# Patient Record
Sex: Female | Born: 1980 | Race: Black or African American | Hispanic: No | Smoking: Never smoker
Health system: Southern US, Community
[De-identification: ages and names within clinical notes are randomized; demographics above are authoritative.]

## PROBLEM LIST (undated history)

## (undated) ENCOUNTER — Inpatient Hospital Stay (HOSPITAL_COMMUNITY): Admission: RE | Payer: BC Managed Care – PPO | Source: Ambulatory Visit

## (undated) ENCOUNTER — Inpatient Hospital Stay (HOSPITAL_COMMUNITY): Payer: Self-pay

## (undated) DIAGNOSIS — Z3490 Encounter for supervision of normal pregnancy, unspecified, unspecified trimester: Secondary | ICD-10-CM

## (undated) DIAGNOSIS — I2699 Other pulmonary embolism without acute cor pulmonale: Secondary | ICD-10-CM

## (undated) DIAGNOSIS — D649 Anemia, unspecified: Secondary | ICD-10-CM

## (undated) DIAGNOSIS — N83209 Unspecified ovarian cyst, unspecified side: Secondary | ICD-10-CM

## (undated) DIAGNOSIS — E119 Type 2 diabetes mellitus without complications: Secondary | ICD-10-CM

## (undated) DIAGNOSIS — B379 Candidiasis, unspecified: Secondary | ICD-10-CM

## (undated) DIAGNOSIS — F329 Major depressive disorder, single episode, unspecified: Secondary | ICD-10-CM

## (undated) DIAGNOSIS — I10 Essential (primary) hypertension: Secondary | ICD-10-CM

## (undated) DIAGNOSIS — R638 Other symptoms and signs concerning food and fluid intake: Secondary | ICD-10-CM

## (undated) DIAGNOSIS — Z8669 Personal history of other diseases of the nervous system and sense organs: Secondary | ICD-10-CM

## (undated) DIAGNOSIS — N943 Premenstrual tension syndrome: Secondary | ICD-10-CM

## (undated) DIAGNOSIS — A599 Trichomoniasis, unspecified: Secondary | ICD-10-CM

## (undated) DIAGNOSIS — Z8742 Personal history of other diseases of the female genital tract: Secondary | ICD-10-CM

## (undated) DIAGNOSIS — F32A Depression, unspecified: Secondary | ICD-10-CM

## (undated) DIAGNOSIS — N926 Irregular menstruation, unspecified: Secondary | ICD-10-CM

## (undated) DIAGNOSIS — A499 Bacterial infection, unspecified: Secondary | ICD-10-CM

## (undated) DIAGNOSIS — F419 Anxiety disorder, unspecified: Secondary | ICD-10-CM

## (undated) DIAGNOSIS — Z8619 Personal history of other infectious and parasitic diseases: Secondary | ICD-10-CM

## (undated) DIAGNOSIS — E559 Vitamin D deficiency, unspecified: Secondary | ICD-10-CM

## (undated) DIAGNOSIS — Z8759 Personal history of other complications of pregnancy, childbirth and the puerperium: Secondary | ICD-10-CM

## (undated) DIAGNOSIS — R51 Headache: Secondary | ICD-10-CM

## (undated) DIAGNOSIS — Z8659 Personal history of other mental and behavioral disorders: Secondary | ICD-10-CM

## (undated) HISTORY — DX: Encounter for supervision of normal pregnancy, unspecified, unspecified trimester: Z34.90

## (undated) HISTORY — DX: Major depressive disorder, single episode, unspecified: F32.9

## (undated) HISTORY — DX: Premenstrual tension syndrome: N94.3

## (undated) HISTORY — DX: Vitamin D deficiency, unspecified: E55.9

## (undated) HISTORY — DX: Personal history of other mental and behavioral disorders: Z86.59

## (undated) HISTORY — DX: Personal history of other diseases of the nervous system and sense organs: Z86.69

## (undated) HISTORY — DX: Other symptoms and signs concerning food and fluid intake: R63.8

## (undated) HISTORY — DX: Other pulmonary embolism without acute cor pulmonale: I26.99

## (undated) HISTORY — DX: Personal history of other complications of pregnancy, childbirth and the puerperium: Z87.59

## (undated) HISTORY — DX: Depression, unspecified: F32.A

## (undated) HISTORY — DX: Candidiasis, unspecified: B37.9

## (undated) HISTORY — DX: Irregular menstruation, unspecified: N92.6

## (undated) HISTORY — DX: Essential (primary) hypertension: I10

## (undated) HISTORY — DX: Unspecified ovarian cyst, unspecified side: N83.209

## (undated) HISTORY — DX: Personal history of other infectious and parasitic diseases: Z86.19

## (undated) HISTORY — DX: Morbid (severe) obesity due to excess calories: E66.01

## (undated) HISTORY — DX: Personal history of other diseases of the female genital tract: Z87.42

## (undated) HISTORY — PX: TOOTH EXTRACTION: SUR596

## (undated) HISTORY — DX: Trichomoniasis, unspecified: A59.9

## (undated) HISTORY — DX: Anxiety disorder, unspecified: F41.9

## (undated) HISTORY — DX: Headache: R51

## (undated) HISTORY — DX: Anemia, unspecified: D64.9

## (undated) HISTORY — DX: Bacterial infection, unspecified: A49.9

---

## 2009-02-26 ENCOUNTER — Emergency Department (HOSPITAL_COMMUNITY)
Admission: EM | Admit: 2009-02-26 | Discharge: 2009-02-26 | Payer: Self-pay | Source: Home / Self Care | Admitting: Emergency Medicine

## 2010-02-04 DIAGNOSIS — N926 Irregular menstruation, unspecified: Secondary | ICD-10-CM

## 2010-02-04 HISTORY — DX: Irregular menstruation, unspecified: N92.6

## 2010-04-23 LAB — BASIC METABOLIC PANEL
CO2: 26 mEq/L (ref 19–32)
Calcium: 9.2 mg/dL (ref 8.4–10.5)
Chloride: 105 mEq/L (ref 96–112)
Potassium: 3.2 mEq/L — ABNORMAL LOW (ref 3.5–5.1)
Sodium: 139 mEq/L (ref 135–145)

## 2010-04-23 LAB — RAPID URINE DRUG SCREEN, HOSP PERFORMED
Amphetamines: NOT DETECTED
Cocaine: NOT DETECTED

## 2010-04-23 LAB — DIFFERENTIAL
Basophils Absolute: 0 10*3/uL (ref 0.0–0.1)
Basophils Relative: 0 % (ref 0–1)
Eosinophils Relative: 1 % (ref 0–5)
Monocytes Absolute: 0.2 10*3/uL (ref 0.1–1.0)

## 2010-04-23 LAB — ETHANOL: Alcohol, Ethyl (B): <5 mg/dL (ref 0–10)

## 2010-04-23 LAB — CBC
Hemoglobin: 13.1 g/dL (ref 12.0–15.0)
MCHC: 33.4 g/dL (ref 30.0–36.0)
RBC: 4.47 MIL/uL (ref 3.87–5.11)

## 2010-12-11 DIAGNOSIS — E559 Vitamin D deficiency, unspecified: Secondary | ICD-10-CM

## 2010-12-11 DIAGNOSIS — Z8742 Personal history of other diseases of the female genital tract: Secondary | ICD-10-CM

## 2010-12-11 DIAGNOSIS — R638 Other symptoms and signs concerning food and fluid intake: Secondary | ICD-10-CM

## 2010-12-11 HISTORY — DX: Other symptoms and signs concerning food and fluid intake: R63.8

## 2010-12-11 HISTORY — DX: Personal history of other diseases of the female genital tract: Z87.42

## 2010-12-11 HISTORY — DX: Vitamin D deficiency, unspecified: E55.9

## 2011-02-05 DIAGNOSIS — I2699 Other pulmonary embolism without acute cor pulmonale: Secondary | ICD-10-CM

## 2011-02-05 HISTORY — DX: Other pulmonary embolism without acute cor pulmonale: I26.99

## 2011-04-09 ENCOUNTER — Encounter (INDEPENDENT_AMBULATORY_CARE_PROVIDER_SITE_OTHER): Payer: BC Managed Care – PPO | Admitting: Obstetrics and Gynecology

## 2011-04-09 DIAGNOSIS — N943 Premenstrual tension syndrome: Secondary | ICD-10-CM

## 2011-04-09 DIAGNOSIS — E559 Vitamin D deficiency, unspecified: Secondary | ICD-10-CM

## 2011-04-09 HISTORY — DX: Premenstrual tension syndrome: N94.3

## 2011-07-31 ENCOUNTER — Emergency Department (HOSPITAL_COMMUNITY): Payer: BC Managed Care – PPO

## 2011-07-31 ENCOUNTER — Inpatient Hospital Stay (HOSPITAL_COMMUNITY)
Admission: EM | Admit: 2011-07-31 | Discharge: 2011-08-02 | DRG: 886 | Disposition: A | Discharge status: Home / Self Care | Payer: BC Managed Care – PPO | Attending: Internal Medicine | Admitting: Internal Medicine

## 2011-07-31 ENCOUNTER — Encounter (HOSPITAL_COMMUNITY): Payer: Self-pay | Admitting: Emergency Medicine

## 2011-07-31 DIAGNOSIS — E669 Obesity, unspecified: Secondary | ICD-10-CM | POA: Diagnosis present

## 2011-07-31 DIAGNOSIS — O88219 Thromboembolism in pregnancy, unspecified trimester: Secondary | ICD-10-CM | POA: Diagnosis present

## 2011-07-31 DIAGNOSIS — Z6841 Body Mass Index (BMI) 40.0 and over, adult: Secondary | ICD-10-CM

## 2011-07-31 DIAGNOSIS — F329 Major depressive disorder, single episode, unspecified: Secondary | ICD-10-CM | POA: Diagnosis present

## 2011-07-31 DIAGNOSIS — R0602 Shortness of breath: Secondary | ICD-10-CM | POA: Diagnosis present

## 2011-07-31 DIAGNOSIS — Z3201 Encounter for pregnancy test, result positive: Secondary | ICD-10-CM

## 2011-07-31 DIAGNOSIS — I10 Essential (primary) hypertension: Secondary | ICD-10-CM

## 2011-07-31 DIAGNOSIS — O24919 Unspecified diabetes mellitus in pregnancy, unspecified trimester: Secondary | ICD-10-CM | POA: Diagnosis present

## 2011-07-31 DIAGNOSIS — R0682 Tachypnea, not elsewhere classified: Secondary | ICD-10-CM

## 2011-07-31 DIAGNOSIS — E282 Polycystic ovarian syndrome: Secondary | ICD-10-CM | POA: Diagnosis present

## 2011-07-31 DIAGNOSIS — E119 Type 2 diabetes mellitus without complications: Secondary | ICD-10-CM | POA: Diagnosis present

## 2011-07-31 DIAGNOSIS — R071 Chest pain on breathing: Secondary | ICD-10-CM

## 2011-07-31 DIAGNOSIS — R000 Tachycardia, unspecified: Secondary | ICD-10-CM

## 2011-07-31 DIAGNOSIS — F3289 Other specified depressive episodes: Secondary | ICD-10-CM | POA: Diagnosis present

## 2011-07-31 DIAGNOSIS — I2699 Other pulmonary embolism without acute cor pulmonale: Secondary | ICD-10-CM

## 2011-07-31 DIAGNOSIS — O99891 Other specified diseases and conditions complicating pregnancy: Principal | ICD-10-CM | POA: Diagnosis present

## 2011-07-31 DIAGNOSIS — O169 Unspecified maternal hypertension, unspecified trimester: Secondary | ICD-10-CM | POA: Diagnosis present

## 2011-07-31 LAB — BASIC METABOLIC PANEL
BUN: 7 mg/dL (ref 6–23)
CO2: 19 mEq/L (ref 19–32)
Chloride: 105 mEq/L (ref 96–112)
GFR calc non Af Amer: >90 mL/min (ref >90)
Glucose, Bld: 135 mg/dL — ABNORMAL HIGH (ref 70–99)
Potassium: 3.2 mEq/L — ABNORMAL LOW (ref 3.5–5.1)
Sodium: 137 mEq/L (ref 135–145)

## 2011-07-31 LAB — URINE MICROSCOPIC-ADD ON

## 2011-07-31 LAB — CBC WITH DIFFERENTIAL/PLATELET
Basophils Absolute: 0 10*3/uL (ref 0.0–0.1)
Basophils Relative: 0 % (ref 0–1)
Eosinophils Absolute: 0 10*3/uL (ref 0.0–0.7)
HCT: 35.3 % — ABNORMAL LOW (ref 36.0–46.0)
MCV: 85.5 fL (ref 78.0–100.0)
Monocytes Absolute: 0.3 10*3/uL (ref 0.1–1.0)
Monocytes Relative: 6 % (ref 3–12)
Neutro Abs: 2.9 10*3/uL (ref 1.7–7.7)
Platelets: 236 10*3/uL (ref 150–400)
RBC: 4.13 MIL/uL (ref 3.87–5.11)
WBC: 5.1 10*3/uL (ref 4.0–10.5)

## 2011-07-31 LAB — URINALYSIS, ROUTINE W REFLEX MICROSCOPIC
Protein, ur: 30 mg/dL — AB
Urobilinogen, UA: 1 mg/dL (ref 0.0–1.0)

## 2011-07-31 LAB — HCG, QUANTITATIVE, PREGNANCY: hCG, Beta Chain, Quant, S: 1389 m[IU]/mL — ABNORMAL HIGH (ref <5)

## 2011-07-31 LAB — GLUCOSE, CAPILLARY

## 2011-07-31 MED ORDER — ENOXAPARIN SODIUM 120 MG/0.8ML ~~LOC~~ SOLN
120.0000 mg | Freq: Two times a day (BID) | SUBCUTANEOUS | Status: DC
  Administered 2011-07-31: 120 mg via SUBCUTANEOUS
  Filled 2011-07-31 (×3): qty 0.8

## 2011-07-31 MED ORDER — IOHEXOL 350 MG/ML SOLN
100.0000 mL | Freq: Once | INTRAVENOUS | Status: AC | PRN
  Administered 2011-07-31: 100 mL via INTRAVENOUS

## 2011-07-31 MED ORDER — SODIUM CHLORIDE 0.9 % IJ SOLN: 3.0000 mL | Freq: Two times a day (BID) | INTRAMUSCULAR | Status: DC

## 2011-07-31 MED ORDER — SODIUM CHLORIDE 0.9 % IV SOLN
INTRAVENOUS | Status: DC
  Administered 2011-07-31 – 2011-08-02 (×4): via INTRAVENOUS

## 2011-07-31 MED ORDER — MORPHINE SULFATE 2 MG/ML IJ SOLN: 2.0000 mg | INTRAMUSCULAR | Status: DC | PRN

## 2011-07-31 NOTE — ED Notes (Signed)
Admitting MD at bedside.

## 2011-07-31 NOTE — ED Notes (Signed)
Doppler being performed at bedside.

## 2011-07-31 NOTE — ED Provider Notes (Signed)
History     CSN: [id number]  Arrival date & time 07/31/11  1452   First MD Initiated Contact with Patient 07/31/11 1601      Chief Complaint  Patient presents with  . Possible DVT    . Shortness of Breath    HPI Pt was seen at 1600.  Per pt, c/o gradual onset and persistence of constant RLE "pain" that began approx 4 days ago.  Pt states she took a 3-4 hour car trip to [place] last Thursday (6 days ago) and her symptoms started 2 days after that.  Pt describes the RLE pain as her calf being "tight."  States she has also developed SOB and chest "pain" when she breathes.  Pt was eval by her PMD PTA, sent to the ED for further eval.  Denies palpitations, no cough, no fevers, no back pain, no abd pain, no N/V/D, no rash.    Past Medical History  Diagnosis Date  . Diabetes mellitus     History reviewed. No pertinent past surgical history.   History  Substance Use Topics  . Smoking status: Never Smoker   . Smokeless tobacco: Not on file  . Alcohol Use: Yes     socially    Review of Systems ROS: Statement: All systems negative except as marked or noted in the HPI; Constitutional: Negative for fever and chills. ; ; Eyes: Negative for eye pain, redness and discharge. ; ; ENMT: Negative for ear pain, hoarseness, nasal congestion, sinus pressure and sore throat. ; ; Cardiovascular: +SOB, CP. Negative for palpitations, diaphoresis, and peripheral edema. ; ; Respiratory: Negative for cough, wheezing and stridor. ; ; Gastrointestinal: Negative for nausea, vomiting, diarrhea, abdominal pain, blood in stool, hematemesis, jaundice and rectal bleeding. . ; ; Genitourinary: Negative for dysuria, flank pain and hematuria. ; ; Musculoskeletal: +right calf pain. Negative for back pain and neck pain. Negative for trauma.; ; Skin: Negative for pruritus, rash, abrasions, blisters, bruising and skin lesion.; ; Neuro: Negative for headache, lightheadedness and neck stiffness. Negative for weakness, altered  level of consciousness , altered mental status, extremity weakness, paresthesias, involuntary movement, seizure and syncope.     Allergies  Review of patient's allergies indicates no known allergies.  Home Medications   Current Outpatient Rx  Name Route Sig Dispense Refill  . FLUOXETINE HCL 20 MG PO TABS Oral Take 20 mg by mouth daily.    Marland Kitchen METFORMIN HCL 500 MG PO TABS Oral Take 500 mg by mouth 3 (three) times daily.      BP 161/93  Pulse 111  SpO2 98%  LMP 06/24/2011  Physical Exam 1605: Physical examination:  Nursing notes reviewed; Vital signs and O2 SAT reviewed;  Constitutional: Well developed, Well nourished, Well hydrated, In no acute distress; Head:  Normocephalic, atraumatic; Eyes: EOMI, PERRL, No scleral icterus; ENMT: Mouth and pharynx normal, Mucous membranes moist; Neck: Supple, Full range of motion, No lymphadenopathy; Cardiovascular: Tachycardic rate and rhythm, No gallop; Respiratory: Breath sounds clear & equal bilaterally, No wheezes.  Speaking full sentences with ease, Normal respiratory effort/excursion; Chest: Nontender, Movement normal; Abdomen: Soft, Nontender, Nondistended, Normal bowel sounds;; Extremities: Pulses normal, +right calf TTP without erythema, edema or ecchymosis.  RLE muscle compartments soft.  NT right knee/ankle/foot.  No calf edema or asymmetry.; Neuro: AA&Ox3, Major CN grossly intact.  Speech clear. No gross focal motor or sensory deficits in extremities.; Skin: Color normal, Warm, Dry.   ED Course  Procedures   MDM  MDM Reviewed: nursing note and  vitals Reviewed previous: ECG Interpretation: labs, ECG, x-ray and CT scan    Date: 07/31/2011  Rate: 104  Rhythm: sinus tachycardia  QRS Axis: normal  Intervals: normal  ST/T Wave abnormalities: normal  Conduction Disutrbances:none  Narrative Interpretation:   Old EKG Reviewed: unchanged; no significant changes from previous EKG dated 02/26/2009.  Results for orders placed during the  hospital encounter of 07/31/11  BASIC METABOLIC PANEL      Component Value Range   Sodium 137  135 - 145 mEq/L   Potassium 3.2 (*) 3.5 - 5.1 mEq/L   Chloride 105  96 - 112 mEq/L   CO2 19  19 - 32 mEq/L   Glucose, Bld 135 (*) 70 - 99 mg/dL   BUN 7  6 - 23 mg/dL   Creatinine, Ser 2.95  0.50 - 1.10 mg/dL   Calcium 9.4  8.4 - 62.1 mg/dL   GFR calc non Af Amer >90  >90 mL/min   GFR calc Af Amer >90  >90 mL/min  CBC WITH DIFFERENTIAL      Component Value Range   WBC 5.1  4.0 - 10.5 K/uL   RBC 4.13  3.87 - 5.11 MIL/uL   Hemoglobin 11.4 (*) 12.0 - 15.0 g/dL   HCT 30.8 (*) 65.7 - 84.6 %   MCV 85.5  78.0 - 100.0 fL   MCH 27.6  26.0 - 34.0 pg   MCHC 32.3  30.0 - 36.0 g/dL   RDW 96.2  95.2 - 84.1 %   Platelets 236  150 - 400 K/uL   Neutrophils Relative 56  43 - 77 %   Neutro Abs 2.9  1.7 - 7.7 K/uL   Lymphocytes Relative 37  12 - 46 %   Lymphs Abs 1.9  0.7 - 4.0 K/uL   Monocytes Relative 6  3 - 12 %   Monocytes Absolute 0.3  0.1 - 1.0 K/uL   Eosinophils Relative 0  0 - 5 %   Eosinophils Absolute 0.0  0.0 - 0.7 K/uL   Basophils Relative 0  0 - 1 %   Basophils Absolute 0.0  0.0 - 0.1 K/uL  URINALYSIS, ROUTINE W REFLEX MICROSCOPIC      Component Value Range   Color, Urine YELLOW  YELLOW   APPearance CLOUDY (*) CLEAR   Specific Gravity, Urine 1.035 (*) 1.005 - 1.030   pH 5.5  5.0 - 8.0   Glucose, UA NEGATIVE  NEGATIVE mg/dL   Hgb urine dipstick NEGATIVE  NEGATIVE   Bilirubin Urine SMALL (*) NEGATIVE   Ketones, ur TRACE (*) NEGATIVE mg/dL   Protein, ur 30 (*) NEGATIVE mg/dL   Urobilinogen, UA 1.0  0.0 - 1.0 mg/dL   Nitrite NEGATIVE  NEGATIVE   Leukocytes, UA SMALL (*) NEGATIVE  PREGNANCY, URINE      Component Value Range   Preg Test, Ur POSITIVE (*) NEGATIVE  GLUCOSE, CAPILLARY      Component Value Range   Glucose-Capillary 143 (*) 70 - 99 mg/dL   Comment 1 Documented in Chart     Comment 2 Notify RN    URINE MICROSCOPIC-ADD ON      Component Value Range   Squamous Epithelial  / LPF RARE  RARE   WBC, UA 3-6  <3 WBC/hpf   RBC / HPF 0-2  <3 RBC/hpf   Bacteria, UA RARE  RARE   Crystals CA OXALATE CRYSTALS (*) NEGATIVE  HCG, QUANTITATIVE, PREGNANCY      Component Value Range   hCG, Beta  Chain, Sharene Butters, S 1389 (*) <5 mIU/mL  TROPONIN I      Component Value Range   Troponin I <0.30  <0.30 ng/mL   Dg Chest 2 View 07/31/2011  *RADIOLOGY REPORT*  Clinical Data: Cough, calf pain  CHEST - 2 VIEW  Comparison:  None.  Findings:  The heart size and mediastinal contours are within normal limits.  Both lungs are clear.  The visualized skeletal structures are unremarkable.  IMPRESSION: No active cardiopulmonary disease.  Original Report Authenticated By: Judie Petit. Ruel Favors, M.D.   Ct Angio Chest W/cm &/or Wo Cm 07/31/2011  *RADIOLOGY REPORT*  Clinical Data: Leg pain.  Swelling in the calf.  Shortness of breath.  Pregnancy.  CT ANGIOGRAPHY CHEST  Technique:  Multidetector CT imaging of the chest using the standard protocol during bolus administration of intravenous contrast. Multiplanar reconstructed images including MIPs were obtained and reviewed to evaluate the vascular anatomy. Abdominal shielding was utilized further reduces fetal exposure.  Contrast: OMNIPAQUE IOHEXOL 350 MG/ML SOLN  Comparison: 07/31/2011  Findings: Patient body habitus and delayed contrast bolus are noted, with various patchy hypodensities in the pulmonary arteries of both lower lobes which seem artifactual but which dramatically reduced sensitivity for acute pulmonary embolus.  Because of the patient's pregnancy, repeat radiation exposure is not an option. The contrast bolus is better in the upper lobes, and no upper lobe embolus is seen.  The lungs appear clear.  An explanation for shortness of breath is not seen.  No thoracic adenopathy is observed.  IMPRESSION:  1.  Sensitivity of today's exam is markedly low due to patient body habitus and late bolus resulting in heterogeneous contrast density in the lower lobe  pulmonary arteries.  Some of the hypodensities in the pulmonary arteries, such as in the left lower lobe pulmonary artery on image 38 of series 4, are clearly artifactual.  Some of the hypodensities, particularly in the right lower lobe, could conceivably represent small pulmonary emboli, but are nonspecific. Rescanning the patient with different timing is undesirable from the perspective of radiation exposure to her pregnancy.  If not already performed, lower extremity Doppler examination would be recommended to provide supplementary information. Although overall I doubt that there is acute pulmonary embolus, this particular exam cannot provide diagnostic certitude based on the limitations described above.  Original Report Authenticated By: Dellia Cloud, M.D.   Bilat LE Vasc US negative for DVT.    2100:  Very long d/w pt and multiple family members regarding obtaining CT-A chest to r/o PE (smaller radiation risk to fetus compared to V/Q scan), as she is pregnant (radiation risk to fetus) but is high risk for PE per Wells' criteria (pt does not meet PERC criteria and d-dimer not indicated).  Pt continues tachycardic and tachypneic, but Sats remain 98% R/A.  EKG and troponin normal.  Pt agreed to CT-A, with equivocal results as above; will not re-scan.  Given high risk for PE per Wells', will admit to start anticoagulants.  Dx testing d/w pt and family.  Questions answered.  Verb understanding, agreeable to admit. T/C to [facility] Dr. Conley Rolls, case discussed, including:  HPI, pertinent PM/SHx, VS/PE, dx testing, ED course and treatment:  Agreeable to come to ED for eval to admit.           Laray Anger, DO 08/01/11 2127

## 2011-07-31 NOTE — Progress Notes (Signed)
VASCULAR LAB PRELIMINARY  PRELIMINARY  PRELIMINARY  PRELIMINARY  Right lower extremity venous duplex completed.    Preliminary report:  Right:  No evidence of DVT, superficial thrombosis, or Baker's cyst. NEGATIVE  Heather Vargas,  RVS 07/31/2011, 4:49 PM

## 2011-07-31 NOTE — H&P (Signed)
PCP:   POWELL,ELMIRA, PA   Chief Complaint: Left calf swelling and pain, acute shortness of breath, pleuritic left upper chest pain.   HPI: Heather Vargas is an 31 y.o. female with history of PCOS on metformin, depression on Prozac, obesity, who has been in her usual state of health walking daily without any problem, presents to her company healthcare provider at [employer], complaining of left calf tenderness and swelling, along with acute onset of moderate shortness of breath, and pleuritic left upper chest pain. She said the symptoms are moderate to severe, and with acute onset. In emergency room, she was found to have a positive pregnancy test. After consideration, because of her elevated risks for PE, she accepted the CT pulmonary angiogram risks to her fetus, and proceed to have this test done. Unfortunately he was nondiagnostic, and there was some changes that may represent small pulmonary embolism. No d-dimer was performed, as it likely would be elevated due to her pregnancy, and her Wells risk criteria preclude this test. The Doppler of the lower extremities was negative for any DVT. When I evaluate her, her blood pressure was okay, but her heart rate was 110-115. Hospitalist was asked to admit her for further evaluation of her possible pulmonary embolism in the setting of pregnancy.  Rewiew of Systems:  The patient denies anorexia, fever, weight loss,, vision loss, decreased hearing, hoarseness,, syncope,balance deficits, hemoptysis, abdominal pain, melena, hematochezia, severe indigestion/heartburn, hematuria, incontinence, genital sores, muscle weakness, suspicious skin lesions, transient blindness, difficulty walking, depression, unusual weight change, abnormal bleeding, enlarged lymph nodes, angioedema, and breast masses.   Past Medical History  Diagnosis Date  . Diabetes mellitus     Past Surgical History  Procedure Date  . No past surgeries     Medications:  HOME  MEDS: Prior to Admission medications   Medication Sig Start Date End Date Taking? Authorizing Provider  FLUoxetine (PROZAC) 20 MG tablet Take 20 mg by mouth daily.   Yes Historical Provider, MD  metFORMIN (GLUCOPHAGE) 500 MG tablet Take 500 mg by mouth 3 (three) times daily.   Yes Historical Provider, MD     Allergies:  No Known Allergies  Social History:   reports that she has never smoked. She has never used smokeless tobacco. She reports that she drinks alcohol. She reports that she does not use illicit drugs.  Family History: History reviewed. No pertinent family history.   Physical Exam: Filed Vitals:   07/31/11 1549 07/31/11 1947 07/31/11 2231  BP: 161/93 142/87   Pulse: 111    TempSrc: Oral    Resp:  13   Height:   5\' 2"  (1.575 m)  Weight:   117.935 kg (260 lb)  SpO2: 98% 100%    Blood pressure 142/87, pulse 111, resp. rate 13, height 5\' 2"  (1.575 m), weight 117.935 kg (260 lb), last menstrual period 06/24/2011, SpO2 100.00%.  GEN:  Pleasant and obese person lying in the stretcher in no acute distress; cooperative with exam PSYCH:  alert and oriented x4; does not appear anxious or depressed; affect is appropriate. HEENT: Mucous membranes pink and anicteric; PERRLA; EOM intact; no cervical lymphadenopathy nor thyromegaly or carotid bruit; no JVD; Breasts:: Not examined CHEST WALL: No tenderness CHEST: Normal respiration, clear to auscultation bilaterally. No wheezing HEART: Rapid rate and regular rhythm; no murmurs rubs or gallops BACK: No kyphosis or scoliosis; no CVA tenderness ABDOMEN: Obese, soft non-tender; no masses, no organomegaly, normal abdominal bowel sounds; no pannus; no intertriginous candida. Rectal Exam: Not  done EXTREMITIES: No bone or joint deformity; age-appropriate arthropathy of the hands and knees; no edema; no ulcerations. Genitalia: not examined PULSES: 2+ and symmetric SKIN: Normal hydration no rash or ulceration CNS: Cranial nerves 2-12  grossly intact no focal lateralizing neurologic deficit   Labs & Imaging Results for orders placed during the hospital encounter of 07/31/11 (from the past 48 hour(s))  HCG, QUANTITATIVE, PREGNANCY     Status: Abnormal   Collection Time   07/31/11  4:09 PM      Component Value Range Comment   hCG, Beta Chain, Quant, S 1389 (*) <5 mIU/mL   TROPONIN I     Status: Normal   Collection Time   07/31/11  4:09 PM      Component Value Range Comment   Troponin I <0.30  <0.30 ng/mL   GLUCOSE, CAPILLARY     Status: Abnormal   Collection Time   07/31/11  4:53 PM      Component Value Range Comment   Glucose-Capillary 143 (*) 70 - 99 mg/dL    Comment 1 Documented in Chart      Comment 2 Notify RN     BASIC METABOLIC PANEL     Status: Abnormal   Collection Time   07/31/11  4:56 PM      Component Value Range Comment   Sodium 137  135 - 145 mEq/L    Potassium 3.2 (*) 3.5 - 5.1 mEq/L    Chloride 105  96 - 112 mEq/L    CO2 19  19 - 32 mEq/L    Glucose, Bld 135 (*) 70 - 99 mg/dL    BUN 7  6 - 23 mg/dL    Creatinine, Ser 8.29  0.50 - 1.10 mg/dL    Calcium 9.4  8.4 - 56.2 mg/dL    GFR calc non Af Amer >90  >90 mL/min    GFR calc Af Amer >90  >90 mL/min   CBC WITH DIFFERENTIAL     Status: Abnormal   Collection Time   07/31/11  4:56 PM      Component Value Range Comment   WBC 5.1  4.0 - 10.5 K/uL    RBC 4.13  3.87 - 5.11 MIL/uL    Hemoglobin 11.4 (*) 12.0 - 15.0 g/dL    HCT 13.0 (*) 86.5 - 46.0 %    MCV 85.5  78.0 - 100.0 fL    MCH 27.6  26.0 - 34.0 pg    MCHC 32.3  30.0 - 36.0 g/dL    RDW 78.4  69.6 - 29.5 %    Platelets 236  150 - 400 K/uL    Neutrophils Relative 56  43 - 77 %    Neutro Abs 2.9  1.7 - 7.7 K/uL    Lymphocytes Relative 37  12 - 46 %    Lymphs Abs 1.9  0.7 - 4.0 K/uL    Monocytes Relative 6  3 - 12 %    Monocytes Absolute 0.3  0.1 - 1.0 K/uL    Eosinophils Relative 0  0 - 5 %    Eosinophils Absolute 0.0  0.0 - 0.7 K/uL    Basophils Relative 0  0 - 1 %    Basophils Absolute  0.0  0.0 - 0.1 K/uL   URINALYSIS, ROUTINE W REFLEX MICROSCOPIC     Status: Abnormal   Collection Time   07/31/11  4:56 PM      Component Value Range Comment   Color, Urine YELLOW  YELLOW    APPearance CLOUDY (*) CLEAR    Specific Gravity, Urine 1.035 (*) 1.005 - 1.030    pH 5.5  5.0 - 8.0    Glucose, UA NEGATIVE  NEGATIVE mg/dL    Hgb urine dipstick NEGATIVE  NEGATIVE    Bilirubin Urine SMALL (*) NEGATIVE    Ketones, ur TRACE (*) NEGATIVE mg/dL    Protein, ur 30 (*) NEGATIVE mg/dL    Urobilinogen, UA 1.0  0.0 - 1.0 mg/dL    Nitrite NEGATIVE  NEGATIVE    Leukocytes, UA SMALL (*) NEGATIVE   PREGNANCY, URINE     Status: Abnormal   Collection Time   07/31/11  4:56 PM      Component Value Range Comment   Preg Test, Ur POSITIVE (*) NEGATIVE   URINE MICROSCOPIC-ADD ON     Status: Abnormal   Collection Time   07/31/11  4:56 PM      Component Value Range Comment   Squamous Epithelial / LPF RARE  RARE    WBC, UA 3-6  <3 WBC/hpf    RBC / HPF 0-2  <3 RBC/hpf    Bacteria, UA RARE  RARE    Crystals CA OXALATE CRYSTALS (*) NEGATIVE    Dg Chest 2 View  07/31/2011  *RADIOLOGY REPORT*  Clinical Data: Cough, calf pain  CHEST - 2 VIEW  Comparison:  None.  Findings:  The heart size and mediastinal contours are within normal limits.  Both lungs are clear.  The visualized skeletal structures are unremarkable.  IMPRESSION: No active cardiopulmonary disease.  Original Report Authenticated By: Judie Petit. Ruel Favors, M.D.   Ct Angio Chest W/cm &/or Wo Cm  07/31/2011  *RADIOLOGY REPORT*  Clinical Data: Leg pain.  Swelling in the calf.  Shortness of breath.  Pregnancy.  CT ANGIOGRAPHY CHEST  Technique:  Multidetector CT imaging of the chest using the standard protocol during bolus administration of intravenous contrast. Multiplanar reconstructed images including MIPs were obtained and reviewed to evaluate the vascular anatomy. Abdominal shielding was utilized further reduces fetal exposure.  Contrast: OMNIPAQUE  IOHEXOL 350 MG/ML SOLN  Comparison: 07/31/2011  Findings: Patient body habitus and delayed contrast bolus are noted, with various patchy hypodensities in the pulmonary arteries of both lower lobes which seem artifactual but which dramatically reduced sensitivity for acute pulmonary embolus.  Because of the patient's pregnancy, repeat radiation exposure is not an option. The contrast bolus is better in the upper lobes, and no upper lobe embolus is seen.  The lungs appear clear.  An explanation for shortness of breath is not seen.  No thoracic adenopathy is observed.  IMPRESSION:  1.  Sensitivity of today's exam is markedly low due to patient body habitus and late bolus resulting in heterogeneous contrast density in the lower lobe pulmonary arteries.  Some of the hypodensities in the pulmonary arteries, such as in the left lower lobe pulmonary artery on image 38 of series 4, are clearly artifactual.  Some of the hypodensities, particularly in the right lower lobe, could conceivably represent small pulmonary emboli, but are nonspecific. Rescanning the patient with different timing is undesirable from the perspective of radiation exposure to her pregnancy.  If not already performed, lower extremity Doppler examination would be recommended to provide supplementary information. Although overall I doubt that there is acute pulmonary embolus, this particular exam cannot provide diagnostic certitude based on the limitations described above.  Original Report Authenticated By: Dellia Cloud, M.D.      Assessment Present on  Admission:  .Chest pain on breathing .SOB (shortness of breath) .PCOS (polycystic ovarian syndrome)   PLAN:  Her clinical story is very concerning for possible pulmonary embolism. Unfortunately her CT pulmonary angiogram wasn't conclusive. Her mother and she are discussing about whether or not to continue with her pregnancy. She is still equivocal about this. I have recommended that she be  admitted to the hospital, and start on Lovenox. Lovenox has lower incidence of bleeding in the fetus and is category B. IV heparin  is category C. and obviously, Coumadin is category X. (contraindication in pregnancy due to fetal bleed and its teratogenicity).  I spoke with the radiologist as well, and was informed that a ventilation/perfusion scan would give more radiation risk to the fetus. If a more definitive diagnosis is sought, then he recommended to repeat a CT pulmonary angiogram in 1-2 days, if her creatinine is in acceptable range.  This should be in conjunction with patient understanding of repeating a CT pulmonary angiogram cause an increased risk to her fetus.  This is balanced against the risk of anticoagulation especially if she did not have a PE.  Again, her clinical history put her in high risk for pulmonary embolism. She is stable, full code, and will be admitted to [facility].   Other plans as per orders.    Heather Vargas 07/31/2011, 11:06 PM

## 2011-07-31 NOTE — Progress Notes (Signed)
VASCULAR LAB PRELIMINARY  PRELIMINARY  PRELIMINARY  PRELIMINARY  Left lower extremity venous duplex completed.    Preliminary report: Left leg is negative for deep and superficial vein thrombosis.   Heather Vargas,   RVT 07/31/2011, 9:04 PM

## 2011-07-31 NOTE — ED Notes (Signed)
Vascular tech at [facility]. Coming to see pt shortly.

## 2011-07-31 NOTE — ED Notes (Signed)
Pt sent over from MD at [facility] to be evaluated for possible DVT. Pt states that she was on a 3-4 hour trip to [place] and leg has been in pain since. Notice swelling in calf area. Also complains of SOB.

## 2011-08-01 DIAGNOSIS — R071 Chest pain on breathing: Secondary | ICD-10-CM

## 2011-08-01 DIAGNOSIS — R0602 Shortness of breath: Secondary | ICD-10-CM

## 2011-08-01 DIAGNOSIS — I517 Cardiomegaly: Secondary | ICD-10-CM

## 2011-08-01 DIAGNOSIS — I10 Essential (primary) hypertension: Secondary | ICD-10-CM

## 2011-08-01 DIAGNOSIS — Z3201 Encounter for pregnancy test, result positive: Secondary | ICD-10-CM

## 2011-08-01 LAB — CBC
Hemoglobin: 10.7 g/dL — ABNORMAL LOW (ref 12.0–15.0)
MCHC: 32.5 g/dL (ref 30.0–36.0)
RBC: 3.83 MIL/uL — ABNORMAL LOW (ref 3.87–5.11)

## 2011-08-01 LAB — GLUCOSE, CAPILLARY
Glucose-Capillary: 103 mg/dL — ABNORMAL HIGH (ref 70–99)
Glucose-Capillary: 112 mg/dL — ABNORMAL HIGH (ref 70–99)
Glucose-Capillary: 116 mg/dL — ABNORMAL HIGH (ref 70–99)
Glucose-Capillary: 118 mg/dL — ABNORMAL HIGH (ref 70–99)

## 2011-08-01 LAB — BASIC METABOLIC PANEL
GFR calc Af Amer: >90 mL/min (ref >90)
GFR calc non Af Amer: >90 mL/min (ref >90)
Potassium: 3 mEq/L — ABNORMAL LOW (ref 3.5–5.1)
Sodium: 137 mEq/L (ref 135–145)

## 2011-08-01 LAB — HEMOGLOBIN A1C: Hgb A1c MFr Bld: 6 % — ABNORMAL HIGH (ref <5.7)

## 2011-08-01 MED ORDER — ZOLPIDEM TARTRATE 5 MG PO TABS
5.0000 mg | ORAL_TABLET | Freq: Every evening | ORAL | Status: DC | PRN
  Administered 2011-08-01: 5 mg via ORAL
  Filled 2011-08-01: qty 1

## 2011-08-01 MED ORDER — ACETAMINOPHEN 325 MG PO TABS
650.0000 mg | ORAL_TABLET | Freq: Four times a day (QID) | ORAL | Status: DC | PRN
  Administered 2011-08-01 (×3): 650 mg via ORAL
  Filled 2011-08-01 (×3): qty 2

## 2011-08-01 MED ORDER — POTASSIUM CHLORIDE CRYS ER 20 MEQ PO TBCR
40.0000 meq | EXTENDED_RELEASE_TABLET | ORAL | Status: AC
  Administered 2011-08-01 (×2): 40 meq via ORAL
  Filled 2011-08-01 (×2): qty 2

## 2011-08-01 MED ORDER — LABETALOL HCL 100 MG PO TABS
100.0000 mg | ORAL_TABLET | Freq: Two times a day (BID) | ORAL | Status: DC
  Administered 2011-08-01 – 2011-08-02 (×3): 100 mg via ORAL
  Filled 2011-08-01 (×5): qty 1

## 2011-08-01 MED ORDER — ENOXAPARIN SODIUM 150 MG/ML ~~LOC~~ SOLN
130.0000 mg | Freq: Two times a day (BID) | SUBCUTANEOUS | Status: DC
  Administered 2011-08-01 – 2011-08-02 (×3): 130 mg via SUBCUTANEOUS
  Filled 2011-08-01 (×4): qty 1

## 2011-08-01 MED ORDER — ENOXAPARIN (LOVENOX) PATIENT EDUCATION KIT
PACK | Freq: Once | Status: AC
  Administered 2011-08-01: 20:00:00
  Filled 2011-08-01: qty 1

## 2011-08-01 MED ORDER — INSULIN ASPART 100 UNIT/ML ~~LOC~~ SOLN: 0.0000 [IU] | Freq: Three times a day (TID) | SUBCUTANEOUS | Status: DC

## 2011-08-01 MED ORDER — ENSURE COMPLETE PO LIQD
237.0000 mL | Freq: Two times a day (BID) | ORAL | Status: DC
  Administered 2011-08-01 – 2011-08-02 (×2): 237 mL via ORAL

## 2011-08-01 NOTE — Progress Notes (Signed)
[facility] PROGRESS NOTE  Heather Keyworth ZOX:[id number] DOB: [birth year] DOA: 07/31/2011 PCP: Henreitta Leber, PA  Brief narrative: Heather Vargas is an 31 y.o. female with history of PCOS on metformin, depression on Prozac, obesity, who has been in her usual state of health walking daily without any problem presents 07/31/11 complaining of left calf tenderness and swelling, along with acute onset of moderate shortness of breath, and pleuritic left upper chest pain.  In emergency room, she was found to have a positive pregnancy test. After consideration, because of her elevated risks for PE, she accepted the CT pulmonary angiogram risks to her fetus, and proceed to have this test done. Unfortunately it was nondiagnostic, and there was some changes that may represent small pulmonary embolism. No d-dimer was performed, as it likely would be elevated due to her pregnancy, and her Wells risk criteria preclude this test. The Doppler of the lower extremities was negative for any DVT. Admitted to tele for  further evaluation of her possible pulmonary embolism in the setting of pregnancy   Consultants:    Procedures:    Antibiotics:    HPI/Subjective: Awake alert. Sitting up in bed. Denies CP/SOB at rest. No events during night. Complained rt calf "tightness"  Objective: Filed Vitals:   07/31/11 2231 07/31/11 2313 07/31/11 2340 08/01/11 0509  BP:  137/89 152/103 148/99  Pulse:  112 110 100  Temp:  98.8 F (37.1 C) 98.1 F (36.7 C) 98.2 F (36.8 C)  TempSrc:  Oral Oral Oral  Resp:  20 20 18   Height: 5\' 2"  (1.575 m)  5\' 2"  (1.575 m)   Weight: 117.935 kg (260 lb)  130.545 kg (287 lb 12.8 oz)   SpO2:  98% 97% 96%    Intake/Output Summary (Last 24 hours) at 08/01/11 0957 Last data filed at 08/01/11 0851  Gross per 24 hour  Intake    480 ml  Output    150 ml  Net    330 ml    Exam:   General:  Awake, alert oriented x3. Well nourished NAD  Cardiovascular: tachycardic, regular,  no MGR trace LEE PPP  Respiratory: normal effort. BSCTAB, no wheeze, rhonci  Abdomen: obese, soft, +BS. Non-tender to palpation  Data Reviewed: Basic Metabolic Panel:  Lab 08/01/11 8119 07/31/11 1656  NA 137 137  K 3.0* 3.2*  CL 106 105  CO2 20 19  GLUCOSE 107* 135*  BUN 5* 7  CREATININE 0.70 0.76  CALCIUM 8.7 9.4  MG -- --  PHOS -- --   Liver Function Tests: No results found for this basename: AST:5,ALT:5,ALKPHOS:5,BILITOT:5,PROT:5,ALBUMIN:5 in the last 168 hours No results found for this basename: LIPASE:5,AMYLASE:5 in the last 168 hours No results found for this basename: AMMONIA:5 in the last 168 hours CBC:  Lab 08/01/11 0420 07/31/11 1656  WBC 5.2 5.1  NEUTROABS -- 2.9  HGB 10.7* 11.4*  HCT 32.9* 35.3*  MCV 85.9 85.5  PLT 210 236   Cardiac Enzymes:  Lab 07/31/11 1609  CKTOTAL --  CKMB --  CKMBINDEX --  TROPONINI <0.30   BNP: No components found with this basename: POCBNP:5 CBG:  Lab 08/01/11 0025 07/31/11 1653  GLUCAP 116* 143*    No results found for this or any previous visit (from the past 240 hour(s)).   Studies: Dg Chest 2 View  07/31/2011  *RADIOLOGY REPORT*  Clinical Data: Cough, calf pain  CHEST - 2 VIEW  Comparison:  None.  Findings:  The heart size and mediastinal contours are within normal  limits.  Both lungs are clear.  The visualized skeletal structures are unremarkable.  IMPRESSION: No active cardiopulmonary disease.  Original Report Authenticated By: Judie Petit. Ruel Favors, M.D.   Ct Angio Chest W/cm &/or Wo Cm  07/31/2011  *RADIOLOGY REPORT*  Clinical Data: Leg pain.  Swelling in the calf.  Shortness of breath.  Pregnancy.  CT ANGIOGRAPHY CHEST  Technique:  Multidetector CT imaging of the chest using the standard protocol during bolus administration of intravenous contrast. Multiplanar reconstructed images including MIPs were obtained and reviewed to evaluate the vascular anatomy. Abdominal shielding was utilized further reduces fetal exposure.   Contrast: OMNIPAQUE IOHEXOL 350 MG/ML SOLN  Comparison: 07/31/2011  Findings: Patient body habitus and delayed contrast bolus are noted, with various patchy hypodensities in the pulmonary arteries of both lower lobes which seem artifactual but which dramatically reduced sensitivity for acute pulmonary embolus.  Because of the patient's pregnancy, repeat radiation exposure is not an option. The contrast bolus is better in the upper lobes, and no upper lobe embolus is seen.  The lungs appear clear.  An explanation for shortness of breath is not seen.  No thoracic adenopathy is observed.  IMPRESSION:  1.  Sensitivity of today's exam is markedly low due to patient body habitus and late bolus resulting in heterogeneous contrast density in the lower lobe pulmonary arteries.  Some of the hypodensities in the pulmonary arteries, such as in the left lower lobe pulmonary artery on image 38 of series 4, are clearly artifactual.  Some of the hypodensities, particularly in the right lower lobe, could conceivably represent small pulmonary emboli, but are nonspecific. Rescanning the patient with different timing is undesirable from the perspective of radiation exposure to her pregnancy.  If not already performed, lower extremity Doppler examination would be recommended to provide supplementary information. Although overall I doubt that there is acute pulmonary embolus, this particular exam cannot provide diagnostic certitude based on the limitations described above.  Original Report Authenticated By: Dellia Cloud, M.D.    Scheduled Meds:   . enoxaparin (LOVENOX) injection  130 mg Subcutaneous BID  . potassium chloride  40 mEq Oral Q4H  . sodium chloride  3 mL Intravenous Q12H  . DISCONTD: enoxaparin (LOVENOX) injection  120 mg Subcutaneous BID   Continuous Infusions:   . sodium chloride 125 mL/hr at 08/01/11 0705     Assessment/Plan: 1. Dyspnea with exertion: etiology unclear but high risk PE. CT  inconclusive . Trop neg. 2decho pending. LE dop neg bilaterally.  Continue lovenox as is ok with pregnancy. sats >92% Will check sats with ambulation.  2. Tachycardia: likely related to pregnancy but concern PE as stated above. Range 99-112. Tele with sinus. 3. HTN: not on antihypertensive. Will start labetalol as not contraindicated with pregnancy. No ACE in pregnancy and prefer BB at this point given #2. Monitor 4. DM: holding metformin. CBG range 116-143. Will use SS glycemic control and check A1c.  5. Hypokalemia: will replace and recheck.  Code Status: full Family Communication: pt at bedside Disposition Plan:  To home when medically ready. Will likely need long tern lovenox . Will request CM consult to assist with lovenox at discharge.   Gwenyth Bender, MD  [facility] Pager [phone]  If 7PM-7AM, please contact night-coverage [email or web address] Password [facility] 08/01/2011, 9:57 AM   LOS: 1 day

## 2011-08-01 NOTE — Progress Notes (Signed)
SATURATION QUALIFICATIONS:  Patient Saturations on Room Air at Rest = 100%  Patient Saturations on Room Air while Ambulating =98%  Patient Saturations on 0 Liters of oxygen while Ambulating98 %

## 2011-08-01 NOTE — Progress Notes (Signed)
   CARE MANAGEMENT NOTE 08/01/2011  Patient:  Heather Vargas, Heather Vargas   Account Number:  [id number]  Date Initiated:  08/01/2011  Documentation initiated by:  Jiles Crocker  Subjective/Objective Assessment:   ADMITTED WITH POSSIBLE PE; PREGNANCY     Action/Plan:   PCP: Henreitta Leber, PA;  INDEPENDENT PRIOR TO ADMISSION   Anticipated DC Date:  08/06/2011   Anticipated DC Plan:  HOME/SELF CARE      DC Planning Services  CM consult              Status of service:  In process, will continue to follow Medicare Important Message given?  NA - LOS <3 / Initial given by admissions (If response is "NO", the following Medicare IM given date fields will be blank)  Per UR Regulation:  Reviewed for med. necessity/level of care/duration of stay  Comments:  08/01/2011- B Monesha Monreal RN, BSN, MHA

## 2011-08-01 NOTE — Progress Notes (Signed)
ANTICOAGULATION CONSULT NOTE - Initial Consult  Pharmacy Consult for lovenox Indication: pulmonary embolus/pregnant  No Known Allergies  Patient Measurements: Height: 5\' 2"  (157.5 cm) Weight: 287 lb 12.8 oz (130.545 kg) IBW/kg (Calculated) : 50.1  Heparin Dosing Weight:   Vital Signs: Temp: 98.1 F (36.7 C) (06/26 2340) Temp src: Oral (06/26 2340) BP: 152/103 mmHg (06/26 2340) Pulse Rate: 110  (06/26 2340)  Labs:  Basename 07/31/11 1656 07/31/11 1609  HGB 11.4* --  HCT 35.3* --  PLT 236 --  APTT -- --  LABPROT -- --  INR -- --  HEPARINUNFRC -- --  CREATININE 0.76 --  CKTOTAL -- --  CKMB -- --  TROPONINI -- <0.30    Estimated Creatinine Clearance: 136 ml/min (by C-G formula based on Cr of 0.76).   Medical History: Past Medical History  Diagnosis Date  . Diabetes mellitus     Medications:  Scheduled:    . enoxaparin (LOVENOX) injection  130 mg Subcutaneous BID  . sodium chloride  3 mL Intravenous Q12H  . DISCONTD: enoxaparin (LOVENOX) injection  120 mg Subcutaneous BID    Assessment: Patient with r/o PE.  Patient is pregnant.  Patient given 120mg  sq x1 lovenox in ED based on weight given at that time.  Weight has been updated.  Goal of Therapy:  Enoxaparin dosed based on patient weight and renal function  Monitor platelets by anticoagulation protocol: Yes   Plan:  Now lovenox 130mg  sq q12hr  Aleene Davidson Crowford 08/01/2011,2:23 AM

## 2011-08-01 NOTE — Progress Notes (Signed)
INITIAL ADULT NUTRITION ASSESSMENT Date: 08/01/2011   Time: 3:36 PM  Reason for Assessment: Nutrition risk, pregnant  ASSESSMENT: Female 31 y.o.  Dx: Chest pain on breathing  Hx:  Past Medical History  Diagnosis Date  . Diabetes mellitus     Related Meds:     . enoxaparin (LOVENOX) injection  130 mg Subcutaneous BID  . enoxaparin   Does not apply Once  . insulin aspart  0-15 Units Subcutaneous TID WC  . labetalol  100 mg Oral BID  . potassium chloride  40 mEq Oral Q4H  . sodium chloride  3 mL Intravenous Q12H  . DISCONTD: enoxaparin (LOVENOX) injection  120 mg Subcutaneous BID     Ht: 5\' 2"  (157.5 cm)  Wt: 287 lb 12.8 oz (130.545 kg)  Ideal Wt: 50.1 kg  % Ideal Wt: 261%  Usual Wt: Unknown % Usual Wt: Unknown  Body mass index is 52.64 kg/(m^2)., morbidly obese  Food/Nutrition Related Hx: Patient admitted to the emergency room and found to be pregnant. She reports that her appetite varies, currently appetite is fair with 35% meal completion. She has a history of diabetes, but has never had formal diet education. She has questions about what she should be eating.   Labs:  CMP     Component Value Date/Time   NA 137 08/01/2011 0420   K 3.0* 08/01/2011 0420   CL 106 08/01/2011 0420   CO2 20 08/01/2011 0420   GLUCOSE 107* 08/01/2011 0420   BUN 5* 08/01/2011 0420   CREATININE 0.70 08/01/2011 0420   CALCIUM 8.7 08/01/2011 0420   GFRNONAA >90 08/01/2011 0420   GFRAA >90 08/01/2011 0420    Intake/Output Summary (Last 24 hours) at 08/01/11 1538 Last data filed at 08/01/11 1300  Gross per 24 hour  Intake    720 ml  Output    300 ml  Net    420 ml    Diet Order: Carbohydrate Modified  Supplements/Tube Feeding: None  IVF:    sodium chloride Last Rate: 125 mL/hr at 08/01/11 0705    Estimated Nutritional Needs:   Kcal: 2400-2550 kcal Protein: 105-115 g Fluid: 2.9 L  NUTRITION DIAGNOSIS: -Inadequate oral intake (NI-2.1).  Status: Ongoing  RELATED TO: decreased  appetite  AS EVIDENCE BY: 35% meal completion  MONITORING/EVALUATION(Goals): Patient will meet 90-100% of estimated nutrition needs.  Monitor: Weight status, PO intake, labs  EDUCATION NEEDS: -Education needs addressed  INTERVENTION: 1. Patient educated on carb counting for diabetes. We reviewed foods with carbs, portion size, meal planning, and label reading. Handout provided.  2. Ensure Complete BID   DOCUMENTATION CODES Per approved criteria  -Morbid Obesity    Linnell Fulling [facility] 08/01/2011, 3:36 PM

## 2011-08-01 NOTE — Progress Notes (Signed)
Patient seen and examined by me.  Agree with note by Toya Smothers.  Patient has symptoms highly suspected for a PE- came in after a long car trip to [place] and recent BCP use.  CTA was not conclusive positive or negative.  Patient was also found to be pregnant.  Will place patient on lovenox for treatment.  Would not want to place the fetus at risk with another CTA.  She will be a high risk pregnancy if she plans to continue with the pregnancy.  Spoke at length with family regarding risks and benefits of plan. Case manger to help with lovenox  Marlin Canary D. O.

## 2011-08-01 NOTE — Progress Notes (Signed)
*  PRELIMINARY RESULTS* Echocardiogram 2D Echocardiogram has been performed.  Heather Vargas 08/01/2011, 12:26 PM

## 2011-08-02 ENCOUNTER — Inpatient Hospital Stay (HOSPITAL_COMMUNITY): Payer: BC Managed Care – PPO

## 2011-08-02 DIAGNOSIS — I10 Essential (primary) hypertension: Secondary | ICD-10-CM

## 2011-08-02 DIAGNOSIS — I2699 Other pulmonary embolism without acute cor pulmonale: Secondary | ICD-10-CM

## 2011-08-02 DIAGNOSIS — R071 Chest pain on breathing: Secondary | ICD-10-CM

## 2011-08-02 DIAGNOSIS — Z3201 Encounter for pregnancy test, result positive: Secondary | ICD-10-CM

## 2011-08-02 DIAGNOSIS — R0602 Shortness of breath: Secondary | ICD-10-CM

## 2011-08-02 LAB — MAGNESIUM: Magnesium: 1.6 mg/dL (ref 1.5–2.5)

## 2011-08-02 LAB — GLUCOSE, CAPILLARY: Glucose-Capillary: 109 mg/dL — ABNORMAL HIGH (ref 70–99)

## 2011-08-02 LAB — BASIC METABOLIC PANEL
BUN: 4 mg/dL — ABNORMAL LOW (ref 6–23)
Calcium: 8.5 mg/dL (ref 8.4–10.5)
Chloride: 106 mEq/L (ref 96–112)
Creatinine, Ser: 0.7 mg/dL (ref 0.50–1.10)
GFR calc Af Amer: >90 mL/min (ref >90)

## 2011-08-02 MED ORDER — PRENATAL MULTIVITAMIN CH
1.0000 | ORAL_TABLET | Freq: Every day | ORAL | Status: DC
  Administered 2011-08-02: 1 via ORAL
  Filled 2011-08-02: qty 1

## 2011-08-02 MED ORDER — LABETALOL HCL 100 MG PO TABS: 100.0000 mg | ORAL_TABLET | Freq: Two times a day (BID) | ORAL | Status: DC

## 2011-08-02 MED ORDER — POTASSIUM CHLORIDE CRYS ER 20 MEQ PO TBCR
40.0000 meq | EXTENDED_RELEASE_TABLET | ORAL | Status: AC
  Administered 2011-08-02 (×2): 40 meq via ORAL
  Filled 2011-08-02 (×2): qty 2

## 2011-08-02 MED ORDER — ENOXAPARIN SODIUM 100 MG/ML ~~LOC~~ SOLN: 130.0000 mg | Freq: Two times a day (BID) | SUBCUTANEOUS | Status: DC

## 2011-08-02 MED ORDER — PRENATAL MULTIVITAMIN CH: 1.0000 | ORAL_TABLET | Freq: Every day | ORAL | Status: DC

## 2011-08-02 MED ORDER — TECHNETIUM TO 99M ALBUMIN AGGREGATED
2.7000 | Freq: Once | INTRAVENOUS | Status: AC | PRN
  Administered 2011-08-02: 3 via INTRAVENOUS

## 2011-08-02 NOTE — Discharge Summary (Signed)
Physician Discharge Summary  Patient ID: Heather Vargas MRN: [id number] DOB/AGE: 10/29/1980 29 y.o.  Admit date: 07/31/2011 Discharge date: 08/02/2011  Primary Care Physician:  Henreitta Leber, [place]   Discharge Diagnoses:    Principal Problem:  *Chest pain on breathing Active Problems:  SOB (shortness of breath)  PCOS (polycystic ovarian syndrome)  PE (pulmonary embolism)  HTN (hypertension)   Medication List  As of 08/02/2011 11:35 AM   STOP taking these medications         metFORMIN 500 MG tablet         TAKE these medications         enoxaparin 100 MG/ML injection   Commonly known as: LOVENOX   Inject 1.3 mLs (130 mg total) into the skin 2 (two) times daily.      FLUoxetine 20 MG tablet   Commonly known as: PROZAC   Take 20 mg by mouth daily.      labetalol 100 MG tablet   Commonly known as: NORMODYNE   Take 1 tablet (100 mg total) by mouth 2 (two) times daily.      prenatal multivitamin Tabs   Take 1 tablet by mouth daily.             Disposition and Follow-up: Pt is medically stable and ready for discharge to home.  Follow-up Information    Follow up with Hal Morales, MD on 08/23/2011. (appointment 1:00pm)    Contact information:   [address] [place] [address] [phone]       Please follow up. (Has appointment with Darylene Price PA at [facility] July 1st at 2:15. will need CBC and BMET drawn. )       Please follow up.   Contact information:   [address] [employer]         Consults:  None   Physical exam: General: Awake, alert oriented x3. Well nourished NAD  Cardiovascular: tachycardic, regular, no MGR trace LEE PPP  Respiratory: normal effort. BSCTAB, no wheeze, rhonci  Abdomen: obese, soft, +BS. Non-tender to palpation      Significant Diagnostic Studies:  Dg Chest 2 View  07/31/2011  *RADIOLOGY REPORT*  Clinical Data: Cough, calf  pain  CHEST - 2 VIEW  Comparison:  None.  Findings:  The heart size and mediastinal contours are within normal limits.  Both lungs are clear.  The visualized skeletal structures are unremarkable.  IMPRESSION: No active cardiopulmonary disease.  Original Report Authenticated By: Judie Petit. Ruel Favors, M.D.   Ct Angio Chest W/cm &/or Wo Cm  07/31/2011  *RADIOLOGY REPORT*  Clinical Data: Leg pain.  Swelling in the calf.  Shortness of breath.  Pregnancy.  CT ANGIOGRAPHY CHEST  Technique:  Multidetector CT imaging of the chest using the standard protocol during bolus administration of intravenous contrast. Multiplanar reconstructed images including MIPs were obtained and reviewed to evaluate the vascular anatomy. Abdominal shielding was utilized further reduces fetal exposure.  Contrast: OMNIPAQUE IOHEXOL 350 MG/ML SOLN  Comparison: 07/31/2011  Findings: Patient body habitus and delayed contrast bolus are noted, with various patchy hypodensities in the pulmonary arteries of both lower lobes which seem artifactual but which dramatically reduced sensitivity for acute pulmonary embolus.  Because of the patient's pregnancy, repeat radiation exposure is not an option. The contrast bolus is better in the upper lobes, and no upper lobe embolus is seen.  The lungs appear clear.  An explanation for shortness of breath  is not seen.  No thoracic adenopathy is observed.  IMPRESSION:  1.  Sensitivity of today's exam is markedly low due to patient body habitus and late bolus resulting in heterogeneous contrast density in the lower lobe pulmonary arteries.  Some of the hypodensities in the pulmonary arteries, such as in the left lower lobe pulmonary artery on image 38 of series 4, are clearly artifactual.  Some of the hypodensities, particularly in the right lower lobe, could conceivably represent small pulmonary emboli, but are nonspecific. Rescanning the patient with different timing is undesirable from the perspective of  radiation exposure to her pregnancy.  If not already performed, lower extremity Doppler examination would be recommended to provide supplementary information. Although overall I doubt that there is acute pulmonary embolus, this particular exam cannot provide diagnostic certitude based on the limitations described above.  Original Report Authenticated By: Dellia Cloud, M.D.    Labs Reviewed  BASIC METABOLIC PANEL - Abnormal; Notable for the following:    Potassium 3.2 (*)     Glucose, Bld 135 (*)     All other components within normal limits  CBC WITH DIFFERENTIAL - Abnormal; Notable for the following:    Hemoglobin 11.4 (*)     HCT 35.3 (*)     All other components within normal limits  URINALYSIS, ROUTINE W REFLEX MICROSCOPIC - Abnormal; Notable for the following:    APPearance CLOUDY (*)     Specific Gravity, Urine 1.035 (*)     Bilirubin Urine SMALL (*)     Ketones, ur TRACE (*)     Protein, ur 30 (*)     Leukocytes, UA SMALL (*)     All other components within normal limits  PREGNANCY, URINE - Abnormal; Notable for the following:    Preg Test, Ur POSITIVE (*)     All other components within normal limits  GLUCOSE, CAPILLARY - Abnormal; Notable for the following:    Glucose-Capillary 143 (*)     All other components within normal limits  URINE MICROSCOPIC-ADD ON - Abnormal; Notable for the following:    Crystals CA OXALATE CRYSTALS (*)     All other components within normal limits  HCG, QUANTITATIVE, PREGNANCY - Abnormal; Notable for the following:    hCG, Beta Chain, Quant, S 1389 (*)     All other components within normal limits  BASIC METABOLIC PANEL - Abnormal; Notable for the following:    Potassium 3.0 (*)     Glucose, Bld 107 (*)     BUN 5 (*)     All other components within normal limits  CBC - Abnormal; Notable for the following:    RBC 3.83 (*)     Hemoglobin 10.7 (*)     HCT 32.9 (*)     All other components within normal limits  GLUCOSE, CAPILLARY -  Abnormal; Notable for the following:    Glucose-Capillary 116 (*)     All other components within normal limits  HEMOGLOBIN A1C - Abnormal; Notable for the following:    Hemoglobin A1C 6.0 (*)     Mean Plasma Glucose 126 (*)     All other components within normal limits  GLUCOSE, CAPILLARY - Abnormal; Notable for the following:    Glucose-Capillary 103 (*)     All other components within normal limits  GLUCOSE, CAPILLARY - Abnormal; Notable for the following:    Glucose-Capillary 112 (*)     All other components within normal limits  BASIC METABOLIC PANEL - Abnormal;  Notable for the following:    Potassium 3.1 (*)     Glucose, Bld 105 (*)     BUN 4 (*)     All other components within normal limits  GLUCOSE, CAPILLARY - Abnormal; Notable for the following:    Glucose-Capillary 118 (*)     All other components within normal limits  GLUCOSE, CAPILLARY - Abnormal; Notable for the following:    Glucose-Capillary 109 (*)     All other components within normal limits  TROPONIN I  MAGNESIUM        Dg Chest 2 View  07/31/2011  *RADIOLOGY REPORT*  Clinical Data: Cough, calf pain  CHEST - 2 VIEW  Comparison:  None.  Findings:  The heart size and mediastinal contours are within normal limits.  Both lungs are clear.  The visualized skeletal structures are unremarkable.  IMPRESSION: No active cardiopulmonary disease.  Original Report Authenticated By: Judie Petit. Ruel Favors, M.D.   Ct Angio Chest W/cm &/or Wo Cm  07/31/2011  *RADIOLOGY REPORT*  Clinical Data: Leg pain.  Swelling in the calf.  Shortness of breath.  Pregnancy.  CT ANGIOGRAPHY CHEST  Technique:  Multidetector CT imaging of the chest using the standard protocol during bolus administration of intravenous contrast. Multiplanar reconstructed images including MIPs were obtained and reviewed to evaluate the vascular anatomy. Abdominal shielding was utilized further reduces fetal exposure.  Contrast: OMNIPAQUE IOHEXOL 350 MG/ML SOLN   Comparison: 07/31/2011  Findings: Patient body habitus and delayed contrast bolus are noted, with various patchy hypodensities in the pulmonary arteries of both lower lobes which seem artifactual but which dramatically reduced sensitivity for acute pulmonary embolus.  Because of the patient's pregnancy, repeat radiation exposure is not an option. The contrast bolus is better in the upper lobes, and no upper lobe embolus is seen.  The lungs appear clear.  An explanation for shortness of breath is not seen.  No thoracic adenopathy is observed.  IMPRESSION:  1.  Sensitivity of today's exam is markedly low due to patient body habitus and late bolus resulting in heterogeneous contrast density in the lower lobe pulmonary arteries.  Some of the hypodensities in the pulmonary arteries, such as in the left lower lobe pulmonary artery on image 38 of series 4, are clearly artifactual.  Some of the hypodensities, particularly in the right lower lobe, could conceivably represent small pulmonary emboli, but are nonspecific. Rescanning the patient with different timing is undesirable from the perspective of radiation exposure to her pregnancy.  If not already performed, lower extremity Doppler examination would be recommended to provide supplementary information. Although overall I doubt that there is acute pulmonary embolus, this particular exam cannot provide diagnostic certitude based on the limitations described above.  Original Report Authenticated By: Dellia Cloud, M.D.   Nm Pulmonary Perfusion  08/02/2011  *RADIOLOGY REPORT*  Clinical Data:  Short of breath.  [redacted] weeks pregnant.  Indeterminate CT pulmonary angiogram.  NUCLEAR MEDICINE VENTILATION - PERFUSION LUNG SCAN  Technique:    Perfusion images were obtained in multiple projections after intravenous injection of Tc-75m MAA.  Radiopharmaceuticals: 2.7 mCi Tc-78m MAA.  Comparison: CT PA 06/26 1013  Findings:  Perfusion: There is several small peripheral  perfusion defects in the posterior right lung base as well anterior right upper lobe. Smaller defect within the posterior superior segment of the left upper lobe.  The ventilation portion was avoided in this pregnant patient as a larger dose of xenon would be required in the perfusion then ventilation  sequence.   IMPRESSION:  Several small peripheral perfusion defects within the right lung and to a lesser degree the left lung.  This coupled with the abnormal findings on the comparison CTPA with suspicious tubular filling defects in the right lower lobes is concerning for acute pulmonary embolism.  The findings discussed with Dr. Benjamine Mola on 08/02/2011 at 10:00 a.m.  Original Report Authenticated By: Genevive Bi, M.D.       Brief H and P: For complete details please refer to admission H and P, but in brief  Heather Vargas is an 31 y.o. female with history of PCOS on metformin, depression on Prozac, obesity, who has been in her usual state of health walking daily without any problem, presented 07/31/11 complaining of left calf tenderness and swelling, along with acute onset of moderate shortness of breath, and pleuritic left upper chest pain. Symptoms  moderate to severe, and with acute onset. In emergency room, she was found to have a positive pregnancy test. After consideration, because of her elevated risks for PE, she accepted the CT pulmonary angiogram risks to her fetus, and proceed to have this test done. Unfortunately  was nondiagnostic, and there was some changes that may represent small pulmonary embolism. No d-dimer was performed, as it likely would be elevated due to her pregnancy, and her Wells risk criteria preclude this test. The Doppler of the lower extremities was negative for any DVT. In the ED her blood pressure was okay, but her heart rate was 110-115. Hospitalist was asked to admit her for further evaluation of her possible pulmonary embolism in the setting of pregnancy.    Hospital  Course:   Principal Problem:  *Chest pain on breathing Active Problems:  SOB (shortness of breath)  PCOS (polycystic ovarian syndrome)  PE (pulmonary embolism)  HTN (hypertension)  1. Dyspnea with exertion: Likely PE. Admitted to tele.  CT inconclusive . Trop neg. 2decho with 60-65% EF and mild LVH.  LE dop neg bilaterally.Started on lovenox at admission. VQ scan done 08/01/11 abnormal. Will be discharged with lovenox. Pt educated to self administer. Will stay on lovenox as is drug of choice during pregnancy. At discharge no SOB, CP. Sats >90% at rest and with ambulation on room air  2. Tachycardia: likely related to pregnancy but concern PE as stated above. Resolved at discharge.  Tele with sinus. 3. HTN: not on antihypertensive.Pt started on started on labetalol as not contraindicated with pregnancy. No ACE in pregnancy and prefer BB at this point given #2. Will need OP follow up.  4. DM: holding metformin. CBG controlled during hospitalization.  A1c 6.0 5. Hypokalemia:  Likely related to pregnancy. Replaced. Will need BMET OP.     Time spent on Discharge: 40 minutes  Signed: Gwenyth Bender 08/02/2011, 11:35 AM

## 2011-08-02 NOTE — Discharge Summary (Signed)
Patient seen and examined by me.  Agree with D/C by Toya Smothers.  Patient is complicated with PE and early pregnancy.  Plan to D/C with close follow up with PCP/OB.  Spoke at length with patient about risks of treating to both her and her pregnancy and risks of not treating.  Patient gave herself the shot and feels comfortable with it. Patient is a "pre- diabetic" and was on metformin for PCOS  Marlin Canary D.O.

## 2011-08-02 NOTE — Progress Notes (Signed)
Pt verbalizes understanding of d/c information. Pt has demonstrated injection of lovenox sq for this RN and has no questions about doing it at home. Pt stable and ready for discharge. Awaits transportation. Ginny Forth

## 2011-08-02 NOTE — Progress Notes (Signed)
Talked to patient about follow up medical care, no PCP - patient is agreeable to be seen by the [facility]- apt is August 05, 2011 at 2:15pm with [facility] / blood work is to be done also; clinical information to be faxed to [phone]; Patient is active with [facility] - apt made for August 23, 2011 at 1:30pm with Dr Pennie Rushing; patient is to go to [facility] pharmacy on [address] - pharmacist called and they have lovenox in stock for the patient. Lots of emotional support given; B Lobbyist, BSN, [place]

## 2011-08-06 ENCOUNTER — Telehealth: Payer: Self-pay | Admitting: Oncology

## 2011-08-06 NOTE — Telephone Encounter (Signed)
S/w the pt and she is aware of her new pt appt on 08/12/2011 with dr Welton Flakes.

## 2011-08-06 NOTE — Telephone Encounter (Signed)
Referred by ED Dx-PE

## 2011-08-09 ENCOUNTER — Other Ambulatory Visit: Payer: Self-pay | Admitting: Medical Oncology

## 2011-08-09 DIAGNOSIS — I2699 Other pulmonary embolism without acute cor pulmonale: Secondary | ICD-10-CM

## 2011-08-12 ENCOUNTER — Encounter: Payer: Self-pay | Admitting: Oncology

## 2011-08-12 ENCOUNTER — Telehealth: Payer: Self-pay | Admitting: Oncology

## 2011-08-12 ENCOUNTER — Ambulatory Visit: Payer: BC Managed Care – PPO

## 2011-08-12 ENCOUNTER — Ambulatory Visit (HOSPITAL_BASED_OUTPATIENT_CLINIC_OR_DEPARTMENT_OTHER): Payer: BC Managed Care – PPO | Admitting: Oncology

## 2011-08-12 ENCOUNTER — Other Ambulatory Visit (HOSPITAL_BASED_OUTPATIENT_CLINIC_OR_DEPARTMENT_OTHER): Payer: BC Managed Care – PPO | Admitting: Lab

## 2011-08-12 ENCOUNTER — Ambulatory Visit: Payer: BC Managed Care – PPO | Admitting: Lab

## 2011-08-12 VITALS — BP 118/82 | HR 67 | Temp 97.7°F | Ht 62.0 in | Wt 283.1 lb

## 2011-08-12 DIAGNOSIS — O88819 Other embolism in pregnancy, unspecified trimester: Secondary | ICD-10-CM

## 2011-08-12 DIAGNOSIS — I2699 Other pulmonary embolism without acute cor pulmonale: Secondary | ICD-10-CM

## 2011-08-12 LAB — COMPREHENSIVE METABOLIC PANEL
ALT: 35 U/L (ref 0–35)
AST: 20 U/L (ref 0–37)
CO2: 22 mEq/L (ref 19–32)
Creatinine, Ser: 0.79 mg/dL (ref 0.50–1.10)
Total Bilirubin: 0.4 mg/dL (ref 0.3–1.2)

## 2011-08-12 LAB — CBC WITH DIFFERENTIAL/PLATELET
BASO%: 0.7 % (ref 0.0–2.0)
EOS%: 0.8 % (ref 0.0–7.0)
HCT: 34.5 % — ABNORMAL LOW (ref 34.8–46.6)
LYMPH%: 43.8 % (ref 14.0–49.7)
MCH: 28.1 pg (ref 25.1–34.0)
MCHC: 32.3 g/dL (ref 31.5–36.0)
NEUT%: 45.7 % (ref 38.4–76.8)
Platelets: 220 10*3/uL (ref 145–400)

## 2011-08-12 LAB — PROTIME-INR: Protime: 10.8 Seconds (ref 10.6–13.4)

## 2011-08-12 LAB — HEPARIN ANTI-XA: Heparin LMW: 1.64 IU/mL

## 2011-08-12 NOTE — Progress Notes (Signed)
[facility]  [phone] [phone]    INITIAL HEMATOLOGY CONSULTATION    Referral MD:   Dr. Dierdre Forth  Reason for Referral: 31 year old female with recent diagnosis of pulmonary embolism after a long car drive and patient is approximately [redacted] week pregnant. Patient is being seen for evaluation and management of anticoagulation during pregnancy.  HPI: Patient is a very pleasant 31 year old female with medical history significant for diabetes on 08/01/2011 presented with chest pain on breathing shortness of breath. She was seen in the emergency room and was found to have a pulmonary embolism. She was admitted 2 hospital underwent 2-D echo within normal ejection fraction and mild LVH. She had a CT angiogram that was inconclusive. She had lower extremity Doppler studies performed that were also negative. On admission patient was started on Lovenox. She subsequently on 627 had a V/Q scan performed that was abnormal and consistent with a pulmonary embolism. During admission patient was found to be approximately [redacted] weeks pregnant. She was discharged to home on 08/02/2011 and is now being seen in hematology for evaluation and management of anticoagulation as well as workup for possible primary hypercoagulability. She does have an appointment with Dr. Dierdre Forth for her OB care.  Patient is currently taking Lovenox 130 mg twice a day. She does occasionally experience shortness of breath especially on walking but she has no chest pains no palpitations no nausea vomiting no bleeding.    Past Medical History  Diagnosis Date  . Diabetes mellitus   :    Past Surgical History  Procedure Date  . No past surgeries   :   CURRENT MEDS: Current Outpatient Prescriptions  Medication Sig Dispense Refill  . enoxaparin (LOVENOX) 100 MG/ML injection Inject 1.3 mLs (130 mg total) into the skin 2 (two) times daily.  60 Syringe  3  . labetalol (NORMODYNE) 100  MG tablet Take 1 tablet (100 mg total) by mouth 2 (two) times daily.  60 tablet  0  . Prenatal Vit-Fe Fumarate-FA (PRENATAL MULTIVITAMIN) TABS Take 1 tablet by mouth daily.      Marland Kitchen FLUoxetine (PROZAC) 20 MG tablet Take 20 mg by mouth daily.          No Known Allergies:  No family history on file.:  History   Social History  . Marital Status: Single    Spouse Name: N/A    Number of Children: N/A  . Years of Education: N/A   Occupational History  . Not on file.   Social History Main Topics  . Smoking status: Never Smoker   . Smokeless tobacco: Never Used  . Alcohol Use: Yes     socially  . Drug Use: No  . Sexually Active: Yes   Other Topics Concern  . Not on file   Social History Narrative  . No narrative on file  :  REVIEW OF SYSTEM:  The rest of the 14-point review of sytem was negative.   Exam: Filed Vitals:   08/12/11 1416  BP: 118/82  Pulse: 67  Temp: 97.7 F (36.5 C)    General:  well-nourished in no acute distress.  Eyes:  no scleral icterus.  ENT:  There were no oropharyngeal lesions.  Neck was without thyromegaly.  Lymphatics:  Negative cervical, supraclavicular or axillary adenopathy.  Respiratory: lungs were clear bilaterally without wheezing or crackles.  Cardiovascular:  Regular rate and rhythm, S1/S2, without murmur, rub or gallop.  There was no pedal edema.  GI:  abdomen  was soft, flat, nontender, nondistended, without organomegaly.  Muscoloskeletal:  no spinal tenderness of palpation of vertebral spine.  Skin exam was without echymosis, petichae.  Neuro exam was nonfocal.  Patient was able to get on and off exam table without assistance.  Gait was normal.  Patient was alerted and oriented.  Attention was good.   Language was appropriate.  Mood was normal without depression.  Speech was not pressured.  Thought content was not tangential.    LABS:  Lab Results  Component Value Date   WBC 4.3 08/12/2011   HGB 11.2* 08/12/2011   HCT 34.5* 08/12/2011   PLT  220 08/12/2011   GLUCOSE 105* 08/02/2011   NA 136 08/02/2011   K 3.1* 08/02/2011   CL 106 08/02/2011   CREATININE 0.70 08/02/2011   BUN 4* 08/02/2011   CO2 21 08/02/2011   INR 0.90* 08/12/2011   HGBA1C 6.0* 08/01/2011    Dg Chest 2 View  07/31/2011  *RADIOLOGY REPORT*  Clinical Data: Cough, calf pain  CHEST - 2 VIEW  Comparison:  None.  Findings:  The heart size and mediastinal contours are within normal limits.  Both lungs are clear.  The visualized skeletal structures are unremarkable.  IMPRESSION: No active cardiopulmonary disease.  Original Report Authenticated By: Judie Petit. Ruel Favors, M.D.   Ct Angio Chest W/cm &/or Wo Cm  07/31/2011  *RADIOLOGY REPORT*  Clinical Data: Leg pain.  Swelling in the calf.  Shortness of breath.  Pregnancy.  CT ANGIOGRAPHY CHEST  Technique:  Multidetector CT imaging of the chest using the standard protocol during bolus administration of intravenous contrast. Multiplanar reconstructed images including MIPs were obtained and reviewed to evaluate the vascular anatomy. Abdominal shielding was utilized further reduces fetal exposure.  Contrast: OMNIPAQUE IOHEXOL 350 MG/ML SOLN  Comparison: 07/31/2011  Findings: Patient body habitus and delayed contrast bolus are noted, with various patchy hypodensities in the pulmonary arteries of both lower lobes which seem artifactual but which dramatically reduced sensitivity for acute pulmonary embolus.  Because of the patient's pregnancy, repeat radiation exposure is not an option. The contrast bolus is better in the upper lobes, and no upper lobe embolus is seen.  The lungs appear clear.  An explanation for shortness of breath is not seen.  No thoracic adenopathy is observed.  IMPRESSION:  1.  Sensitivity of today's exam is markedly low due to patient body habitus and late bolus resulting in heterogeneous contrast density in the lower lobe pulmonary arteries.  Some of the hypodensities in the pulmonary arteries, such as in the left lower lobe  pulmonary artery on image 38 of series 4, are clearly artifactual.  Some of the hypodensities, particularly in the right lower lobe, could conceivably represent small pulmonary emboli, but are nonspecific. Rescanning the patient with different timing is undesirable from the perspective of radiation exposure to her pregnancy.  If not already performed, lower extremity Doppler examination would be recommended to provide supplementary information. Although overall I doubt that there is acute pulmonary embolus, this particular exam cannot provide diagnostic certitude based on the limitations described above.  Original Report Authenticated By: Dellia Cloud, M.D.   Nm Pulmonary Perfusion  08/02/2011  *RADIOLOGY REPORT*  Clinical Data:  Short of breath.  [redacted] weeks pregnant.  Indeterminate CT pulmonary angiogram.  NUCLEAR MEDICINE VENTILATION - PERFUSION LUNG SCAN  Technique:    Perfusion images were obtained in multiple projections after intravenous injection of Tc-46m MAA.  Radiopharmaceuticals: 2.7 mCi Tc-8m MAA.  Comparison: CT PA  06/26 1013  Findings:  Perfusion: There is several small peripheral perfusion defects in the posterior right lung base as well anterior right upper lobe. Smaller defect within the posterior superior segment of the left upper lobe.  The ventilation portion was avoided in this pregnant patient as a larger dose of xenon would be required in the perfusion then ventilation sequence.   IMPRESSION:  Several small peripheral perfusion defects within the right lung and to a lesser degree the left lung.  This coupled with the abnormal findings on the comparison CTPA with suspicious tubular filling defects in the right lower lobes is concerning for acute pulmonary embolism.  The findings discussed with Dr. Benjamine Mola on 08/02/2011 at 10:00 a.m.  Original Report Authenticated By: Genevive Bi, M.D.      ASSESSMENT AND PLAN: 31 year old female with pulmonary embolism in the setting of  pregnancy. Recently being admitted to [facility] for a workup of chest pain and shortness of breath and tachycardia. Patient also was incidentally found to be [redacted] week gestational pregnancy. During her hospitalization she was started on anticoagulation with Lovenox 130 mg every 12 hours.  Patient and I discussed workup for hypercoagulability I do think that she should have a full hypoechoic panel performed. The patient had been on birth control pills and she is obese this could be contributing to her risks as well as well as her pregnancy. At this time recommendation is to continue with Lovenox twice a day 130 mg dosage for the duration of her pregnancy. Patient is interested in keeping her pregnancy at this time. She is gong to be seen by Dr. Pennie Rushing for ongoing OB care.  Today we discussed risks and benefits and monitoring of anticoagulation while patient is on Lovenox. If she needs the injections we certainly will be able to her by these for her through her pharmacy.I discussed with the patient that she gets closer to her estimated date of delivery we will have to convert her to unfractionated heparin to get her ready for labor and delivery.  I will plan on seeing the patient back in one month's time.  I spent 1 hour face to face with the patient greater than 50% of the time was spent in coordination of care and counseling.  Drue Second, MD Medical/Oncology [facility] [phone] (beeper) [phone] (Office)  08/12/2011, 4:35 PM

## 2011-08-12 NOTE — Telephone Encounter (Signed)
gve the pt her sept 2013 appt calendar °

## 2011-08-12 NOTE — Patient Instructions (Addendum)
1. Continue Lovenox 130 mg q 12 hours. You will need to take this through out your pregnancy  2. Keep your appointment with your OB  3. I will se you back on 09/23/11 for follow up

## 2011-08-16 LAB — HYPERCOAGULABLE PANEL, COMPREHENSIVE
Beta-2 Glyco I IgG: 0 G Units (ref <20)
DRVVT: 37.7 secs (ref <45.1)
Lupus Anticoagulant: NOT DETECTED
PTTLA Confirmation: 5.8 secs (ref <8.0)
Protein C Activity: 192 % — ABNORMAL HIGH (ref 75–133)
Protein S Activity: 101 % (ref 69–129)

## 2011-08-19 ENCOUNTER — Encounter: Payer: Self-pay | Admitting: Oncology

## 2011-08-19 NOTE — Progress Notes (Signed)
Put fmla form on nurse's desk °

## 2011-08-22 ENCOUNTER — Other Ambulatory Visit: Payer: Self-pay

## 2011-08-22 ENCOUNTER — Telehealth: Payer: Self-pay

## 2011-08-22 ENCOUNTER — Encounter: Payer: Self-pay | Admitting: Oncology

## 2011-08-22 DIAGNOSIS — I2699 Other pulmonary embolism without acute cor pulmonale: Secondary | ICD-10-CM

## 2011-08-22 NOTE — Telephone Encounter (Signed)
Lm on vm to cb per pt's request to see a doctor due to

## 2011-08-22 NOTE — Telephone Encounter (Signed)
Cont from 08/22/11: dx of PE in the hosp on 08/12/11. Appt sched 08/23/11@3 :30p with Dr.Roberts to discuss. Pt has a NOB interview scheduled 08/23/11@2 :30p. Pt referred to MFM for PE per AR and will get a call from their office to sched appt.  Pt agrees and voices understanding.

## 2011-08-22 NOTE — Progress Notes (Signed)
Faxed fmla papers to the [facility] @ [phone]. Per MD she does not feel the patient is disabled, she will not fill out std form.

## 2011-08-23 ENCOUNTER — Ambulatory Visit (INDEPENDENT_AMBULATORY_CARE_PROVIDER_SITE_OTHER): Payer: BC Managed Care – PPO | Admitting: Obstetrics and Gynecology

## 2011-08-23 ENCOUNTER — Ambulatory Visit (INDEPENDENT_AMBULATORY_CARE_PROVIDER_SITE_OTHER): Payer: BC Managed Care – PPO

## 2011-08-23 VITALS — BP 110/70 | Wt 284.0 lb

## 2011-08-23 DIAGNOSIS — I2699 Other pulmonary embolism without acute cor pulmonale: Secondary | ICD-10-CM

## 2011-08-23 DIAGNOSIS — O26849 Uterine size-date discrepancy, unspecified trimester: Secondary | ICD-10-CM

## 2011-08-23 DIAGNOSIS — Z331 Pregnant state, incidental: Secondary | ICD-10-CM

## 2011-08-23 MED ORDER — LABETALOL HCL 100 MG PO TABS: 100.0000 mg | ORAL_TABLET | Freq: Two times a day (BID) | ORAL | Status: DC

## 2011-08-23 NOTE — Progress Notes (Signed)
Ultrasound today consistent with dates with an EDD of 03/30/12 by last menstrual period fetal heart rate is 170 bilateral ovaries are within normal limits PE and HTN on labetalol 100mg  BID Sched new ob w/u and interview Sched referral to MFM for any further recs EFW q4wks starting at 28-30wks secondary to Lovenox and h/o PE  No complaints Many questions answered Pt is thinking about terminating and have many important questions about undergoing procedure and clot I rec she return to hematologist ASAP and if decides to keep preg to keep new ob interview and w/u and we can pursue the recs as discussed - ie MFM referral, change to heparin at 36wks, EFW q4wks at 28-30wks, ante testing at 32wks, baseline 24hr urine with PIH labs secondary to [facility] on meds, possibly genetic testing, early glucola...  RTO for new ob interview and w/u or prn/AEX if terminates

## 2011-08-24 LAB — PRENATAL PANEL VII
Antibody Screen: NEGATIVE
Basophils Absolute: 0 10*3/uL (ref 0.0–0.1)
Eosinophils Absolute: 0.1 10*3/uL (ref 0.0–0.7)
Eosinophils Relative: 1 % (ref 0–5)
Hepatitis B Surface Ag: NEGATIVE
Lymphocytes Relative: 38 % (ref 12–46)
Lymphs Abs: 1.9 10*3/uL (ref 0.7–4.0)
MCH: 28 pg (ref 26.0–34.0)
MCV: 82.4 fL (ref 78.0–100.0)
Neutrophils Relative %: 53 % (ref 43–77)
Platelets: 328 10*3/uL (ref 150–400)
RBC: 3.93 MIL/uL (ref 3.87–5.11)
RDW: 14.8 % (ref 11.5–15.5)
Rubella: 65.5 IU/mL — ABNORMAL HIGH
WBC: 5.1 10*3/uL (ref 4.0–10.5)

## 2011-08-26 ENCOUNTER — Telehealth: Payer: Self-pay

## 2011-08-26 NOTE — Telephone Encounter (Signed)
LM with Melissa. Pt needs ASAP appt for consult and eval of recent pulmonary emboli that she recently had. Pt is currently [redacted] weeks pregnant. I asked her to call me right back to discuss R/S this appt to ASAP, per AR. JO , CMA

## 2011-08-27 LAB — HEMOGLOBINOPATHY EVALUATION
Hemoglobin Other: 0 %
Hgb A: 97.2 % (ref 96.8–97.8)
Hgb F Quant: 0 % (ref 0.0–2.0)
Hgb S Quant: 0 %

## 2011-08-28 ENCOUNTER — Telehealth: Payer: Self-pay | Admitting: Oncology

## 2011-08-28 ENCOUNTER — Telehealth: Payer: Self-pay

## 2011-08-28 NOTE — Telephone Encounter (Signed)
LM with emergency contact for Heather Vargas to have [place] call me re: poss. appt w/ Hematologist Dr. Park Breed for recent + pregnancy test w hx of recent PE. Melody Comas A

## 2011-08-28 NOTE — Telephone Encounter (Signed)
Received message from [facility] @ Dr. Su Hilt office [facility]) requesting that pt be seen by KK sooner than 8/21 due to pt had a recent embolism and is now pregnant. Per [facility]) she can see pt tomorrow 7/25 @ 1pm. lmonvm for pt informing her and asking that she call me back ASAP to confirm appt for 7/25. S/w Annice Pih @ Dr. Su Hilt office she is also aware and will try to call pt also.

## 2011-08-29 ENCOUNTER — Ambulatory Visit (HOSPITAL_BASED_OUTPATIENT_CLINIC_OR_DEPARTMENT_OTHER): Payer: BC Managed Care – PPO | Admitting: Oncology

## 2011-08-29 ENCOUNTER — Ambulatory Visit: Payer: Self-pay | Admitting: Oncology

## 2011-08-29 ENCOUNTER — Encounter: Payer: Self-pay | Admitting: Oncology

## 2011-08-29 VITALS — BP 125/84 | HR 79 | Temp 98.7°F | Ht 62.0 in | Wt 280.5 lb

## 2011-08-29 DIAGNOSIS — I2699 Other pulmonary embolism without acute cor pulmonale: Secondary | ICD-10-CM

## 2011-08-29 NOTE — Patient Instructions (Addendum)
I will see you back at your scheduled appointment

## 2011-08-31 NOTE — Progress Notes (Signed)
ID: Heather Vargas  DOB: 1980/11/04  MR#: [id number]  CSN#: [id number]   Interval History:   Patient is seen in followup today at the request of her gynecologist. Patient is a 31 year old female who is now about [redacted] weeks pregnant she recently was seen back on 08/01/2011 in the emergency room for a pulmonary embolism at which time she was also found to be pregnant. She was placed on Lovenox 130 mg twice a day. She has been seen by her OB for an initial visit since finding out that she was pregnant. Because of the patient's ongoing shortness of breath the patient was referred back to me for discussion of whether the patient still has a pulmonary embolism since she is still symptomatic with shortness of breath but no chest pains. I do think that the other question that is being asked per patient is whether it would be safe at this time for patient to undergo termination of the pregnancy versus the patient carrying the pregnancy to term. Initially I met with the patient by herself but then her fianc also joined [place] in the conversation. He made it clear that there is more likelihood that the patient would be having termination of the pregnancy since they are ready have 5 children. Also they do not wish to put Heather life at risk as this would be a high-risk pregnancy due to the pulmonary embolism.  ROS:  patient continues to have shortness of breath on exertion. She tells me that she becomes very short winded when she walks from her car to her workplace. She is denying having any headaches double vision blurring of vision she has no nausea or vomiting she occasionally develops chest pains. She has not noticed any swelling in her lower extremities she does not have any abdominal pain she has no bleeding no vaginal discharge or bleeding she has no easy bruising or ecchymosis.  No Known Allergies  Current Outpatient Prescriptions  Medication Sig Dispense Refill  . enoxaparin (LOVENOX) 100 MG/ML injection Inject 1.3  mLs (130 mg total) into the skin 2 (two) times daily.  60 Syringe  3  . labetalol (NORMODYNE) 100 MG tablet Take 1 tablet (100 mg total) by mouth 2 (two) times daily.  30 tablet  2  . Prenatal Vit-Fe Fumarate-FA (PRENATAL MULTIVITAMIN) TABS Take 1 tablet by mouth daily.      Marland Kitchen FLUoxetine (PROZAC) 20 MG tablet Take 20 mg by mouth daily.      Marland Kitchen labetalol (NORMODYNE) 100 MG tablet Take 1 tablet (100 mg total) by mouth 2 (two) times daily.  60 tablet  0     Objective:  Filed Vitals:   08/29/11 1308  BP: 125/84  Pulse: 79  Temp: 98.7 F (37.1 C)    BMI: Body mass index is 51.30 kg/(m^2).   ECOG FS: 1  Physical Exam:   Sclerae unicteric  Oropharynx clear  No peripheral adenopathy  Lungs clear -- no rales or rhonchi  Heart regular rate and rhythm  Abdomen benign  MSK no focal spinal tenderness, no peripheral edema  Neuro nonfocal  Breast exam: Deferred  Lab Results:      Chemistry      Component Value Date/Time   NA 137 08/12/2011 1400   K 4.1 08/12/2011 1400   CL 106 08/12/2011 1400   CO2 22 08/12/2011 1400   BUN 7 08/12/2011 1400   CREATININE 0.79 08/12/2011 1400      Component Value Date/Time   CALCIUM 9.5 08/12/2011 1400  ALKPHOS 44 08/12/2011 1400   AST 20 08/12/2011 1400   ALT 35 08/12/2011 1400   BILITOT 0.4 08/12/2011 1400       Lab Results  Component Value Date   WBC 5.1 08/23/2011   HGB 11.0* 08/23/2011   HCT 32.4* 08/23/2011   MCV 82.4 08/23/2011   PLT 328 08/23/2011   NEUTROABS 2.7 08/23/2011    Studies/Results:  Nm Pulmonary Perfusion  08/02/2011  *RADIOLOGY REPORT*  Clinical Data:  Short of breath.  [redacted] weeks pregnant.  Indeterminate CT pulmonary angiogram.  NUCLEAR MEDICINE VENTILATION - PERFUSION LUNG SCAN  Technique:    Perfusion images were obtained in multiple projections after intravenous injection of Tc-27m MAA.  Radiopharmaceuticals: 2.7 mCi Tc-35m MAA.  Comparison: CT PA 06/26 1013  Findings:  Perfusion: There is several small peripheral perfusion defects in the  posterior right lung base as well anterior right upper lobe. Smaller defect within the posterior superior segment of the left upper lobe.  The ventilation portion was avoided in this pregnant patient as a larger dose of xenon would be required in the perfusion then ventilation sequence.   IMPRESSION:  Several small peripheral perfusion defects within the right lung and to a lesser degree the left lung.  This coupled with the abnormal findings on the comparison CTPA with suspicious tubular filling defects in the right lower lobes is concerning for acute pulmonary embolism.  The findings discussed with Dr. Benjamine Mola on 08/02/2011 at 10:00 a.m.  Original Report Authenticated By: Genevive Bi, M.D.    Assessment: 31 year old female with pulmonary embolism and currently [redacted] weeks pregnant. Patient is currently on anticoagulation with Lovenox 130 mg twice a day. Overall she is tolerating it well. However in spite of being on Lovenox patient still continues to be short of breath. I do think that this is multifactorial including her weight possibly the pregnancy and possibly the PE. A question of whether patient should have imaging studies again to demonstrate whether the blood clot has dissolved or not is raised. The patient and I discussed this at 1 along with her fianc. Patient was advised to and counseled that in order for [place] to know exactly whether the clot has dissolved or not we would have to do a CT angiogram. Because we do use radiation and contrast this would expose the unborn fetus at  risks of contrast Leading to possible fetal malformations and even fetal death and miscarriage. During the conversation the fianc and the patient indicated that there may be a possibility that they may terminate the pregnancy but they have not made up their mind. Certainly if they terminate the pregnancy then we could do a VQ scan To see if the clot has resolved. However during the time that patient is pregnant and she has not  made up her mind regarding termination  I would recommend against doing any kind of radiologic studies due to fetal risks.  Plan: Both the patient and her fianc demonstrated understanding. They are going to be discussing the whole situation with her OB to discuss what is the best option. Certainly if they decide to terminate the pregnancy I would recommend that we do a VQ scan to the extent of the blood clots. She certainly would need to be off of anticoagulation for a short duration during the procedure. She certainly could go on unfractionated heparin 48 hours prior to the procedure and once the procedures performed and there is no evidence of excessive bleeding she could go back on Lovenox.  All of this was explained to the patient and her fianc. I certainly will be available to assist in any decisions that they make.  The length of time of the face-to-face encounter was  30    minutes. More than 50% of time was spent counseling and coordination of care.       Drue Second 08/31/2011

## 2011-09-02 ENCOUNTER — Telehealth: Payer: Self-pay | Admitting: Obstetrics and Gynecology

## 2011-09-02 MED ORDER — ACETAMINOPHEN-CODEINE #3 300-30 MG PO TABS: 1.0000 | ORAL_TABLET | Freq: Four times a day (QID) | ORAL | Status: AC | PRN

## 2011-09-02 NOTE — Telephone Encounter (Signed)
Nancy/ob/epic

## 2011-09-02 NOTE — Telephone Encounter (Signed)
TC from pt. States since last PM, has had lower lt-sided abd. Pain.  No relief with Tylenol x 2 and increased water. No UTI sx. Reviewed pt's history by phone with [facility]. Per [facility], offered eval at [facility] or Rx for Tylenol #3.  Pt opts for Rx.   Pt then states has had constipation x a few days.  Took Colace and prune juice this AM, and had BM but no change in pain which is 5-6 on pain scale. States shortness of breath is the same and is tolerable while resting. Increases with exertion. Rx called to pharmacy. Advised if no improvement after one dose or sx increase, to call after hours. Instructions given.  Pt verbalizes comprehension.

## 2011-09-02 NOTE — Telephone Encounter (Signed)
TC to pt to check status. States just took pain medication. Sx have not increased.  Made aware that med can cause constipation. States will call if no improvement.

## 2011-09-03 ENCOUNTER — Ambulatory Visit (HOSPITAL_COMMUNITY)
Admission: RE | Admit: 2011-09-03 | Discharge: 2011-09-03 | Disposition: A | Discharge status: Home / Self Care | Payer: BC Managed Care – PPO | Source: Ambulatory Visit | Attending: Obstetrics and Gynecology | Admitting: Obstetrics and Gynecology

## 2011-09-03 VITALS — BP 109/65 | HR 86 | Wt 277.0 lb

## 2011-09-03 DIAGNOSIS — I2699 Other pulmonary embolism without acute cor pulmonale: Secondary | ICD-10-CM

## 2011-09-03 NOTE — Consult Note (Signed)
Maternal Fetal Medicine Consultation  Requesting Provider(s): Osborn Coho, MD  Reason for consultation: Recent pulmonary embolus  HPI: Heather Vargas is a 31 yo G3P2002 currently at 10 1/7 weeks who is referred for management recommendations due to recent pulmonary embolus.  The patient reports that she was diagnosed with a PE on 07/31/11 during a visit to the ER at which time she found out that she was pregnant.  She is currently on 130 mg Lovinox BID.  She is currently followed by Dr. Welton Flakes from Hematology.  Her thrombophelia / hypercoagulation work up was within normal limits.  She was recently seen due to continued shortness of breath and the risks of repeating CT angiogram was felt to outweigh the benefits.  The patient was initially considering termination of pregnancy, but at this present time plans on continuing the pregnancy as termination will not alter her risk significantly.    Heather Vargas reports that she still has some shortness of breath with activity, but that it overall has improved.  She is otherwise without complaints today.  OB History: OB History    Grav Para Term Preterm Abortions TAB SAB Ect Mult Living   3 2 2       2      Term SVD x 2 without complications.  Reports polyhydramnios with first pregnancy and induction of labor at 39 weeks.  PMH:  Past Medical History  Diagnosis Date  . H/O varicella   . H/O migraine   . Yeast infection   . Bacterial infection   . Trichomonas     as a teen  . Irregular menstrual cycle 2012  . Increased BMI 12/11/10  . Vitamin d deficiency 12/11/10  . History of PCOS 12/11/10  . Diabetes mellitus     Metformin Rx'd by AR;D/C'd by Lucien Mons  . Headache     Migraines;was on Topamax last year;no longer taking  . PMS (premenstrual syndrome) 04/09/11    was Rx'd Prozac  . Anemia     during 1st pregnancy;iron supp taken  . Hypertension     on Labetolol 100mg  bid  . Pulmonary embolism 2013    on Lovenox 130mg  bid    PSH:  Past Surgical  History  Procedure Date  . No past surgeries   . Tooth extraction    Meds: Current outpatient prescriptions:acetaminophen-codeine (TYLENOL #3) 300-30 MG per tablet, Take 1-2 tablets by mouth every 6 (six) hours as needed for pain., Disp: 30 tablet, Rfl: 0;  enoxaparin (LOVENOX) 100 MG/ML injection, Inject 1.3 mLs (130 mg total) into the skin 2 (two) times daily., Disp: 60 Syringe, Rfl: 3;  FLUoxetine (PROZAC) 20 MG tablet, Take 20 mg by mouth daily., Disp: , Rfl:  labetalol (NORMODYNE) 100 MG tablet, Take 1 tablet (100 mg total) by mouth 2 (two) times daily., Disp: 60 tablet, Rfl: 0;  labetalol (NORMODYNE) 100 MG tablet, Take 1 tablet (100 mg total) by mouth 2 (two) times daily., Disp: 30 tablet, Rfl: 2;  Prenatal Vit-Fe Fumarate-FA (PRENATAL MULTIVITAMIN) TABS, Take 1 tablet by mouth daily., Disp: , Rfl:   Allergies: NKDA  FH: neg for birth defects, hereditary disorders or thromboembolism  Soc: denies ETOH/ tobacco use or illicit drug use  Review of Systems: no vaginal bleeding or cramping/contractions, no LOF, no nausea/vomiting. All other systems reviewed and are negative.   A/P: 1) Recent Pulmonary Embolus - recommend continuing anticoagulation throughout the course of pregnancy and for at least 6 weeks post partum.  Recommend continuing Lovinox 130 mg BID  as written.  If the patient is compliant, checking anti-Xa levels is probably not necessary.  If there is any question, would be reasonable to check at least every trimester of the patient's pregnancy.  For the patient to be a candidate for epidural / spinal anesthesia, she would need to be off Lovinox for at least 24 hours at the onset of labor.  Would recommend transitioning to unfractionated Heparin at 36 weeks and scheduling an induction of labor at 39 weeks.  Would start unfractionated Heparin at a dose of 12,000 units TID and titrate dose to achieve a PTT of 1.5 to 2x normal (draw labs 4 hours after dose of unfractionated Heparin).   Would hold AM dose of Heparin during the morning of scheduled induction.  Would resume Lovinox (present dose - 130 mg BID) 6 hours after SVD and 12 hours after Cesarean section.  If the patient will require > 6 weeks of therapy post partum, would consider transitioning the patient to Warfarin which is safe with breastfeeding.  Although risk of osteopenia is low with Lovinox, would recommend Calcium/ Vitamin D supplements.  We briefly discussed termination of pregnancy - although pregnancy is considered a hypercoagulable state, would not anticipate that termination would alter her risk significantly and she would still require anticoagulation whether pregnant or not.  2) Chronic hypertension - currently well-controlled on current dose of Labetolol.  Recommend baseline preeclampsia labs and 24-hr urine.  Recommend serial growth scan in the 3rd trimester given both chronic hypertension and hx of PE.  Recommend antepartum fetal testing (NSTs) beginning at 32 weeks.  3) PCOs/ ? Diabetes - currently not on medications - recommend early OGTT.   Thank you for the opportunity to be a part of the care of Heather Vargas. Please contact our office if we can be of further assistance.   I spent approximately 30 minutes with this patient with over 50% of time spent in face-to-face counseling.   Alpha Gula, MD

## 2011-09-10 ENCOUNTER — Ambulatory Visit (INDEPENDENT_AMBULATORY_CARE_PROVIDER_SITE_OTHER): Payer: BC Managed Care – PPO

## 2011-09-10 ENCOUNTER — Ambulatory Visit (INDEPENDENT_AMBULATORY_CARE_PROVIDER_SITE_OTHER): Payer: Medicaid Other

## 2011-09-10 DIAGNOSIS — O358XX0 Maternal care for other (suspected) fetal abnormality and damage, not applicable or unspecified: Secondary | ICD-10-CM

## 2011-09-10 DIAGNOSIS — Z331 Pregnant state, incidental: Secondary | ICD-10-CM

## 2011-09-10 DIAGNOSIS — Z8742 Personal history of other diseases of the female genital tract: Secondary | ICD-10-CM

## 2011-09-10 DIAGNOSIS — Z8759 Personal history of other complications of pregnancy, childbirth and the puerperium: Secondary | ICD-10-CM

## 2011-09-10 DIAGNOSIS — I10 Essential (primary) hypertension: Secondary | ICD-10-CM

## 2011-09-10 DIAGNOSIS — E282 Polycystic ovarian syndrome: Secondary | ICD-10-CM

## 2011-09-10 DIAGNOSIS — Z8659 Personal history of other mental and behavioral disorders: Secondary | ICD-10-CM | POA: Insufficient documentation

## 2011-09-10 HISTORY — DX: Personal history of other complications of pregnancy, childbirth and the puerperium: Z87.59

## 2011-09-10 HISTORY — DX: Morbid (severe) obesity due to excess calories: E66.01

## 2011-09-10 HISTORY — DX: Personal history of other mental and behavioral disorders: Z86.59

## 2011-09-10 LAB — US OB LIMITED

## 2011-09-10 NOTE — Progress Notes (Signed)
Spotting on Saturday for a couple hours and one day of spotting last weekend. No recent intercourse around the days of spotting. Unable to void. Some vaginal irritation.

## 2011-09-24 ENCOUNTER — Other Ambulatory Visit: Payer: Self-pay

## 2011-09-24 DIAGNOSIS — Z36 Encounter for antenatal screening of mother: Secondary | ICD-10-CM

## 2011-09-25 ENCOUNTER — Other Ambulatory Visit: Payer: Self-pay

## 2011-09-25 ENCOUNTER — Other Ambulatory Visit (HOSPITAL_BASED_OUTPATIENT_CLINIC_OR_DEPARTMENT_OTHER): Payer: BC Managed Care – PPO

## 2011-09-25 ENCOUNTER — Telehealth: Payer: Self-pay | Admitting: *Deleted

## 2011-09-25 ENCOUNTER — Ambulatory Visit (INDEPENDENT_AMBULATORY_CARE_PROVIDER_SITE_OTHER): Payer: BC Managed Care – PPO

## 2011-09-25 ENCOUNTER — Encounter: Payer: Self-pay | Admitting: Oncology

## 2011-09-25 ENCOUNTER — Ambulatory Visit (HOSPITAL_BASED_OUTPATIENT_CLINIC_OR_DEPARTMENT_OTHER): Payer: BC Managed Care – PPO | Admitting: Oncology

## 2011-09-25 VITALS — Temp 98.4°F | Resp 18

## 2011-09-25 DIAGNOSIS — O88219 Thromboembolism in pregnancy, unspecified trimester: Secondary | ICD-10-CM

## 2011-09-25 DIAGNOSIS — I2699 Other pulmonary embolism without acute cor pulmonale: Secondary | ICD-10-CM

## 2011-09-25 DIAGNOSIS — Z36 Encounter for antenatal screening of mother: Secondary | ICD-10-CM

## 2011-09-25 DIAGNOSIS — Z7901 Long term (current) use of anticoagulants: Secondary | ICD-10-CM

## 2011-09-25 DIAGNOSIS — Z3490 Encounter for supervision of normal pregnancy, unspecified, unspecified trimester: Secondary | ICD-10-CM | POA: Insufficient documentation

## 2011-09-25 HISTORY — DX: Encounter for supervision of normal pregnancy, unspecified, unspecified trimester: Z34.90

## 2011-09-25 LAB — CBC WITH DIFFERENTIAL/PLATELET
Basophils Absolute: 0 10*3/uL (ref 0.0–0.1)
HCT: 31.7 % — ABNORMAL LOW (ref 34.8–46.6)
HGB: 10.5 g/dL — ABNORMAL LOW (ref 11.6–15.9)
LYMPH%: 35.3 % (ref 14.0–49.7)
MCH: 28.2 pg (ref 25.1–34.0)
MONO#: 0.2 10*3/uL (ref 0.1–0.9)
NEUT%: 57.6 % (ref 38.4–76.8)
Platelets: 252 10*3/uL (ref 145–400)
WBC: 4.3 10*3/uL (ref 3.9–10.3)
lymph#: 1.5 10*3/uL (ref 0.9–3.3)

## 2011-09-25 LAB — HEPARIN ANTI-XA: Heparin LMW: 1.34 IU/mL

## 2011-09-25 LAB — BASIC METABOLIC PANEL
Calcium: 9.6 mg/dL (ref 8.4–10.5)
Glucose, Bld: 90 mg/dL (ref 70–99)
Potassium: 3.8 mEq/L (ref 3.5–5.3)

## 2011-09-25 NOTE — Progress Notes (Signed)
ID: Heather Vargas  DOB: 06-May-1980  MR#: [id number]  CSN#: [id number]   Interval History:   Patient is seen in followup today at the request of her gynecologist. Patient is a 31 year old female who is now about [redacted] weeks pregnant she recently was seen back on 08/01/2011 in the emergency room for a pulmonary embolism at which time she was also found to be pregnant. She was placed on Lovenox 130 mg twice a day. She has been seen by her OB for an initial visit since finding out that she was pregnant. Currently she is doing well. She is going to keep the pregnancy. And therefore will continue to use lovenox until term. She is having some shortness of breath on exertion but otherwise feels well, no bleeding.  No Known Allergies  Current Outpatient Prescriptions  Medication Sig Dispense Refill  . enoxaparin (LOVENOX) 100 MG/ML injection Inject 1.3 mLs (130 mg total) into the skin 2 (two) times daily.  60 Syringe  3  . labetalol (NORMODYNE) 100 MG tablet Take 1 tablet (100 mg total) by mouth 2 (two) times daily.  60 tablet  0  . labetalol (NORMODYNE) 100 MG tablet Take 1 tablet (100 mg total) by mouth 2 (two) times daily.  30 tablet  2  . Prenatal Vit-Fe Fumarate-FA (PRENATAL MULTIVITAMIN) TABS Take 1 tablet by mouth daily.         Objective:  Filed Vitals:   09/25/11 1253  Temp: 98.4 F (36.9 C)  Resp: 18    BMI: There is no height or weight on file to calculate BMI.   ECOG FS: 1  Physical Exam:   Sclerae unicteric  Oropharynx clear  No peripheral adenopathy  Lungs clear -- no rales or rhonchi  Heart regular rate and rhythm  Abdomen benign  MSK no focal spinal tenderness, no peripheral edema  Neuro nonfocal  Breast exam: Deferred  Lab Results:      Chemistry      Component Value Date/Time   NA 137 08/12/2011 1400   K 4.1 08/12/2011 1400   CL 106 08/12/2011 1400   CO2 22 08/12/2011 1400   BUN 7 08/12/2011 1400   CREATININE 0.79 08/12/2011 1400      Component Value Date/Time   CALCIUM 9.5  08/12/2011 1400   ALKPHOS 44 08/12/2011 1400   AST 20 08/12/2011 1400   ALT 35 08/12/2011 1400   BILITOT 0.4 08/12/2011 1400       Lab Results  Component Value Date   WBC 4.3 09/25/2011   HGB 10.5* 09/25/2011   HCT 31.7* 09/25/2011   MCV 84.9 09/25/2011   PLT 252 09/25/2011   NEUTROABS 2.5 09/25/2011    Studies/Results:  Nm Pulmonary Perfusion  08/02/2011  *RADIOLOGY REPORT*  Clinical Data:  Short of breath.  [redacted] weeks pregnant.  Indeterminate CT pulmonary angiogram.  NUCLEAR MEDICINE VENTILATION - PERFUSION LUNG SCAN  Technique:    Perfusion images were obtained in multiple projections after intravenous injection of Tc-27m MAA.  Radiopharmaceuticals: 2.7 mCi Tc-66m MAA.  Comparison: CT PA 06/26 1013  Findings:  Perfusion: There is several small peripheral perfusion defects in the posterior right lung base as well anterior right upper lobe. Smaller defect within the posterior superior segment of the left upper lobe.  The ventilation portion was avoided in this pregnant patient as a larger dose of xenon would be required in the perfusion then ventilation sequence.   IMPRESSION:  Several small peripheral perfusion defects within the right lung and to a  lesser degree the left lung.  This coupled with the abnormal findings on the comparison CTPA with suspicious tubular filling defects in the right lower lobes is concerning for acute pulmonary embolism.  The findings discussed with Dr. Benjamine Mola on 08/02/2011 at 10:00 a.m.  Original Report Authenticated By: Genevive Bi, M.D.    Assessment: 31 year old female with pulmonary embolism and currently [redacted] weeks pregnant. Patient is currently on anticoagulation with Lovenox 130 mg twice a day. Overall she is tolerating it well. Patient has opted to keep the pregnancy and does not want to pursue any imaging studies to evaluate the response to anticoagulation therapy due to risks to the fetus.  Plan:  1. Patient will continue lovenox q 12 hours for the duration of the  pregnancy. When she is approaching term we will need to convert her to unfractionated heparin. She understands the rational for this.  The length of time of the face-to-face encounter was  30    minutes. More than 50% of time was spent counseling and coordination of care.       Drue Second 09/25/2011

## 2011-09-25 NOTE — Telephone Encounter (Signed)
11-25-2011 gave patient appointment for 11-25-2011 starting at 8:30am with labs

## 2011-09-25 NOTE — Patient Instructions (Addendum)
Continue lovenox twice a day  I will see you back in 2 months

## 2011-09-27 ENCOUNTER — Telehealth: Payer: Self-pay | Admitting: *Deleted

## 2011-09-27 NOTE — Telephone Encounter (Signed)
Per MD, lmovm for pt to continue same dose of heparin.  Requested pt o call back to verify message was received

## 2011-09-27 NOTE — Telephone Encounter (Signed)
Pt notified to continue same dose. Heparin 130mg  2 times daily. Pt advised she has 1 more refill. Pt to call when ready for medication to be refilled. Pt verbalized understanding.

## 2011-09-27 NOTE — Telephone Encounter (Signed)
Message copied by Cooper Render on Fri Sep 27, 2011 10:25 AM ------      Message from: Victorino December      Created: Thu Sep 26, 2011  9:11 AM       Call patient:keep same dose of heparin

## 2011-10-08 ENCOUNTER — Other Ambulatory Visit: Payer: BC Managed Care – PPO

## 2011-10-08 ENCOUNTER — Ambulatory Visit (INDEPENDENT_AMBULATORY_CARE_PROVIDER_SITE_OTHER): Payer: BC Managed Care – PPO | Admitting: Obstetrics and Gynecology

## 2011-10-08 VITALS — BP 104/56 | Wt 275.0 lb

## 2011-10-08 DIAGNOSIS — I2699 Other pulmonary embolism without acute cor pulmonale: Secondary | ICD-10-CM

## 2011-10-08 DIAGNOSIS — I10 Essential (primary) hypertension: Secondary | ICD-10-CM

## 2011-10-08 DIAGNOSIS — Z331 Pregnant state, incidental: Secondary | ICD-10-CM

## 2011-10-08 NOTE — Progress Notes (Signed)
No complaints today.  Doing well.

## 2011-10-08 NOTE — Progress Notes (Signed)
[redacted]w[redacted]d. CHTN on Labetalol 100 mg BID. No Sx of PIH              Pulmonary embolism at 6 weeks now on Lovenox 1st trimester screen: normal AFP at next visit and early Glucola

## 2011-10-16 ENCOUNTER — Other Ambulatory Visit: Payer: Self-pay | Admitting: Obstetrics and Gynecology

## 2011-11-05 ENCOUNTER — Ambulatory Visit (INDEPENDENT_AMBULATORY_CARE_PROVIDER_SITE_OTHER): Payer: Medicaid Other

## 2011-11-05 ENCOUNTER — Ambulatory Visit (INDEPENDENT_AMBULATORY_CARE_PROVIDER_SITE_OTHER): Payer: Medicaid Other | Admitting: Obstetrics and Gynecology

## 2011-11-05 ENCOUNTER — Encounter: Payer: Self-pay | Admitting: Obstetrics and Gynecology

## 2011-11-05 VITALS — BP 120/80 | Wt 272.0 lb

## 2011-11-05 DIAGNOSIS — Z331 Pregnant state, incidental: Secondary | ICD-10-CM

## 2011-11-05 DIAGNOSIS — Z13820 Encounter for screening for osteoporosis: Secondary | ICD-10-CM

## 2011-11-05 NOTE — Progress Notes (Signed)
Patient ID: Heather Vargas, female   DOB: [birth year], 31 y.o.   MRN: [id number] Early 1 gtt [redacted]w[redacted]d US anatomy wnl. Declines genetic testing Plans BTL discussed. Has f/o with Dr. Welton Flakes. Lavera Guise, CNM

## 2011-11-05 NOTE — Progress Notes (Signed)
Pt stopped taking PNV because they make her feel nauseous and would like to know if there is something different she can take. Early glucola given today.

## 2011-11-13 ENCOUNTER — Telehealth: Payer: Self-pay | Admitting: Obstetrics and Gynecology

## 2011-11-13 NOTE — Telephone Encounter (Signed)
Spoke with pt rgd msg pt wants lab results informed 1 hr gtt wnl pt voice understanding

## 2011-11-18 ENCOUNTER — Telehealth: Payer: Self-pay | Admitting: Obstetrics and Gynecology

## 2011-11-18 ENCOUNTER — Ambulatory Visit (INDEPENDENT_AMBULATORY_CARE_PROVIDER_SITE_OTHER): Payer: Medicaid Other | Admitting: Obstetrics and Gynecology

## 2011-11-18 VITALS — BP 100/62 | Wt 273.0 lb

## 2011-11-18 DIAGNOSIS — Z3480 Encounter for supervision of other normal pregnancy, unspecified trimester: Secondary | ICD-10-CM

## 2011-11-18 LAB — CBC
HCT: 29.7 % — ABNORMAL LOW (ref 36.0–46.0)
Hemoglobin: 9.9 g/dL — ABNORMAL LOW (ref 12.0–15.0)
MCV: 80.7 fL (ref 78.0–100.0)
RBC: 3.68 MIL/uL — ABNORMAL LOW (ref 3.87–5.11)
WBC: 5.5 10*3/uL (ref 4.0–10.5)

## 2011-11-18 NOTE — Progress Notes (Signed)
[redacted]w[redacted]d Here for visit c/o pain over upper left chest that is random and not assoc with difficulty breathing and doesn't feel like her previous PE and is taking her medicine.  The discomfort comes randomly and last short period of time Lungs are clear Heart RRR and she is not tachycardic and in no acute distress Pt will keep next appt and notify us if sxs worsens Labs today Offered visit to hosp for O2sat and xray given history.  Pt declines.  Rec to take it easier and call with worsening sxs

## 2011-11-18 NOTE — Telephone Encounter (Signed)
TC from pt. States for the past few days has been having sharp shooting pain in chest between breasts that comes and goes a few times/day. Has shortness of breath which has had since early pregnancy when was diagnosed with PE.  Is on Lovenox. After consult with VL, sched for eval with DR AR today. Pt agreeable.

## 2011-11-19 LAB — COMPREHENSIVE METABOLIC PANEL
ALT: <8 U/L (ref 0–35)
AST: 10 U/L (ref 0–37)
BUN: 4 mg/dL — ABNORMAL LOW (ref 6–23)
CO2: 25 mEq/L (ref 19–32)
Chloride: 106 mEq/L (ref 96–112)
Glucose, Bld: 77 mg/dL (ref 70–99)
Potassium: 3.7 mEq/L (ref 3.5–5.3)
Total Protein: 6.5 g/dL (ref 6.0–8.3)

## 2011-11-25 ENCOUNTER — Ambulatory Visit (HOSPITAL_BASED_OUTPATIENT_CLINIC_OR_DEPARTMENT_OTHER): Payer: BC Managed Care – PPO | Admitting: Oncology

## 2011-11-25 ENCOUNTER — Encounter: Payer: Self-pay | Admitting: Oncology

## 2011-11-25 ENCOUNTER — Other Ambulatory Visit (HOSPITAL_BASED_OUTPATIENT_CLINIC_OR_DEPARTMENT_OTHER): Payer: Medicaid Other | Admitting: Lab

## 2011-11-25 ENCOUNTER — Telehealth: Payer: Self-pay

## 2011-11-25 ENCOUNTER — Telehealth: Payer: Self-pay | Admitting: *Deleted

## 2011-11-25 VITALS — BP 122/88 | HR 76 | Temp 98.5°F | Resp 20 | Ht 62.0 in | Wt 277.4 lb

## 2011-11-25 DIAGNOSIS — Z86711 Personal history of pulmonary embolism: Secondary | ICD-10-CM

## 2011-11-25 DIAGNOSIS — I2699 Other pulmonary embolism without acute cor pulmonale: Secondary | ICD-10-CM

## 2011-11-25 DIAGNOSIS — O99019 Anemia complicating pregnancy, unspecified trimester: Secondary | ICD-10-CM

## 2011-11-25 DIAGNOSIS — D649 Anemia, unspecified: Secondary | ICD-10-CM

## 2011-11-25 DIAGNOSIS — D6859 Other primary thrombophilia: Secondary | ICD-10-CM

## 2011-11-25 DIAGNOSIS — Z3490 Encounter for supervision of normal pregnancy, unspecified, unspecified trimester: Secondary | ICD-10-CM

## 2011-11-25 LAB — CBC WITH DIFFERENTIAL/PLATELET
Basophils Absolute: 0 10*3/uL (ref 0.0–0.1)
EOS%: 1 % (ref 0.0–7.0)
Eosinophils Absolute: 0.1 10*3/uL (ref 0.0–0.5)
HGB: 9.8 g/dL — ABNORMAL LOW (ref 11.6–15.9)
LYMPH%: 33.5 % (ref 14.0–49.7)
MCH: 28.2 pg (ref 25.1–34.0)
MCV: 85.5 fL (ref 79.5–101.0)
MONO%: 6.4 % (ref 0.0–14.0)
NEUT#: 2.9 10*3/uL (ref 1.5–6.5)
NEUT%: 58.6 % (ref 38.4–76.8)
Platelets: 228 10*3/uL (ref 145–400)

## 2011-11-25 LAB — COMPREHENSIVE METABOLIC PANEL (CC13)
Albumin: 2.9 g/dL — ABNORMAL LOW (ref 3.5–5.0)
Alkaline Phosphatase: 64 U/L (ref 40–150)
BUN: 4 mg/dL — ABNORMAL LOW (ref 7.0–26.0)
Creatinine: 0.7 mg/dL (ref 0.6–1.1)
Glucose: 84 mg/dl (ref 70–99)
Total Bilirubin: 0.3 mg/dL (ref 0.20–1.20)

## 2011-11-25 MED ORDER — ENOXAPARIN SODIUM 100 MG/ML ~~LOC~~ SOLN: 130.0000 mg | Freq: Two times a day (BID) | SUBCUTANEOUS | Status: DC

## 2011-11-25 NOTE — Patient Instructions (Addendum)
Continue lovenox twice a day  I will see you back in January 2014 at which time we will convert to unfractionated heparin

## 2011-11-25 NOTE — Telephone Encounter (Signed)
Spoke to pt to let her know that Dr. Su Hilt recommended that she try Floridex bid otc, as that should be more easily tolerated than regular otc iron supps. Pt will continue on the Flintstones chewable vitamins, as she cannot tolerate her PNV's. She can also try Vargas stool softener, as she may get constipated on the bid Floridex. Pt understands. Heather Vargas

## 2011-11-25 NOTE — Progress Notes (Signed)
ID: Heather Vargas  DOB: [birth year]  MR#: [id number]  CSN#: [id number]   Interval History:   Patient is seen in followup today overall she is doing well she is now [redacted] weeks gestation. She is continuing to do well except for some fatigue. She is noted to be anemic with a hemoglobin of 9.8. She is denying having any significant chest pains or palpitations. She is short of breath especially on ambulation but it is slowly improving but I do think that the anemia may be contributing as well. She denies any nausea or vomiting no fevers chills or night sweats. No myalgias and arthralgias. Patient is not taking any prenatal vitamins as she cannot tolerate them and develops nausea and vomiting. We did discuss her taking at least oral iron since she is becoming anemic with her pregnancy. This with her OB physician.   No Known Allergies  Current Outpatient Prescriptions  Medication Sig Dispense Refill  . enoxaparin (LOVENOX) 100 MG/ML injection Inject 1.3 mLs (130 mg total) into the skin 2 (two) times daily.  60 Syringe  6  . labetalol (NORMODYNE) 100 MG tablet TAKE 1 TABLET BY MOUTH TWICE DAILY  60 tablet  2  . DISCONTD: enoxaparin (LOVENOX) 100 MG/ML injection Inject 1.3 mLs (130 mg total) into the skin 2 (two) times daily.  60 Syringe  3  . DISCONTD: labetalol (NORMODYNE) 100 MG tablet Take 1 tablet (100 mg total) by mouth 2 (two) times daily.  60 tablet  0  . DISCONTD: labetalol (NORMODYNE) 100 MG tablet Take 1 tablet (100 mg total) by mouth 2 (two) times daily.  30 tablet  2  . Prenatal Vit-Fe Fumarate-FA (PRENATAL MULTIVITAMIN) TABS Take 1 tablet by mouth daily.         Objective:  Filed Vitals:   11/25/11 0924  BP: 122/88  Pulse: 76  Temp: 98.5 F (36.9 C)  Resp: 20    BMI: Body mass index is 50.74 kg/(m^2).   ECOG FS: 1  Physical Exam:   Sclerae unicteric  Oropharynx clear  No peripheral adenopathy  Lungs clear -- no rales or rhonchi  Heart regular rate and rhythm  Abdomen benign  MSK  no focal spinal tenderness, no peripheral edema  Neuro nonfocal  Breast exam: Deferred  Lab Results:      Chemistry      Component Value Date/Time   NA 137 11/18/2011 1451   K 3.7 11/18/2011 1451   CL 106 11/18/2011 1451   CO2 25 11/18/2011 1451   BUN 4* 11/18/2011 1451   CREATININE 0.66 11/18/2011 1451   CREATININE 0.73 09/25/2011 1234      Component Value Date/Time   CALCIUM 9.4 11/18/2011 1451   ALKPHOS 56 11/18/2011 1451   AST 10 11/18/2011 1451   ALT <8 11/18/2011 1451   BILITOT 0.4 11/18/2011 1451       Lab Results  Component Value Date   WBC 5.0 11/25/2011   HGB 9.8* 11/25/2011   HCT 29.8* 11/25/2011   MCV 85.5 11/25/2011   PLT 228 11/25/2011   NEUTROABS 2.9 11/25/2011    Studies/Results:   Assessment: 31 year old female with   #1History of pulmonary embolism and currently [redacted] weeks pregnant. Patient is currently on anticoagulation with Lovenox 130 mg twice a day. Overall she is tolerating it well. Her low molecular weight heparin level was therapeutic today at 0.86.  #2 anemia likely due to iron deficiency my recommendation is for patient to begin taking oral iron on a daily basis.  Plan:  1. Patient will continue lovenox q 12 hours for the duration of the pregnancy. When she is approaching term we will need to convert her to unfractionated heparin. She understands the rational for this.  #2 I will plan on seeing the patient back in January 2014 that will be about 3 weeks prior to her delivery. Plan at that time would be to convert her to unfractionated heparin.  The length of time of the face-to-face encounter was  30    minutes. More than 50% of time was spent counseling and coordination of care.       Welton Flakes, Niaya Hickok 11/25/2011

## 2011-11-25 NOTE — Telephone Encounter (Signed)
Spoke to pt  To notify her of lab results. Per AR she needs iron supplement bid w/ colace. Pt states she cannot take otc iron. It makes her nauseated. She is frustrated, because she states that she has mentioned on several occasions at her appt's that she cannot take her PNV's, either  Due to them making her ill. She has requested that she be prescribed chewable pnv's and also RX iron. I told her I could also send her an iron rich food diet, and she states she is on blood thinners and she is limited to which foods she can eat. I said I would check with AR when she comes in this afternoon and get back with her. Melody Comas A

## 2011-11-25 NOTE — Telephone Encounter (Signed)
Gave patient appointment for 02-17-2012 starting at 8:30am

## 2011-12-03 ENCOUNTER — Ambulatory Visit (INDEPENDENT_AMBULATORY_CARE_PROVIDER_SITE_OTHER): Payer: Medicaid Other | Admitting: Obstetrics and Gynecology

## 2011-12-03 VITALS — BP 100/62 | Wt 277.0 lb

## 2011-12-03 DIAGNOSIS — Z9851 Tubal ligation status: Secondary | ICD-10-CM | POA: Insufficient documentation

## 2011-12-03 NOTE — Progress Notes (Signed)
Pt stated no issues today. Pt stated she needs a rx for liquid iron Floridex. Per Dr. Su Hilt

## 2011-12-03 NOTE — Progress Notes (Signed)
Early glucola 84--plan repeat at [place]. On Lovenox daily--saw hematologist (Dr. Gustavo Lah conversion to heparin at 36 weeks.  Next visit with her in 02/2012. Per note from MFM, they recommend induction at 39 weeks. Patient wants BTL--will sign consent at [place] (has Medicaid secondary) Wants circumcision--will talk with check-out about coverage. Recommend Floridix for fe supplementation--wrote Rx to see if she can get insurance reimbursement.

## 2012-01-01 ENCOUNTER — Encounter: Payer: Self-pay | Admitting: Obstetrics and Gynecology

## 2012-01-01 ENCOUNTER — Ambulatory Visit (INDEPENDENT_AMBULATORY_CARE_PROVIDER_SITE_OTHER): Payer: Medicaid Other | Admitting: Obstetrics and Gynecology

## 2012-01-01 VITALS — BP 112/66 | Wt 275.0 lb

## 2012-01-01 DIAGNOSIS — Z3480 Encounter for supervision of other normal pregnancy, unspecified trimester: Secondary | ICD-10-CM

## 2012-01-01 LAB — CBC
HCT: 29.2 % — ABNORMAL LOW (ref 36.0–46.0)
Hemoglobin: 9.5 g/dL — ABNORMAL LOW (ref 12.0–15.0)
MCH: 26.7 pg (ref 26.0–34.0)
MCHC: 32.5 g/dL (ref 30.0–36.0)
MCV: 82 fL (ref 78.0–100.0)
Platelets: 253 K/uL (ref 150–400)
RBC: 3.56 MIL/uL — ABNORMAL LOW (ref 3.87–5.11)
RDW: 14.7 % (ref 11.5–15.5)
WBC: 5.6 K/uL (ref 4.0–10.5)

## 2012-01-01 NOTE — Progress Notes (Signed)
[redacted]w[redacted]d. GTT today. Declines flu vaccine. C/O slight pressure. No pain.
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
## 2012-01-02 ENCOUNTER — Encounter: Payer: Self-pay | Admitting: Obstetrics and Gynecology

## 2012-01-02 DIAGNOSIS — D649 Anemia, unspecified: Secondary | ICD-10-CM | POA: Insufficient documentation

## 2012-01-02 LAB — GLUCOSE TOLERANCE, 1 HOUR (50G) W/O FASTING: Glucose, 1 Hour GTT: 114 mg/dL (ref 70–140)

## 2012-01-09 ENCOUNTER — Encounter: Payer: Self-pay | Admitting: Obstetrics and Gynecology

## 2012-01-09 ENCOUNTER — Encounter (HOSPITAL_COMMUNITY): Payer: Self-pay | Admitting: *Deleted

## 2012-01-09 ENCOUNTER — Telehealth: Payer: Self-pay | Admitting: Obstetrics and Gynecology

## 2012-01-09 ENCOUNTER — Inpatient Hospital Stay (HOSPITAL_COMMUNITY)
Admission: AD | Admit: 2012-01-09 | Discharge: 2012-01-09 | Disposition: A | Discharge status: Home / Self Care | Payer: BC Managed Care – PPO | Source: Ambulatory Visit | Attending: Obstetrics and Gynecology | Admitting: Obstetrics and Gynecology

## 2012-01-09 DIAGNOSIS — F410 Panic disorder [episodic paroxysmal anxiety] without agoraphobia: Secondary | ICD-10-CM | POA: Insufficient documentation

## 2012-01-09 DIAGNOSIS — Z9851 Tubal ligation status: Secondary | ICD-10-CM

## 2012-01-09 DIAGNOSIS — D649 Anemia, unspecified: Secondary | ICD-10-CM

## 2012-01-09 DIAGNOSIS — R030 Elevated blood-pressure reading, without diagnosis of hypertension: Secondary | ICD-10-CM | POA: Insufficient documentation

## 2012-01-09 DIAGNOSIS — R51 Headache: Secondary | ICD-10-CM | POA: Insufficient documentation

## 2012-01-09 DIAGNOSIS — I2699 Other pulmonary embolism without acute cor pulmonale: Secondary | ICD-10-CM

## 2012-01-09 DIAGNOSIS — O99340 Other mental disorders complicating pregnancy, unspecified trimester: Secondary | ICD-10-CM

## 2012-01-09 LAB — URINE MICROSCOPIC-ADD ON

## 2012-01-09 LAB — URINALYSIS, ROUTINE W REFLEX MICROSCOPIC
Bilirubin Urine: NEGATIVE
Glucose, UA: NEGATIVE mg/dL
Hgb urine dipstick: NEGATIVE
Ketones, ur: NEGATIVE mg/dL
Specific Gravity, Urine: <1.005 — ABNORMAL LOW (ref 1.005–1.030)
pH: 7 (ref 5.0–8.0)

## 2012-01-09 NOTE — MAU Note (Signed)
Was overwhelmed at work, had a panic attack.  Went to see nurse- BP was up 140/100, 2nd time 138/94, pulse was 100.  Started to get headache.  Called dr, told to come in.

## 2012-01-09 NOTE — Telephone Encounter (Signed)
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  Pt is currently taking Labatolol 100 mg BID, has not had issues w/ BP before panic attack today.  Pt has hx of anxiety.  Also c/o HA, no meds taken for relief.  Has had about 32 oz of water so far and does report + fm.  Had pt have nurse there recheck her BP while consulting with a provider.  Pt states BP was 130's/90's.  Per MK, have pt come to MAU for BP check for accuracy.  Pt voices agreement.

## 2012-01-20 ENCOUNTER — Encounter: Payer: Self-pay | Admitting: Obstetrics and Gynecology

## 2012-01-20 ENCOUNTER — Ambulatory Visit (INDEPENDENT_AMBULATORY_CARE_PROVIDER_SITE_OTHER): Payer: Medicaid Other

## 2012-01-20 ENCOUNTER — Ambulatory Visit (INDEPENDENT_AMBULATORY_CARE_PROVIDER_SITE_OTHER): Payer: Medicaid Other | Admitting: Obstetrics and Gynecology

## 2012-01-20 VITALS — BP 118/70 | Wt 279.0 lb

## 2012-01-20 DIAGNOSIS — I10 Essential (primary) hypertension: Secondary | ICD-10-CM

## 2012-01-20 DIAGNOSIS — Z3480 Encounter for supervision of other normal pregnancy, unspecified trimester: Secondary | ICD-10-CM

## 2012-01-20 DIAGNOSIS — O169 Unspecified maternal hypertension, unspecified trimester: Secondary | ICD-10-CM

## 2012-01-20 NOTE — Progress Notes (Signed)
[redacted]w[redacted]d Ultrasound for hypertension: Single gestation, vertex, normal fluid, 3 lbs. 3 oz. (49th percentile), cervix 2.12 cm. Hemoglobin 9.5, RPR nonreactive, Glucola 114. Return office in 2 weeks. Biophysical profile next visit. Continue Lovenox. Dr. Stefano Gaul

## 2012-01-20 NOTE — Progress Notes (Signed)
[redacted]w[redacted]d U/S Comments: EFW = 3lb3oz 49th% Vertex presentation Posterior placenta Normal fluid, AFI of 13.2 cm = 40th% Normal linear growth CX closed normal ovaries/adnexa

## 2012-01-24 ENCOUNTER — Telehealth: Payer: Self-pay | Admitting: Obstetrics and Gynecology

## 2012-01-24 ENCOUNTER — Encounter: Payer: Self-pay | Admitting: Obstetrics and Gynecology

## 2012-01-24 LAB — US OB FOLLOW UP

## 2012-01-24 NOTE — Telephone Encounter (Signed)
Tc to pt per telephone call. Pt with external itching only. Informed pt to try otc Hydrocortisone cream as directed. May also try Benadryl if desires. Pt to cb if sx's worsen or no improvement. Pt voices understanding.

## 2012-01-27 ENCOUNTER — Telehealth: Payer: Self-pay | Admitting: *Deleted

## 2012-01-27 NOTE — Telephone Encounter (Signed)
Per staff message placed on NP schedule

## 2012-01-28 ENCOUNTER — Inpatient Hospital Stay (HOSPITAL_COMMUNITY)
Admission: AD | Admit: 2012-01-28 | Discharge: 2012-01-28 | Disposition: A | Discharge status: Home / Self Care | Payer: BC Managed Care – PPO | Source: Ambulatory Visit | Attending: Obstetrics and Gynecology | Admitting: Obstetrics and Gynecology

## 2012-01-28 ENCOUNTER — Telehealth: Payer: Self-pay | Admitting: Obstetrics and Gynecology

## 2012-01-28 ENCOUNTER — Encounter (HOSPITAL_COMMUNITY): Payer: Self-pay

## 2012-01-28 DIAGNOSIS — Z9851 Tubal ligation status: Secondary | ICD-10-CM

## 2012-01-28 DIAGNOSIS — R109 Unspecified abdominal pain: Secondary | ICD-10-CM | POA: Insufficient documentation

## 2012-01-28 DIAGNOSIS — D649 Anemia, unspecified: Secondary | ICD-10-CM

## 2012-01-28 DIAGNOSIS — B373 Candidiasis of vulva and vagina: Secondary | ICD-10-CM

## 2012-01-28 DIAGNOSIS — O468X9 Other antepartum hemorrhage, unspecified trimester: Secondary | ICD-10-CM

## 2012-01-28 DIAGNOSIS — B3731 Acute candidiasis of vulva and vagina: Secondary | ICD-10-CM | POA: Insufficient documentation

## 2012-01-28 DIAGNOSIS — I2699 Other pulmonary embolism without acute cor pulmonale: Secondary | ICD-10-CM

## 2012-01-28 DIAGNOSIS — O2390 Unspecified genitourinary tract infection in pregnancy, unspecified trimester: Secondary | ICD-10-CM | POA: Insufficient documentation

## 2012-01-28 DIAGNOSIS — N949 Unspecified condition associated with female genital organs and menstrual cycle: Secondary | ICD-10-CM | POA: Insufficient documentation

## 2012-01-28 LAB — URINE MICROSCOPIC-ADD ON

## 2012-01-28 LAB — WET PREP, GENITAL: Clue Cells Wet Prep HPF POC: NONE SEEN

## 2012-01-28 LAB — URINALYSIS, ROUTINE W REFLEX MICROSCOPIC
Bilirubin Urine: NEGATIVE
Hgb urine dipstick: NEGATIVE
Specific Gravity, Urine: <1.005 — ABNORMAL LOW (ref 1.005–1.030)
pH: 6 (ref 5.0–8.0)

## 2012-01-28 MED ORDER — TERCONAZOLE 0.4 % VA CREA: 1.0000 | TOPICAL_CREAM | Freq: Every day | VAGINAL | Status: DC

## 2012-01-28 NOTE — Telephone Encounter (Signed)
TC from pt. States had bright red bleeding with mucus this AM which has stopped. Having abd pressure.  +FM  No recent IC. After consult with MK, advised pt to go to MAU. Pt at first reluctant but advised needs to have cervix check and be placed on monitor to R/O cont. Pt verbalizes comprehension and is agreeable.

## 2012-01-28 NOTE — MAU Note (Signed)
Patient states she has started to have lower abdominal pressure with a bloody vaginal discharge. Denies leaking fluid and reports good fetal movement.

## 2012-01-28 NOTE — Progress Notes (Signed)
states she has started to have lower abdominal pressure with a bloody vaginal discharge. Denies leaking fluid and reports good fetal movement.  No UC, no N,V,D. O BP 137/87  Pulse 82  Temp 97.8 F (36.6 C) (Oral)  Resp 18  Ht 5\' 3"  (1.6 m)  Wt 280 lb (127.007 kg)  BMI 49.60 kg/m2  SpO2 100%  LMP 06/24/2011      fhts category 1      abd soft nontender      Contractions without        Complaint of .Marland Kitchen Abd soft, nt Normal hair distrubition mons pubis,  EGBUS WNL, sterile speculum exam,  vagina pink, moist normal rugae,  cerix LTC, Wet prep +hyphae neg clue neg trich A [redacted]w[redacted]d     VVC P terazol 7 Lavera Guise, [place]

## 2012-01-29 LAB — URINE CULTURE

## 2012-02-04 ENCOUNTER — Ambulatory Visit (INDEPENDENT_AMBULATORY_CARE_PROVIDER_SITE_OTHER): Payer: Medicaid Other

## 2012-02-04 ENCOUNTER — Ambulatory Visit (INDEPENDENT_AMBULATORY_CARE_PROVIDER_SITE_OTHER): Payer: Medicaid Other | Admitting: Obstetrics and Gynecology

## 2012-02-04 ENCOUNTER — Encounter: Payer: Self-pay | Admitting: Obstetrics and Gynecology

## 2012-02-04 VITALS — BP 104/68 | Wt 278.0 lb

## 2012-02-04 DIAGNOSIS — O169 Unspecified maternal hypertension, unspecified trimester: Secondary | ICD-10-CM

## 2012-02-04 DIAGNOSIS — I10 Essential (primary) hypertension: Secondary | ICD-10-CM

## 2012-02-04 NOTE — Progress Notes (Signed)
[redacted]w[redacted]d BPP =8/8.  AFI nl Pt has some nagging back pain  BPP in one week, Growth u/s in 2 wks Has heme appt to change Lovenox to heparin in 4 wks.

## 2012-02-05 NOTE — L&D Delivery Note (Signed)
Delivery Note Cx complete at 1050.  Pushed great w/ 2 ctxs to SVD at 11:05 AM. A viable female "Heather Vargas" was delivered via Vaginal, Spontaneous Delivery (Presentation: Right Occiput Anterior). Loose NCx1; unable to reduce over head, but able to slide over shoulders and body during delivery.  Newborn w/ spontaneous, lusty cry as placed on mom's abdomen.  Cord doubly clamped and cut by pt's friend. APGAR: 8, 9; weight: TBA .   Placenta status: Intact, Spontaneous, Schultz.  Cord: 3 vessels with the following complications: subjectively Short.  Cord pH: not collected.  Anesthesia: Epidural  Episiotomy: None Lacerations: None Suture Repair: n/a Est. Blood Loss (mL): 300 .Marland Kitchen Filed Vitals:   03/24/12 0952 03/24/12 1000 03/24/12 1032 03/24/12 1132  BP: 111/68 99/70 118/82 116/64  Pulse: 105 106 95 110  Temp:    98 F (36.7 C)  TempSrc:    Oral  Resp:  18 20 18   Height:      Weight:       Mom to postpartum.  Baby to nursery-stable. Pt to be NPO after MN for BTL tomorrow.  Desires inpatient circ.   In&Out cath after delivery and sent for u/a and d/c'd 24 hr urine. Labetalol to continue at 100mg  po bid on PP and will check daily fasting cbg's for questionable h/o DM and/or insulin resistance.  Heather Vargas H 03/24/2012, 12:06 PM

## 2012-02-10 ENCOUNTER — Other Ambulatory Visit: Payer: Self-pay

## 2012-02-10 DIAGNOSIS — O10019 Pre-existing essential hypertension complicating pregnancy, unspecified trimester: Secondary | ICD-10-CM

## 2012-02-11 ENCOUNTER — Ambulatory Visit (INDEPENDENT_AMBULATORY_CARE_PROVIDER_SITE_OTHER): Payer: Medicaid Other | Admitting: Obstetrics and Gynecology

## 2012-02-11 ENCOUNTER — Ambulatory Visit (INDEPENDENT_AMBULATORY_CARE_PROVIDER_SITE_OTHER): Payer: BC Managed Care – PPO

## 2012-02-11 VITALS — BP 120/80 | Wt 280.0 lb

## 2012-02-11 DIAGNOSIS — O10019 Pre-existing essential hypertension complicating pregnancy, unspecified trimester: Secondary | ICD-10-CM

## 2012-02-11 DIAGNOSIS — I10 Essential (primary) hypertension: Secondary | ICD-10-CM

## 2012-02-11 NOTE — Progress Notes (Signed)
[redacted]w[redacted]d

## 2012-02-11 NOTE — Progress Notes (Signed)
[redacted]w[redacted]d Ultrasound: Single gestation, vertex, normal fluid, cervix 4.01 cm, BPP 8 out of 8. Return to office in 1 week. Biophysical profile next week. Continue Lovenox. Dr. Stefano Gaul

## 2012-02-11 NOTE — Addendum Note (Signed)
Addended by: Tim Lair on: 02/11/2012 05:33 PM   Modules accepted: Orders

## 2012-02-17 ENCOUNTER — Telehealth: Payer: Self-pay | Admitting: *Deleted

## 2012-02-17 ENCOUNTER — Encounter: Payer: Self-pay | Admitting: Adult Health

## 2012-02-17 ENCOUNTER — Telehealth: Payer: Self-pay | Admitting: Obstetrics and Gynecology

## 2012-02-17 ENCOUNTER — Other Ambulatory Visit (HOSPITAL_BASED_OUTPATIENT_CLINIC_OR_DEPARTMENT_OTHER): Payer: BC Managed Care – PPO | Admitting: Lab

## 2012-02-17 ENCOUNTER — Ambulatory Visit: Payer: BC Managed Care – PPO | Admitting: Oncology

## 2012-02-17 ENCOUNTER — Ambulatory Visit: Payer: BC Managed Care – PPO

## 2012-02-17 ENCOUNTER — Ambulatory Visit (HOSPITAL_BASED_OUTPATIENT_CLINIC_OR_DEPARTMENT_OTHER): Payer: BC Managed Care – PPO | Admitting: Adult Health

## 2012-02-17 VITALS — BP 138/84 | HR 88 | Temp 98.0°F | Resp 20 | Ht 63.0 in | Wt 279.4 lb

## 2012-02-17 DIAGNOSIS — Z86711 Personal history of pulmonary embolism: Secondary | ICD-10-CM

## 2012-02-17 DIAGNOSIS — D509 Iron deficiency anemia, unspecified: Secondary | ICD-10-CM

## 2012-02-17 DIAGNOSIS — D649 Anemia, unspecified: Secondary | ICD-10-CM

## 2012-02-17 DIAGNOSIS — I2699 Other pulmonary embolism without acute cor pulmonale: Secondary | ICD-10-CM

## 2012-02-17 DIAGNOSIS — Z331 Pregnant state, incidental: Secondary | ICD-10-CM

## 2012-02-17 DIAGNOSIS — D6859 Other primary thrombophilia: Secondary | ICD-10-CM

## 2012-02-17 LAB — CBC WITH DIFFERENTIAL/PLATELET
Basophils Absolute: 0 10*3/uL (ref 0.0–0.1)
EOS%: 1.8 % (ref 0.0–7.0)
Eosinophils Absolute: 0.1 10*3/uL (ref 0.0–0.5)
HGB: 10.3 g/dL — ABNORMAL LOW (ref 11.6–15.9)
NEUT#: 2.7 10*3/uL (ref 1.5–6.5)
RDW: 16.7 % — ABNORMAL HIGH (ref 11.2–14.5)
lymph#: 1.6 10*3/uL (ref 0.9–3.3)

## 2012-02-17 LAB — COMPREHENSIVE METABOLIC PANEL (CC13)
AST: 10 U/L (ref 5–34)
Albumin: 2.7 g/dL — ABNORMAL LOW (ref 3.5–5.0)
BUN: 6 mg/dL — ABNORMAL LOW (ref 7.0–26.0)
Calcium: 9.3 mg/dL (ref 8.4–10.4)
Chloride: 109 mEq/L — ABNORMAL HIGH (ref 98–107)
Glucose: 89 mg/dl (ref 70–99)
Potassium: 3.5 mEq/L (ref 3.5–5.1)
Sodium: 138 mEq/L (ref 136–145)
Total Protein: 7.3 g/dL (ref 6.4–8.3)

## 2012-02-17 LAB — IRON AND TIBC: TIBC: 505 ug/dL — ABNORMAL HIGH (ref 250–470)

## 2012-02-17 MED ORDER — LABETALOL HCL 100 MG PO TABS: 100.0000 mg | ORAL_TABLET | Freq: Two times a day (BID) | ORAL | Status: DC

## 2012-02-17 NOTE — Telephone Encounter (Signed)
Saw AVS on 02/11/12

## 2012-02-17 NOTE — Telephone Encounter (Signed)
118 pm Called pt on Mobile # per pt request advised her Rx for Unfractionated Heprin with Parabon at [facility] ready for pick up by 2pm today, pt is to bring her insurance card with her. Instructed pt, Per NP pt to give injection BID. Starting today, since pt has missed her am shot pt to give only PM injection. Beginning tomorrow 1 injection in AM and 1 PM.  Pt will f/u on 2 weeks, to expect a call regarding her next appt date/time from scheduling. Pt verbalized understanding, no further questions.

## 2012-02-17 NOTE — Progress Notes (Signed)
ID: Heather Vargas  DOB: Jun 15, 1980  MR#: [id number]  CSN#: [id number]   Interval History:   Patient is seen in followup today overall she is doing well she is now [redacted] weeks gestation.  Her anemia has improved slightly.  She denies chest pain/palpitations, and remains short of breath on occasion.  She has had some hypertension with her pregnancy.  Otherwise, she's doing well.    No Known Allergies  Current Outpatient Prescriptions  Medication Sig Dispense Refill  . acetaminophen (TYLENOL) 500 MG tablet Take 1,000 mg by mouth every 6 (six) hours as needed. For headache      . enoxaparin (LOVENOX) 100 MG/ML injection Inject 1.3 mLs (130 mg total) into the skin 2 (two) times daily.  60 Syringe  6  . labetalol (NORMODYNE) 100 MG tablet TAKE 1 TABLET BY MOUTH TWICE DAILY  60 tablet  2  . Prenatal Vit-Fe Fumarate-FA (PRENATAL MULTIVITAMIN) TABS Take 1 tablet by mouth daily.      Marland Kitchen terconazole (TERAZOL 7) 0.4 % vaginal cream Place 1 applicator vaginally at bedtime. x7 no intercourse x 7 days  45 g  0     Objective:  Filed Vitals:   02/17/12 0917  BP: 138/84  Pulse: 88  Temp: 98 F (36.7 C)  Resp: 20    BMI: Body mass index is 49.49 kg/(m^2).   ECOG FS: 1  Physical Exam:   Sclerae unicteric  Oropharynx clear  No peripheral adenopathy  Lungs clear -- no rales or rhonchi  Heart regular rate and rhythm  Abdomen benign  MSK no focal spinal tenderness, no peripheral edema  Neuro nonfocal  Breast exam: Deferred  Lab Results:      Chemistry      Component Value Date/Time   NA 138 02/17/2012 0902   NA 137 11/18/2011 1451   K 3.5 02/17/2012 0902   K 3.7 11/18/2011 1451   CL 109* 02/17/2012 0902   CL 106 11/18/2011 1451   CO2 22 02/17/2012 0902   CO2 25 11/18/2011 1451   BUN 6.0* 02/17/2012 0902   BUN 4* 11/18/2011 1451   CREATININE 0.7 02/17/2012 0902   CREATININE 0.66 11/18/2011 1451   CREATININE 0.73 09/25/2011 1234      Component Value Date/Time   CALCIUM 9.3 02/17/2012 0902   CALCIUM 9.4 11/18/2011 1451   ALKPHOS 113 02/17/2012 0902   ALKPHOS 56 11/18/2011 1451   AST 10 02/17/2012 0902   AST 10 11/18/2011 1451   ALT <6 Repeated and Verified 02/17/2012 0902   ALT <8 11/18/2011 1451   BILITOT 0.36 02/17/2012 0902   BILITOT 0.4 11/18/2011 1451       Lab Results  Component Value Date   WBC 4.8 02/17/2012   HGB 10.3* 02/17/2012   HCT 30.8* 02/17/2012   MCV 84.0 02/17/2012   PLT 208 02/17/2012   NEUTROABS 2.7 02/17/2012    Studies/Results:   Assessment: 32 year old female with   #1History of pulmonary embolism and currently [redacted] weeks pregnant. Patient is currently on anticoagulation with Lovenox 130 mg twice a day. Overall she is tolerating it well. Her low molecular weight heparin level was therapeutic today at 0.86.  #2 anemia likely due to iron deficiency my recommendation is for patient to begin taking oral iron on a daily basis.  Plan:  1. We are switching her to:  Heparin with a Parabon base at 17,500 units q12H.  This dosing was discussed with pharmacy, and Dr. Welton Flakes.   #2 We will see [name] back in 2 weeks.    The length of time of the face-to-face encounter was  30    minutes. More than 50% of time was spent counseling and coordination of care.      Cherie Ouch Lyn Hollingshead, NP Medical Oncology [facility] Phone: [phone]    Heather Vargas 02/17/2012

## 2012-02-17 NOTE — Telephone Encounter (Signed)
Tc to pt. Rx for Labetalol e-pres to pharm on file per AVS. Pt agrees.

## 2012-02-18 ENCOUNTER — Other Ambulatory Visit: Payer: BC Managed Care – PPO

## 2012-02-19 ENCOUNTER — Other Ambulatory Visit: Payer: Self-pay | Admitting: Obstetrics and Gynecology

## 2012-02-19 ENCOUNTER — Ambulatory Visit: Payer: BC Managed Care – PPO | Admitting: Obstetrics and Gynecology

## 2012-02-19 ENCOUNTER — Ambulatory Visit: Payer: BC Managed Care – PPO

## 2012-02-19 VITALS — BP 122/82 | Wt 283.0 lb

## 2012-02-19 DIAGNOSIS — I10 Essential (primary) hypertension: Secondary | ICD-10-CM

## 2012-02-19 DIAGNOSIS — Z3490 Encounter for supervision of normal pregnancy, unspecified, unspecified trimester: Secondary | ICD-10-CM

## 2012-02-19 DIAGNOSIS — R197 Diarrhea, unspecified: Secondary | ICD-10-CM

## 2012-02-19 DIAGNOSIS — I2699 Other pulmonary embolism without acute cor pulmonale: Secondary | ICD-10-CM

## 2012-02-19 DIAGNOSIS — D649 Anemia, unspecified: Secondary | ICD-10-CM

## 2012-02-19 NOTE — Progress Notes (Signed)
[redacted]w[redacted]d Pt c/o diarrhea s/p start of Heparin since 02/17/12.  Had 5 watery stools today.  Stomach feels upset.  No fever. No household or workplace illness.  Ultrasound shows:  SIUP  S=D     [place] EDD: 03/30/12            AFI: 12.59 cm  Efw=5lbs=30%           Cervical length: 3.41 cm           Placenta localization: posterior           Fetal presentation: cephalic                   Anatomy survey is normal     BPP 8/8 Rec:  CMP  Observe diarrhea for now.  If no improvement by 5 days, do c.difficile assay, ova and parasites  Weekly BPP  Next growth U/s at 38 weeks  Induction at 39 weeks recommended by MFM Anemia:  rec OTC iron bid

## 2012-02-20 ENCOUNTER — Telehealth: Payer: Self-pay

## 2012-02-20 LAB — COMPREHENSIVE METABOLIC PANEL
AST: 9 U/L (ref 0–37)
Alkaline Phosphatase: 111 U/L (ref 39–117)
BUN: 7 mg/dL (ref 6–23)
Creat: 0.67 mg/dL (ref 0.50–1.10)
Glucose, Bld: 80 mg/dL (ref 70–99)
Potassium: 3.9 mEq/L (ref 3.5–5.3)
Total Bilirubin: 0.4 mg/dL (ref 0.3–1.2)

## 2012-02-20 NOTE — Telephone Encounter (Signed)
Message copied by Raylene Everts on Thu Feb 20, 2012 10:09 AM ------      Message from: Dierdre Forth P      Created: Wed Feb 19, 2012  6:43 PM      Regarding: Needs OTC iron BID       Please notify of anemia and need for OTC iron

## 2012-02-20 NOTE — Telephone Encounter (Signed)
Lm on vm to cb per test results.  

## 2012-02-20 NOTE — Telephone Encounter (Signed)
Tc from pt per test results. Informed pt of [facility] recs to start iron BID. Pt states,"unable to tolerate oral iron tablets". Pt was informed about Floridex in the past;however pt unable to fine medication. Informed pt to try [employer]. Pt is taking a chewable vitamin with iron. Pt unable to tolerate a lot of foods listed on the Iron Rich Foods list. Told pt to cb if [employer] doesn't have medication. Pt voices understanding.

## 2012-02-21 LAB — US OB FOLLOW UP

## 2012-02-27 ENCOUNTER — Encounter: Payer: Self-pay | Admitting: Obstetrics and Gynecology

## 2012-02-27 ENCOUNTER — Other Ambulatory Visit: Payer: Self-pay

## 2012-02-27 ENCOUNTER — Ambulatory Visit: Payer: Medicaid Other | Admitting: Obstetrics and Gynecology

## 2012-02-27 ENCOUNTER — Ambulatory Visit: Payer: Medicaid Other

## 2012-02-27 VITALS — BP 104/62 | Wt 280.0 lb

## 2012-02-27 DIAGNOSIS — O10019 Pre-existing essential hypertension complicating pregnancy, unspecified trimester: Secondary | ICD-10-CM

## 2012-02-27 DIAGNOSIS — I10 Essential (primary) hypertension: Secondary | ICD-10-CM

## 2012-02-27 DIAGNOSIS — Z331 Pregnant state, incidental: Secondary | ICD-10-CM

## 2012-02-27 LAB — OB RESULTS CONSOLE GC/CHLAMYDIA: Gonorrhea: NEGATIVE

## 2012-02-27 NOTE — Progress Notes (Signed)
[redacted]w[redacted]d Pt feeling very tired and ready! Chronic HTN - Labetolol 100 mg BID On heparin for DVT and PE prophylaxis BPP 8/8, AFI WNL. GBS done today [place] at 38 weeks for growth, will continue weekly BPP Cx closed, long, vertex, -2 Will schedule induction at 39 weeks per MFM

## 2012-02-27 NOTE — Progress Notes (Signed)
[redacted]w[redacted]d No concerns per pt  BPP Ultrasound shows:               AFI: 14.1 cm = 50th %tile           Cervical length: not measured            Placenta localization: posterior           Fetal presentation: vertex

## 2012-02-28 LAB — GC/CHLAMYDIA PROBE AMP
CT Probe RNA: NEGATIVE
GC Probe RNA: NEGATIVE

## 2012-02-29 LAB — STREP B DNA PROBE: GBSP: NEGATIVE

## 2012-03-02 ENCOUNTER — Other Ambulatory Visit: Payer: Self-pay | Admitting: Emergency Medicine

## 2012-03-02 ENCOUNTER — Telehealth: Payer: Self-pay | Admitting: Oncology

## 2012-03-02 DIAGNOSIS — D649 Anemia, unspecified: Secondary | ICD-10-CM

## 2012-03-02 DIAGNOSIS — I2699 Other pulmonary embolism without acute cor pulmonale: Secondary | ICD-10-CM

## 2012-03-02 NOTE — Telephone Encounter (Signed)
S/w the pt and she is aware of her appts on 03/03/2012@2 :00pm for lab and md.

## 2012-03-03 ENCOUNTER — Other Ambulatory Visit: Payer: Self-pay | Admitting: Emergency Medicine

## 2012-03-03 ENCOUNTER — Other Ambulatory Visit (HOSPITAL_BASED_OUTPATIENT_CLINIC_OR_DEPARTMENT_OTHER): Payer: BC Managed Care – PPO | Admitting: Lab

## 2012-03-03 ENCOUNTER — Ambulatory Visit (HOSPITAL_BASED_OUTPATIENT_CLINIC_OR_DEPARTMENT_OTHER): Payer: BC Managed Care – PPO | Admitting: Oncology

## 2012-03-03 ENCOUNTER — Telehealth: Payer: Self-pay | Admitting: Oncology

## 2012-03-03 VITALS — BP 108/77 | HR 97 | Temp 98.0°F | Resp 20 | Ht 63.0 in | Wt 281.3 lb

## 2012-03-03 DIAGNOSIS — Z86718 Personal history of other venous thrombosis and embolism: Secondary | ICD-10-CM

## 2012-03-03 DIAGNOSIS — D649 Anemia, unspecified: Secondary | ICD-10-CM

## 2012-03-03 DIAGNOSIS — I2699 Other pulmonary embolism without acute cor pulmonale: Secondary | ICD-10-CM

## 2012-03-03 DIAGNOSIS — O99019 Anemia complicating pregnancy, unspecified trimester: Secondary | ICD-10-CM

## 2012-03-03 LAB — CBC WITH DIFFERENTIAL/PLATELET
Basophils Absolute: 0 10*3/uL (ref 0.0–0.1)
EOS%: 1.7 % (ref 0.0–7.0)
HCT: 32.6 % — ABNORMAL LOW (ref 34.8–46.6)
HGB: 10.8 g/dL — ABNORMAL LOW (ref 11.6–15.9)
MONO#: 0.2 10*3/uL (ref 0.1–0.9)
NEUT#: 4.1 10*3/uL (ref 1.5–6.5)
NEUT%: 65 % (ref 38.4–76.8)
RDW: 16.6 % — ABNORMAL HIGH (ref 11.2–14.5)
WBC: 6.3 10*3/uL (ref 3.9–10.3)
lymph#: 1.8 10*3/uL (ref 0.9–3.3)

## 2012-03-03 NOTE — Telephone Encounter (Signed)
gv pt appt schedule for February. °

## 2012-03-03 NOTE — Progress Notes (Signed)
ID: Heather Vargas  DOB: September 04, 1980  MR#: [id number]  CSN#: [id number]   Interval History:   Patient is seen in followup today overall she is doing well she is now [redacted] weeks gestation.  Her anemia has improved slightly.  She denies chest pain/palpitations, and remains short of breath on occasion.  She has had some hypertension with her pregnancy.  Otherwise, she's doing well.    No Known Allergies     Objective:  Filed Vitals:   03/03/12 1422  BP: 108/77  Pulse: 97  Temp: 98 F (36.7 C)  Resp: 20    BMI: Body mass index is 49.83 kg/(m^2).   ECOG FS: 1  Physical Exam:   Sclerae unicteric  Oropharynx clear  No peripheral adenopathy  Lungs clear -- no rales or rhonchi  Heart regular rate and rhythm  Abdomen benign  MSK no focal spinal tenderness, no peripheral edema  Neuro nonfocal  Breast exam: Deferred  Lab Results:      Chemistry      Component Value Date/Time   NA 138 02/19/2012 1736   NA 138 02/17/2012 0902   K 3.9 02/19/2012 1736   K 3.5 02/17/2012 0902   CL 106 02/19/2012 1736   CL 109* 02/17/2012 0902   CO2 20 02/19/2012 1736   CO2 22 02/17/2012 0902   BUN 7 02/19/2012 1736   BUN 6.0* 02/17/2012 0902   CREATININE 0.67 02/19/2012 1736   CREATININE 0.7 02/17/2012 0902   CREATININE 0.73 09/25/2011 1234      Component Value Date/Time   CALCIUM 9.2 02/19/2012 1736   CALCIUM 9.3 02/17/2012 0902   ALKPHOS 111 02/19/2012 1736   ALKPHOS 113 02/17/2012 0902   AST 9 02/19/2012 1736   AST 10 02/17/2012 0902   ALT <8 02/19/2012 1736   ALT <6 Repeated and Verified 02/17/2012 0902   BILITOT 0.4 02/19/2012 1736   BILITOT 0.36 02/17/2012 0902       Lab Results  Component Value Date   WBC 6.3 03/03/2012   HGB 10.8* 03/03/2012   HCT 32.6* 03/03/2012   MCV 84.1 03/03/2012   PLT 238 03/03/2012   NEUTROABS 4.1 03/03/2012    Studies/Results:   Assessment: 32 year old female with   #1History of pulmonary embolism and currently [redacted] weeks pregnant. Patient is currently on  anticoagulation with unfractionated heparin twice a day. Overall she is tolerating it well.   #2 anemia likely due to iron deficiency my recommendation is for patient to begin taking oral iron on a daily basis.  Plan:  1. Patient has been tolerating the unfractionated heparin very well. I did give her prescriptions refills.  #2 have discussed with her how to manage her anticoagulation with her upcoming delivery.  #3 she will continue the unfractionated heparin up until 6-12 hours prior to her delivery. Than her heparin should be discontinued.  #4 Once patient has safely deliver her baby then she should be started on Lovenox at 1 mg per kilogram twice a day.  #5 she will eventually need to be switched to Coumadin to try to keep her INR between 2 and 3. However we certainly do have to overlap the Lovenox along with Coumadin until her INR is therapeutic.  #6 I will plan on seeing the patient back in about 2 weeks' time in followup.  The length of time of the face-to-face encounter was  30    minutes. More than 50% of time was spent counseling and coordination of care.  Drue Second, MD Medical/Oncology [facility] [phone] (beeper) [phone] (Office)     Lamar, Konrad Dolores 03/03/2012

## 2012-03-03 NOTE — Patient Instructions (Addendum)
Continue the heparin 17,500 units twice a day  We will see you back on 03/30/12

## 2012-03-05 ENCOUNTER — Ambulatory Visit: Payer: Medicaid Other

## 2012-03-05 ENCOUNTER — Encounter: Payer: Self-pay | Admitting: Obstetrics and Gynecology

## 2012-03-05 ENCOUNTER — Telehealth: Payer: Self-pay | Admitting: Obstetrics and Gynecology

## 2012-03-05 ENCOUNTER — Other Ambulatory Visit: Payer: Medicaid Other

## 2012-03-05 ENCOUNTER — Ambulatory Visit: Payer: Medicaid Other | Admitting: Obstetrics and Gynecology

## 2012-03-05 VITALS — BP 128/72 | Wt 280.0 lb

## 2012-03-05 DIAGNOSIS — I10 Essential (primary) hypertension: Secondary | ICD-10-CM

## 2012-03-05 DIAGNOSIS — Z3490 Encounter for supervision of normal pregnancy, unspecified, unspecified trimester: Secondary | ICD-10-CM

## 2012-03-05 DIAGNOSIS — I2699 Other pulmonary embolism without acute cor pulmonale: Secondary | ICD-10-CM

## 2012-03-05 LAB — US OB FOLLOW UP

## 2012-03-05 NOTE — Telephone Encounter (Signed)
Induction scheduled for 03/23/12 @ 7:30pm with SR/VS. Merita Norton Pridgen

## 2012-03-05 NOTE — Progress Notes (Signed)
[redacted]w[redacted]d GBS negative   Ultrasound shows:  SIUP  S=D     [place] EDD: 03/30/12           EFW: 6 lb 1 oz 33.4%           AFI: 15.62           Cervical length: n/a           Placenta localization: posterior           Fetal presentation: vertex  BPP 8/8 in 10 minutes  1)  Hx PE: Now on heparin 17,500 units bid   Normal unfractionated heparin level on 03/03/12 2)Planned induction at 39 wks.  Right now will need cervical ripening

## 2012-03-09 ENCOUNTER — Telehealth (HOSPITAL_COMMUNITY): Payer: Self-pay | Admitting: *Deleted

## 2012-03-09 NOTE — Telephone Encounter (Signed)
Preadmission screen  

## 2012-03-13 ENCOUNTER — Other Ambulatory Visit: Payer: Self-pay

## 2012-03-13 ENCOUNTER — Ambulatory Visit: Payer: Medicaid Other

## 2012-03-13 ENCOUNTER — Encounter: Payer: Self-pay | Admitting: Obstetrics and Gynecology

## 2012-03-13 ENCOUNTER — Ambulatory Visit: Payer: Medicaid Other | Admitting: Obstetrics and Gynecology

## 2012-03-13 VITALS — BP 124/80 | Wt 279.0 lb

## 2012-03-13 DIAGNOSIS — Z331 Pregnant state, incidental: Secondary | ICD-10-CM

## 2012-03-13 DIAGNOSIS — I10 Essential (primary) hypertension: Secondary | ICD-10-CM

## 2012-03-13 NOTE — Progress Notes (Signed)
[redacted]w[redacted]d [place] AFI 15.38 cm BPP8/8 vtx CHTN pt stable on labetalol [place] with BPP and EFW @NV  H/o pumonary embolism pt is on heparin 17, 500 units q 12 hrs

## 2012-03-13 NOTE — Progress Notes (Signed)
Pt declines flu shot at this time.

## 2012-03-17 ENCOUNTER — Other Ambulatory Visit: Payer: Self-pay

## 2012-03-17 ENCOUNTER — Other Ambulatory Visit: Payer: Medicaid Other

## 2012-03-17 ENCOUNTER — Other Ambulatory Visit: Payer: Self-pay | Admitting: Obstetrics and Gynecology

## 2012-03-17 ENCOUNTER — Ambulatory Visit: Payer: Medicaid Other | Admitting: Obstetrics and Gynecology

## 2012-03-17 VITALS — BP 112/62 | Wt 282.0 lb

## 2012-03-17 DIAGNOSIS — I10 Essential (primary) hypertension: Secondary | ICD-10-CM

## 2012-03-17 DIAGNOSIS — D649 Anemia, unspecified: Secondary | ICD-10-CM

## 2012-03-17 DIAGNOSIS — Z9851 Tubal ligation status: Secondary | ICD-10-CM

## 2012-03-17 DIAGNOSIS — Z3490 Encounter for supervision of normal pregnancy, unspecified, unspecified trimester: Secondary | ICD-10-CM

## 2012-03-17 DIAGNOSIS — I2699 Other pulmonary embolism without acute cor pulmonale: Secondary | ICD-10-CM

## 2012-03-17 LAB — US OB FOLLOW UP

## 2012-03-17 NOTE — Progress Notes (Signed)
[redacted]w[redacted]d Pt complains of being nauseated starting this am. Ultrasound shows:  SIUP  S=D     [place] EDD: 03/30/2012           AFI: 13.6 cm =50th% tile           Cervical length: not measured            Placenta localization: posterior           Fetal presentation: Vertex                   Anatomy survey is normal           Gender : female                             EFW=32% tile for [redacted]w[redacted]d.                             Note: AC is lagging 2 wks behind.                             FL is 4 wks behind.                             Technically difficult study due to maternal habitus and advanced GA.                             Score BPP 8 of 8 in 18 minutes.  Taking twice daily heparin.  Last dose will be 03/22/12 at 8pm Scheduled for induction at 730pm on 03/23/12

## 2012-03-18 ENCOUNTER — Other Ambulatory Visit: Payer: Medicaid Other

## 2012-03-18 ENCOUNTER — Encounter: Payer: Medicaid Other | Admitting: Obstetrics and Gynecology

## 2012-03-19 ENCOUNTER — Other Ambulatory Visit: Payer: Self-pay | Admitting: Adult Health

## 2012-03-23 ENCOUNTER — Inpatient Hospital Stay (HOSPITAL_COMMUNITY)
Admission: RE | Admit: 2012-03-23 | Discharge: 2012-03-26 | DRG: 372 | Disposition: A | Discharge status: Home / Self Care | Payer: BC Managed Care – PPO | Source: Ambulatory Visit | Attending: Obstetrics and Gynecology | Admitting: Obstetrics and Gynecology

## 2012-03-23 ENCOUNTER — Encounter (HOSPITAL_COMMUNITY): Payer: Self-pay

## 2012-03-23 VITALS — BP 139/83 | HR 88 | Temp 97.8°F | Resp 18 | Ht 62.0 in | Wt 280.0 lb

## 2012-03-23 DIAGNOSIS — O9902 Anemia complicating childbirth: Secondary | ICD-10-CM | POA: Diagnosis present

## 2012-03-23 DIAGNOSIS — E669 Obesity, unspecified: Secondary | ICD-10-CM | POA: Diagnosis present

## 2012-03-23 DIAGNOSIS — O99214 Obesity complicating childbirth: Secondary | ICD-10-CM | POA: Diagnosis present

## 2012-03-23 DIAGNOSIS — Z9851 Tubal ligation status: Secondary | ICD-10-CM

## 2012-03-23 DIAGNOSIS — D649 Anemia, unspecified: Secondary | ICD-10-CM | POA: Diagnosis present

## 2012-03-23 DIAGNOSIS — I2699 Other pulmonary embolism without acute cor pulmonale: Secondary | ICD-10-CM

## 2012-03-23 DIAGNOSIS — O1002 Pre-existing essential hypertension complicating childbirth: Principal | ICD-10-CM | POA: Diagnosis present

## 2012-03-23 DIAGNOSIS — Z86711 Personal history of pulmonary embolism: Secondary | ICD-10-CM

## 2012-03-23 LAB — CBC
Hemoglobin: 11.1 g/dL — ABNORMAL LOW (ref 12.0–15.0)
MCH: 27.5 pg (ref 26.0–34.0)
MCV: 86.1 fL (ref 78.0–100.0)
RBC: 4.03 MIL/uL (ref 3.87–5.11)
WBC: 7.8 10*3/uL (ref 4.0–10.5)

## 2012-03-23 LAB — COMPREHENSIVE METABOLIC PANEL
AST: 15 U/L (ref 0–37)
Albumin: 3.2 g/dL — ABNORMAL LOW (ref 3.5–5.2)
Alkaline Phosphatase: 159 U/L — ABNORMAL HIGH (ref 39–117)
BUN: 8 mg/dL (ref 6–23)
CO2: 19 mEq/L (ref 19–32)
Chloride: 100 mEq/L (ref 96–112)
Creatinine, Ser: 0.78 mg/dL (ref 0.50–1.10)
GFR calc non Af Amer: >90 mL/min (ref >90)
Potassium: 3.8 mEq/L (ref 3.5–5.1)
Total Bilirubin: 0.4 mg/dL (ref 0.3–1.2)

## 2012-03-23 LAB — URINALYSIS, ROUTINE W REFLEX MICROSCOPIC
Glucose, UA: NEGATIVE mg/dL
Hgb urine dipstick: NEGATIVE
Ketones, ur: NEGATIVE mg/dL
Protein, ur: NEGATIVE mg/dL
Urobilinogen, UA: 0.2 mg/dL (ref 0.0–1.0)

## 2012-03-23 LAB — URIC ACID: Uric Acid, Serum: 8.1 mg/dL — ABNORMAL HIGH (ref 2.4–7.0)

## 2012-03-23 MED ORDER — ONDANSETRON HCL 4 MG/2ML IJ SOLN: 4.0000 mg | Freq: Four times a day (QID) | INTRAMUSCULAR | Status: DC | PRN

## 2012-03-23 MED ORDER — OXYTOCIN 40 UNITS IN LACTATED RINGERS INFUSION - SIMPLE MED: 62.5000 mL/h | INTRAVENOUS | Status: DC

## 2012-03-23 MED ORDER — OXYTOCIN 40 UNITS IN LACTATED RINGERS INFUSION - SIMPLE MED: 1.0000 m[IU]/min | INTRAVENOUS | Status: DC

## 2012-03-23 MED ORDER — HYDROXYZINE HCL 50 MG/ML IM SOLN: 50.0000 mg | Freq: Four times a day (QID) | INTRAMUSCULAR | Status: DC | PRN

## 2012-03-23 MED ORDER — HYDROXYZINE HCL 50 MG PO TABS: 50.0000 mg | ORAL_TABLET | Freq: Four times a day (QID) | ORAL | Status: DC | PRN

## 2012-03-23 MED ORDER — LABETALOL HCL 5 MG/ML IV SOLN
20.0000 mg | Freq: Once | INTRAVENOUS | Status: DC | PRN
  Filled 2012-03-23: qty 4

## 2012-03-23 MED ORDER — LABETALOL HCL 5 MG/ML IV SOLN: 10.0000 mg | Freq: Once | INTRAVENOUS | Status: AC | PRN

## 2012-03-23 MED ORDER — LABETALOL HCL 5 MG/ML IV SOLN
40.0000 mg | Freq: Once | INTRAVENOUS | Status: DC | PRN
  Filled 2012-03-23: qty 8

## 2012-03-23 MED ORDER — IBUPROFEN 600 MG PO TABS: 600.0000 mg | ORAL_TABLET | Freq: Four times a day (QID) | ORAL | Status: DC | PRN

## 2012-03-23 MED ORDER — OXYTOCIN BOLUS FROM INFUSION: 500.0000 mL | INTRAVENOUS | Status: DC

## 2012-03-23 MED ORDER — ACETAMINOPHEN 325 MG PO TABS: 650.0000 mg | ORAL_TABLET | ORAL | Status: DC | PRN

## 2012-03-23 MED ORDER — CITRIC ACID-SODIUM CITRATE 334-500 MG/5ML PO SOLN: 30.0000 mL | ORAL | Status: DC | PRN

## 2012-03-23 MED ORDER — OXYCODONE-ACETAMINOPHEN 5-325 MG PO TABS: 1.0000 | ORAL_TABLET | ORAL | Status: DC | PRN

## 2012-03-23 MED ORDER — TERBUTALINE SULFATE 1 MG/ML IJ SOLN: 0.2500 mg | Freq: Once | INTRAMUSCULAR | Status: AC | PRN

## 2012-03-23 MED ORDER — LACTATED RINGERS IV SOLN: 500.0000 mL | INTRAVENOUS | Status: DC | PRN

## 2012-03-23 MED ORDER — LIDOCAINE HCL (PF) 1 % IJ SOLN
30.0000 mL | INTRAMUSCULAR | Status: DC | PRN
  Filled 2012-03-23: qty 30

## 2012-03-23 MED ORDER — LACTATED RINGERS IV SOLN: INTRAVENOUS | Status: DC

## 2012-03-23 MED ORDER — LABETALOL HCL 200 MG PO TABS
200.0000 mg | ORAL_TABLET | Freq: Two times a day (BID) | ORAL | Status: DC
  Administered 2012-03-23: 200 mg via ORAL
  Filled 2012-03-23 (×2): qty 1

## 2012-03-23 MED ORDER — FLEET ENEMA 7-19 GM/118ML RE ENEM: 1.0000 | ENEMA | RECTAL | Status: DC | PRN

## 2012-03-23 NOTE — H&P (Signed)
HPI: Heather Vargas is a 32 y.o. year old G10P2002 female at [redacted]w[redacted]d weeks gestation by LMP, confirmed w/ 8 week [place] who presents for scheduled induction of labor for [facility], Pulmonary Embolus.  She was Dx w/ PE at [redacted] weeks gestation after experiencing right LE swelling and SOB after travel. She has been followed by Dr. Park Breed, Hematologist. On Lovenox until 36 weeks, then Heparin, last dose 03/22/12 at 2100. She started [facility] at 8 weeks at [facility]. Growth ultrasounds, antenatal testing, MFM consult done. Spotting episode at 31 weeks. Dx VVC. Normal growth US's until AC lag 2 weeks and LF lag 4 weeks noted at 38 week [place].   Patient Active Problem List  Diagnosis  . Chest pain on breathing  . SOB (shortness of breath)  . PCOS (polycystic ovarian syndrome)  . PE (pulmonary embolism)  . HTN (hypertension)-No 24 hour Urine on chart. No significant proteinuria on UA's  . Morbidly obese  . Pregnancy  . Desires tubal ligation  . Anemia    History OB History   Grav Para Term Preterm Abortions TAB SAB Ect Mult Living   3 2 2       2      Past Medical History  Diagnosis Date  . H/O varicella   . H/O migraine   . Yeast infection   . Bacterial infection   . Trichomonas     as a teen  . Irregular menstrual cycle 2012  . Increased BMI 12/11/10  . Vitamin D deficiency 12/11/10  . History of PCOS 12/11/10  . Diabetes mellitus-borderline. (Hgb A1C 6.0, GTT normal x 2)     Metformin Rx'd by AR;D/C'd by Lucien Mons  . Headache     Migraines;was on Topamax last year;no longer taking  . PMS (premenstrual syndrome) 04/09/11    was Rx'd Prozac  . Anemia     during 1st pregnancy;iron supp taken  . Hypertension     on Labetolol 100mg  bid  . Pulmonary embolism 2013    on Lovenox 130mg  bid  . Morbidly obese 09/10/2011  . Pregnancy 09/25/2011   Past Surgical History  Procedure Laterality Date  . No past surgeries    . Tooth extraction     Family History: family history includes Cancer in her paternal grandmother and  paternal uncle; Diabetes in her maternal grandmother; Hypertension in her father and mother; Seizures in her son; Stroke in her maternal grandmothers; and Thyroid disease in her maternal grandmother and paternal grandmother. Social History:  reports that she has never smoked. She has never used smokeless tobacco. She reports that  drinks alcohol. She reports that she does not use illicit drugs.   Prenatal Transfer Tool  Maternal Diabetes: No Genetic Screening: Normal Maternal Ultrasounds/Referrals: Abnormal:  Findings:   Other: Fetal Ultrasounds or other Referrals:  None Maternal Substance Abuse:  No Significant Maternal Medications:  Meds include: Other:  Significant Maternal Lab Results:  Lab values include: Group B Strep negative Other Comments:  Borderline DM-A1C 6.0. Normal GTT's. On Lovenox, Heparin. CHTN-Labetalol. AC lag 2 weeks. Femur length lag  weeks.   Review of Systems  Constitutional: Negative for fever and chills.  Eyes: Negative for blurred vision.  Respiratory: Negative for cough, hemoptysis and shortness of breath.   Cardiovascular: Negative for chest pain, palpitations and leg swelling.  Gastrointestinal: Negative for abdominal pain.  Neurological: Positive for headaches (Similar to HA's experineced prior to  and throughout pregnancy. Mild-mod, generalized.).  Endo/Heme/Allergies: Does not bruise/bleed easily.    Dilation:  1.5 Effacement (%): 50 Station: -2 Exam by:: V. Duong Haydel CNM Blood pressure 128/88, pulse 95, resp. rate 18, height 5\' 2"  (1.575 m), weight 127.007 kg (280 lb), last menstrual period 06/24/2011. Maternal Exam:  Uterine Assessment: Contraction strength is mild.  Contraction frequency is irregular.   Abdomen: Fundal height is S=D.   Estimated fetal weight is 6-5 1 week ago.   Fetal presentation: vertex  Introitus: Normal vulva. Vulva is negative for lesion.  Pelvis: adequate for delivery.   Cervix: Cervix evaluated by digital exam.     Fetal  Exam Fetal Monitor Review: Mode: ultrasound.   Baseline rate: 145.  Variability: moderate (6-25 bpm).   Pattern: accelerations present and no decelerations.    Fetal State Assessment: Category I - tracings are normal.     Physical Exam  Nursing note and vitals reviewed. Constitutional: She is oriented to person, place, and time. She appears well-developed and well-nourished. No distress.  HENT:  Head: Normocephalic.  Cardiovascular: Normal rate, regular rhythm and normal heart sounds.   Respiratory: Effort normal and breath sounds normal.  GI: Soft. There is no tenderness.  Genitourinary: Vagina normal and uterus normal. There is no lesion on the right labia. There is no lesion on the left labia. Vulva exhibits no lesion. No bleeding around the vagina.  Musculoskeletal: Normal range of motion. She exhibits no edema and no tenderness.       Right lower leg: She exhibits no tenderness.  Neurological: She is alert and oriented to person, place, and time. She has normal reflexes.  Skin: Skin is warm and dry.  Psychiatric: She has a normal mood and affect.    Prenatal labs: ABO, Rh: A/POS/-- (07/19 1600) Antibody: NEG (07/19 1600) Rubella: 65.5 (07/19 1600) RPR: NON REAC (11/27 1103)  HBsAg: NEGATIVE (07/19 1600)  HIV: NON REACTIVE (07/19 1600)  GBS: NEGATIVE (01/23 1230)  1 hour GTT 84 early, 114 at 28 weeks Hgb A1C 6.0 First trimester screen normal.  Assessment: 1. Labor: IOL 2. Fetal Wellbeing: Category I  3. Pain Control: None. Plans IV pain meds 4. GBS: Neg 5. 39 week IUP 6. Hx PE 7. CHTN, well-controlled on Labetalol. No evidence of Pre-eclampsia.  8. Borderline DM. Normal GTTs. No Tx.  9. Plans BTL. Consent signed at 28 weeks, on chart.  Plan:  1. Admit to BS per consult with MD 2. Routine L&D orders 3. Analgesia/anesthesia PRN  4. PIH labs, 24 hour Urine, Increase labetalol to 200 mg PO BID. IV Labetalol for SBP >160, DBP >105 per Dr. Estanislado Pandy.  5. BTL PP 6.  Consult anesthesia per Dr. Estanislado Pandy. 7. Per Dr. Park Breed, pt should be started on Lovenox at 1 mg per kilogram twice a day after delivery. MFM recommends starting 6 hour after SVD, 12 hours after C/S.  8. Foley bulb, then pitocin.   Dorathy Kinsman 03/23/2012, 10:40 PM

## 2012-03-24 ENCOUNTER — Encounter (HOSPITAL_COMMUNITY): Payer: Self-pay | Admitting: Anesthesiology

## 2012-03-24 ENCOUNTER — Inpatient Hospital Stay (HOSPITAL_COMMUNITY): Payer: BC Managed Care – PPO | Admitting: Anesthesiology

## 2012-03-24 ENCOUNTER — Encounter (HOSPITAL_COMMUNITY): Payer: Self-pay

## 2012-03-24 LAB — SURGICAL PCR SCREEN: MRSA, PCR: NEGATIVE

## 2012-03-24 LAB — CBC
HCT: 31.9 % — ABNORMAL LOW (ref 36.0–46.0)
MCH: 27.2 pg (ref 26.0–34.0)
MCHC: 31.7 g/dL (ref 30.0–36.0)
MCV: 86 fL (ref 78.0–100.0)
Platelets: 221 10*3/uL (ref 150–400)
RDW: 15.9 % — ABNORMAL HIGH (ref 11.5–15.5)

## 2012-03-24 LAB — PROTEIN / CREATININE RATIO, URINE
Protein Creatinine Ratio: 0.1 (ref 0.00–0.15)
Total Protein, Urine: 4.7 mg/dL

## 2012-03-24 LAB — URINALYSIS, ROUTINE W REFLEX MICROSCOPIC
Bilirubin Urine: NEGATIVE
Glucose, UA: NEGATIVE mg/dL
Hgb urine dipstick: NEGATIVE
Protein, ur: NEGATIVE mg/dL
Urobilinogen, UA: 0.2 mg/dL (ref 0.0–1.0)

## 2012-03-24 MED ORDER — LANOLIN HYDROUS EX OINT: TOPICAL_OINTMENT | CUTANEOUS | Status: DC | PRN

## 2012-03-24 MED ORDER — PHENYLEPHRINE 40 MCG/ML (10ML) SYRINGE FOR IV PUSH (FOR BLOOD PRESSURE SUPPORT): 80.0000 ug | PREFILLED_SYRINGE | INTRAVENOUS | Status: DC | PRN

## 2012-03-24 MED ORDER — DIPHENHYDRAMINE HCL 25 MG PO CAPS
25.0000 mg | ORAL_CAPSULE | Freq: Four times a day (QID) | ORAL | Status: DC | PRN
  Filled 2012-03-24: qty 1

## 2012-03-24 MED ORDER — LACTATED RINGERS IV SOLN: 500.0000 mL | Freq: Once | INTRAVENOUS | Status: DC

## 2012-03-24 MED ORDER — IBUPROFEN 600 MG PO TABS
600.0000 mg | ORAL_TABLET | Freq: Four times a day (QID) | ORAL | Status: DC | PRN
  Administered 2012-03-24 – 2012-03-26 (×5): 600 mg via ORAL
  Filled 2012-03-24 (×6): qty 1

## 2012-03-24 MED ORDER — DIPHENHYDRAMINE HCL 50 MG/ML IJ SOLN: 12.5000 mg | INTRAMUSCULAR | Status: DC | PRN

## 2012-03-24 MED ORDER — LABETALOL HCL 100 MG PO TABS
100.0000 mg | ORAL_TABLET | Freq: Two times a day (BID) | ORAL | Status: DC
  Administered 2012-03-24 – 2012-03-26 (×5): 100 mg via ORAL
  Filled 2012-03-24 (×7): qty 1

## 2012-03-24 MED ORDER — MAGNESIUM HYDROXIDE 400 MG/5ML PO SUSP: 30.0000 mL | ORAL | Status: DC | PRN

## 2012-03-24 MED ORDER — TETANUS-DIPHTH-ACELL PERTUSSIS 5-2.5-18.5 LF-MCG/0.5 IM SUSP: 0.5000 mL | Freq: Once | INTRAMUSCULAR | Status: DC

## 2012-03-24 MED ORDER — IBUPROFEN 600 MG PO TABS: 600.0000 mg | ORAL_TABLET | Freq: Four times a day (QID) | ORAL | Status: DC

## 2012-03-24 MED ORDER — NALBUPHINE SYRINGE 5 MG/0.5 ML
10.0000 mg | INJECTION | INTRAMUSCULAR | Status: DC | PRN
  Administered 2012-03-24: 10 mg via INTRAVENOUS
  Filled 2012-03-24: qty 1
  Filled 2012-03-24 (×2): qty 0.5

## 2012-03-24 MED ORDER — OXYCODONE-ACETAMINOPHEN 5-325 MG PO TABS
1.0000 | ORAL_TABLET | ORAL | Status: DC | PRN
Start: 2012-03-24 — End: 2012-03-26

## 2012-03-24 MED ORDER — DIBUCAINE 1 % RE OINT: 1.0000 "application " | TOPICAL_OINTMENT | RECTAL | Status: DC | PRN

## 2012-03-24 MED ORDER — ONDANSETRON HCL 4 MG/2ML IJ SOLN: 4.0000 mg | INTRAMUSCULAR | Status: DC | PRN

## 2012-03-24 MED ORDER — SENNOSIDES-DOCUSATE SODIUM 8.6-50 MG PO TABS
2.0000 | ORAL_TABLET | Freq: Every day | ORAL | Status: DC
  Administered 2012-03-24: 2 via ORAL

## 2012-03-24 MED ORDER — FAMOTIDINE 20 MG PO TABS
40.0000 mg | ORAL_TABLET | Freq: Once | ORAL | Status: AC
  Administered 2012-03-25: 40 mg via ORAL
  Filled 2012-03-24 (×2): qty 1

## 2012-03-24 MED ORDER — EPHEDRINE 5 MG/ML INJ: 10.0000 mg | INTRAVENOUS | Status: DC | PRN

## 2012-03-24 MED ORDER — FENTANYL CITRATE 0.05 MG/ML IJ SOLN
100.0000 ug | INTRAMUSCULAR | Status: DC | PRN
  Administered 2012-03-24 (×4): 100 ug via INTRAVENOUS
  Filled 2012-03-24 (×4): qty 2

## 2012-03-24 MED ORDER — BENZOCAINE-MENTHOL 20-0.5 % EX AERO
1.0000 "application " | INHALATION_SPRAY | CUTANEOUS | Status: DC | PRN
  Administered 2012-03-24: 1 via TOPICAL
  Filled 2012-03-24: qty 56

## 2012-03-24 MED ORDER — LIDOCAINE HCL (PF) 1 % IJ SOLN
INTRAMUSCULAR | Status: AC | PRN
  Administered 2012-03-24 (×2): 5 mL

## 2012-03-24 MED ORDER — ONDANSETRON HCL 4 MG PO TABS: 4.0000 mg | ORAL_TABLET | ORAL | Status: DC | PRN

## 2012-03-24 MED ORDER — ZOLPIDEM TARTRATE 5 MG PO TABS: 5.0000 mg | ORAL_TABLET | Freq: Every evening | ORAL | Status: DC | PRN

## 2012-03-24 MED ORDER — PRENATAL MULTIVITAMIN CH
1.0000 | ORAL_TABLET | Freq: Every day | ORAL | Status: DC
  Administered 2012-03-25 – 2012-03-26 (×2): 1 via ORAL
  Filled 2012-03-24 (×2): qty 1

## 2012-03-24 MED ORDER — WITCH HAZEL-GLYCERIN EX PADS: 1.0000 "application " | MEDICATED_PAD | CUTANEOUS | Status: DC | PRN

## 2012-03-24 MED ORDER — METOCLOPRAMIDE HCL 10 MG PO TABS
10.0000 mg | ORAL_TABLET | Freq: Once | ORAL | Status: AC
  Administered 2012-03-25: 10 mg via ORAL
  Filled 2012-03-24: qty 1

## 2012-03-24 MED ORDER — SIMETHICONE 80 MG PO CHEW: 80.0000 mg | CHEWABLE_TABLET | ORAL | Status: DC | PRN

## 2012-03-24 MED ORDER — OXYTOCIN 40 UNITS IN LACTATED RINGERS INFUSION - SIMPLE MED
1.0000 m[IU]/min | INTRAVENOUS | Status: DC
  Administered 2012-03-24: 1 m[IU]/min via INTRAVENOUS
  Filled 2012-03-24: qty 1000

## 2012-03-24 MED ORDER — LACTATED RINGERS IV SOLN
INTRAVENOUS | Status: DC
  Administered 2012-03-25: 07:00:00 via INTRAVENOUS

## 2012-03-24 MED ORDER — PHENYLEPHRINE 40 MCG/ML (10ML) SYRINGE FOR IV PUSH (FOR BLOOD PRESSURE SUPPORT)
80.0000 ug | PREFILLED_SYRINGE | INTRAVENOUS | Status: DC | PRN
  Filled 2012-03-24: qty 5

## 2012-03-24 MED ORDER — EPHEDRINE 5 MG/ML INJ
10.0000 mg | INTRAVENOUS | Status: DC | PRN
  Filled 2012-03-24: qty 4

## 2012-03-24 MED ORDER — FENTANYL 2.5 MCG/ML BUPIVACAINE 1/10 % EPIDURAL INFUSION (WH - ANES)
14.0000 mL/h | INTRAMUSCULAR | Status: DC
  Administered 2012-03-24: 14 mL/h via EPIDURAL
  Filled 2012-03-24: qty 125

## 2012-03-24 NOTE — Progress Notes (Signed)
Delivery of live viable female by H. Steelman, CNM APGARs 8,9

## 2012-03-24 NOTE — Progress Notes (Addendum)
Heather Vargas is a 32 y.o. G3P2002 at [redacted]w[redacted]d. Foley bulb out at 0350.    Subjective: Requesting more IV pain meds.  Objective: BP 111/80  Pulse 97  Resp 20  Ht 5\' 2"  (1.575 m)  Wt 127.007 kg (280 lb)  BMI 51.2 kg/m2  LMP 06/24/2011     FHT:  FHR: 135 bpm, variability: minimal ,  accelerations: 10x10,  decelerations:  Present rare decels, unable to determine timing w/ UC's.  UC:   Difficult to assess due to maternal body habitus. Palpate moderate to strong. Good descent of vertex during SVE.  SVE:   Dilation: 6 Effacement (%): 80 Station: -2 Exam by:: v Heather Vargas cnm AROM moderate amount of clear fluid. Attempted to place IUPC. Unable to advance. Procedure stopped. IFSE placed.  Labs: Lab Results  Component Value Date   WBC 7.8 03/23/2012   HGB 11.1* 03/23/2012   HCT 34.7* 03/23/2012   MCV 86.1 03/23/2012   PLT 268 03/23/2012    Assessment / Plan: Induction of labor due to [facility], Hx PE.,  progressing well w/ foley bulb.  Labor: Progressing normally Preeclampsia:  labs stable Fetal Wellbeing:  Category II Pain Control:  Fentanyl I/D:  n/a Anticipated MOD:  NSVD Will not start pitocin now due to good labor progress and difficulty tracing contractions. If no further progress at next exam, will attempt IUPC placement again at start pitocin.  Encouraged RN to adjust toco.  Anesthesia consulted due to pt having been on Heparin. Pt may have epidural at any time. No dry cath needed.  Heather Vargas 03/24/2012, 4:41 AM

## 2012-03-24 NOTE — Progress Notes (Signed)
Heather Vargas is a 32 y.o. G3P2002 at [redacted]w[redacted]d  admitted for induction of labor due to Hypertension and PE early 1st trimester.  Subjective: Pt desires add'l pain medication; currently not interested in epidural.  Pt's mother at [facility] & supportive.   Pitocin currently on 1mu; started at 0630.  Last received Fentanyl at 0611. Objective: BP 133/86  Pulse 93  Temp(Src) 98.6 F (37 C) (Oral)  Resp 18  Ht 5\' 2"  (1.575 m)  Wt 280 lb (127.007 kg)  BMI 51.2 kg/m2  LMP 06/24/2011      FHT:  FHR: 135 bpm, variability: moderate,  accelerations:  Present,  decelerations:  Present intermittent lates UC:   regular, every 2-3 minutes SVE:   Dilation: 6 Effacement (%): 70 Station: -2 Exam by:: H. Dontarius Sheley, CNM Posterior cx; fetal head asynclitic but difficult to tell if position.  IUPC inserted 7 o'clock.  Labs: Lab Results  Component Value Date   WBC 7.8 03/23/2012   HGB 11.1* 03/23/2012   HCT 34.7* 03/23/2012   MCV 86.1 03/23/2012   PLT 268 03/23/2012    Assessment / Plan: 1. [redacted]w[redacted]d 2. IOL for [facility] & h/o PE 3. intermittent lates 4. GBS neg   Labor: no cervical change since AROM Preeclampsia:  labs stable Fetal Wellbeing:  Category II Pain Control:  will try nubain now; received 4 doses of Fentanyl overnight I/D:  n/a Anticipated MOD:  NSVD 1. Enc'd pt to try more tilted and lateral positions now that IUPC in place.  Will titrate Pitocin as needed and FHT tolerates if MVU's inadequate. 2. C/w MD prn. Belle Charlie H 03/24/2012, 7:55 AM

## 2012-03-24 NOTE — Progress Notes (Signed)
Subjective: Pt received epidural around 0900 and got comfortable, but currently having hot spot on Rt side.  Several visitors at bedside.  Pitocin on 2mu at present.  PCAE has been pushed twice since new onset pain.   Objective: BP 99/70  Pulse 106  Temp(Src) 98.6 F (37 C) (Oral)  Resp 18  Ht 5\' 2"  (1.575 m)  Wt 280 lb (127.007 kg)  BMI 51.2 kg/m2  LMP 06/24/2011      FHT:  FHR: 135 bpm, variability: moderate,  accelerations:  Present,  decelerations:  Present run of lates around 0955, improved w/ pt on her back;  occ'l early/variable UC:   regular, every 2-3 minutes SVE:   Dilation: 6 Effacement (%): 70 Station: -2 Exam by:: Eustace Pen, CNM Rechecked by RN just after epidural and no change: MVU's adequate since IUPC inserted around 0745 Labs: Lab Results  Component Value Date   WBC 8.0 03/24/2012   HGB 10.1* 03/24/2012   HCT 31.9* 03/24/2012   MCV 86.0 03/24/2012   PLT 221 03/24/2012    Assessment / Plan: 1. [redacted]w[redacted]d 2. IOL for [facility] & PE 3. GBS neg  Labor: latent labor Preeclampsia:  labs stable Fetal Wellbeing:  Category II Pain Control:  Epidural I/D:  n/a Anticipated MOD:  NSVD 1. CTO FHT closely; will recheck cx prn. 2. Anesthesia consulted r/e possible redose 3. C/w Dr. Su Hilt prn Carolanne Grumbling H 03/24/2012, 10:24 AM

## 2012-03-24 NOTE — Anesthesia Preprocedure Evaluation (Addendum)
Anesthesia Evaluation  Patient identified by MRN, date of birth, ID band Patient awake    Reviewed: Allergy & Precautions, H&P , Patient's Chart, lab work & pertinent test results  Airway Mallampati: IV TM Distance: >3 FB Neck ROM: full    Dental no notable dental hx.    Pulmonary shortness of breath,  breath sounds clear to auscultation  Pulmonary exam normal       Cardiovascular hypertension, Rhythm:regular Rate:Normal     Neuro/Psych  Headaches, PSYCHIATRIC DISORDERS Depression negative neurological ROS  negative psych ROS   GI/Hepatic negative GI ROS, Neg liver ROS,   Endo/Other  diabetes  Renal/GU negative Renal ROS     Musculoskeletal   Abdominal   Peds  Hematology negative hematology ROS (+) anemia ,   Anesthesia Other Findings H/O varicella     H/O migraine        Yeast infection     Bacterial infection        Trichomonas   as a teen Irregular menstrual cycle 2012      Increased BMI 12/11/10   Vitamin D deficiency 12/11/10      History of PCOS 12/11/10   Diabetes mellitus   Metformin Rx'd by AR;D/C'd by Lucien Mons    Headache   Migraines;was on Topamax last year;no longer taking PMS (premenstrual syndrome) 04/09/11 was Rx'd Prozac    Anemia   during 1st pregnancy;iron supp taken Hypertension   on Labetolol 100mg  bid    Pulmonary embolism 2013 on Lovenox 130mg  bid Morbidly obese 09/10/2011      Pregnancy 09/25/2011               Reproductive/Obstetrics                         Anesthesia Physical Anesthesia Plan  ASA: III  Anesthesia Plan: Epidural   Post-op Pain Management:    Induction:   Airway Management Planned:   Additional Equipment:   Intra-op Plan:   Post-operative Plan:   Informed Consent: I have reviewed the patients History and Physical, chart, labs and discussed the procedure including the risks, benefits and alternatives for the proposed anesthesia with the patient or  authorized representative who has indicated his/her understanding and acceptance.     Plan Discussed with:   Anesthesia Plan Comments:        been off all heparin for >24 hours.  Will check ptt prior to placement of epidural Anesthesia Quick Evaluation

## 2012-03-24 NOTE — Anesthesia Procedure Notes (Signed)

## 2012-03-25 ENCOUNTER — Encounter (HOSPITAL_COMMUNITY)
Admission: RE | Disposition: A | Discharge status: Home / Self Care | Payer: Self-pay | Source: Ambulatory Visit | Attending: Obstetrics and Gynecology

## 2012-03-25 LAB — CBC
Hemoglobin: 9.8 g/dL — ABNORMAL LOW (ref 12.0–15.0)
MCH: 27.2 pg (ref 26.0–34.0)
MCHC: 31.8 g/dL (ref 30.0–36.0)
MCV: 85.6 fL (ref 78.0–100.0)

## 2012-03-25 SURGERY — LIGATION, FALLOPIAN TUBE, POSTPARTUM
Anesthesia: Epidural | Laterality: Bilateral

## 2012-03-25 MED ORDER — BUPIVACAINE HCL (PF) 0.25 % IJ SOLN
INTRAMUSCULAR | Status: AC
Start: 2012-03-25 — End: 2012-03-25
  Filled 2012-03-25: qty 30

## 2012-03-25 MED ORDER — ENOXAPARIN SODIUM 150 MG/ML ~~LOC~~ SOLN
1.0000 mg/kg | Freq: Two times a day (BID) | SUBCUTANEOUS | Status: DC
  Administered 2012-03-25 – 2012-03-26 (×3): 130 mg via SUBCUTANEOUS
  Filled 2012-03-25 (×5): qty 1

## 2012-03-25 MED ORDER — BUPIVACAINE HCL (PF) 0.25 % IJ SOLN
INTRAMUSCULAR | Status: AC
  Filled 2012-03-25: qty 30

## 2012-03-25 SURGICAL SUPPLY — 25 items
BENZOIN TINCTURE PRP APPL 2/3 (GAUZE/BANDAGES/DRESSINGS) IMPLANT
CHLORAPREP W/TINT 26ML (MISCELLANEOUS) IMPLANT
CLOTH BEACON ORANGE TIMEOUT ST (SAFETY) IMPLANT
CONTAINER PREFILL 10% NBF 15ML (MISCELLANEOUS) IMPLANT
DERMABOND ADVANCED (GAUZE/BANDAGES/DRESSINGS)
DERMABOND ADVANCED .7 DNX12 (GAUZE/BANDAGES/DRESSINGS) IMPLANT
ELECT REM PT RETURN 9FT ADLT (ELECTROSURGICAL)
ELECTRODE REM PT RTRN 9FT ADLT (ELECTROSURGICAL) IMPLANT
GLOVE BIO SURGEON STRL SZ7.5 (GLOVE) IMPLANT
GLOVE BIOGEL PI IND STRL 7.5 (GLOVE) IMPLANT
GLOVE BIOGEL PI INDICATOR 7.5 (GLOVE)
GOWN PREVENTION PLUS LG XLONG (DISPOSABLE) IMPLANT
NEEDLE HYPO 25X1 1.5 SAFETY (NEEDLE) IMPLANT
NS IRRIG 1000ML POUR BTL (IV SOLUTION) IMPLANT
PACK ABDOMINAL MINOR (CUSTOM PROCEDURE TRAY) IMPLANT
PENCIL BUTTON HOLSTER BLD 10FT (ELECTRODE) IMPLANT
SPONGE LAP 4X18 X RAY DECT (DISPOSABLE) IMPLANT
STRIP CLOSURE SKIN 1/4X3 (GAUZE/BANDAGES/DRESSINGS) IMPLANT
SUT MNCRL AB 3-0 PS2 27 (SUTURE) IMPLANT
SUT PLAIN 0 NONE (SUTURE) IMPLANT
SUT VIC AB 0 CT1 36 (SUTURE) IMPLANT
SYR CONTROL 10ML LL (SYRINGE) IMPLANT
TOWEL OR 17X24 6PK STRL BLUE (TOWEL DISPOSABLE) IMPLANT
TRAY FOLEY CATH 14FR (SET/KITS/TRAYS/PACK) IMPLANT
WATER STERILE IRR 1000ML POUR (IV SOLUTION) IMPLANT

## 2012-03-25 NOTE — OR Nursing (Signed)
Pt states she wants to cancel procedure of Post partum Tubal ligation.  Dr. Stefano Gaul notified and gives verbal order to take pt back to her room 102.  Charge nurse Debbie New RN notified of change.

## 2012-03-25 NOTE — Progress Notes (Signed)
Post Partum Day 1 Subjective: The patient has decided she does not want a tubal sterilization procedure.  Objective: Blood pressure 113/76, pulse 102, temperature 98.2 F (36.8 C), temperature source Oral, resp. rate 20, height 5\' 2"  (1.575 m), weight 280 lb (127.007 kg), last menstrual period 06/24/2011, SpO2 98.00%, unknown if currently breastfeeding.  Physical Exam:  General: no distress Lochia: appropriate Uterine Fundus: firm Incision: NA DVT Evaluation: No evidence of DVT seen on physical exam.   Recent Labs  03/24/12 0806 03/25/12 0515  HGB 10.1* 9.8*  HCT 31.9* 30.8*    Assessment/Plan:  Postpartum day #1 The patient has decided she does not want a tubal sterilization procedure History of a pulmonary embolus Anemia  Contraceptive options reviewed. The patient seems interested in a Mirena IUD. The patient was like to try to go home today. Begin Lovenox today.  Dr. Stefano Gaul   LOS: 2 days   Janine Limbo 03/25/2012, 10:11 AM

## 2012-03-25 NOTE — Anesthesia Postprocedure Evaluation (Signed)
  Anesthesia Post-op Note  Patient: Heather Vargas  Procedure(s) Performed: * No procedures listed *  Patient Location: PACU and Mother/Baby  Anesthesia Type:Epidural  Level of Consciousness: awake, alert  and oriented  Airway and Oxygen Therapy: Patient Spontanous Breathing  Post-op Pain: none  Post-op Assessment: Post-op Vital signs reviewed  Post-op Vital Signs: Reviewed and stable  Complications: No apparent anesthesia complications

## 2012-03-25 NOTE — Progress Notes (Signed)
Called [facility] to verify that SCDs were in place--they were. RN informed CNM that pt was refusing fasting CBG's which were ordered due Hx of Hgb A1C 6.0, but no Dx DM.   [place], CNM 03/25/2012 12:53 AM

## 2012-03-26 ENCOUNTER — Encounter (HOSPITAL_COMMUNITY)
Admission: RE | Admit: 2012-03-26 | Discharge: 2012-03-26 | Disposition: A | Discharge status: Home / Self Care | Payer: BC Managed Care – PPO | Source: Ambulatory Visit | Attending: Obstetrics and Gynecology | Admitting: Obstetrics and Gynecology

## 2012-03-26 MED ORDER — OXYCODONE-ACETAMINOPHEN 5-325 MG PO TABS: 1.0000 | ORAL_TABLET | ORAL | Status: DC | PRN

## 2012-03-26 MED ORDER — ENOXAPARIN SODIUM 100 MG/ML ~~LOC~~ SOLN: 130.0000 mg | Freq: Two times a day (BID) | SUBCUTANEOUS | Status: DC

## 2012-03-26 MED ORDER — IBUPROFEN 600 MG PO TABS: 600.0000 mg | ORAL_TABLET | Freq: Four times a day (QID) | ORAL | Status: DC | PRN

## 2012-03-26 NOTE — Progress Notes (Signed)
Pt inform that she is schedule to have fasting . In the am 03/26/12 Pt declines refuses  Pt . States she doesnt need a fasting  And will not get her blood drawn also states that there is no reason  For a fasting to be drawn.

## 2012-03-26 NOTE — Discharge Summary (Signed)
Physician Discharge Summary  Patient ID: Heather Vargas MRN: [id number] DOB/AGE: 05-Mar-1980 32 y.o.  Admit date: 03/23/2012 Discharge date: 03/26/2012  Admission Diagnoses: Term pregnancy     Chronic hypertension     Morbid obesity     Hx pulmonary embolism     anemia         Discharge Diagnoses:  Principal Problem:   NSVD (normal spontaneous vaginal delivery) Active Problems:   Nuchal cord, delivered, current hospitalization     Chronic hypertension     Morbid obesity     Hx pulmonary embolism     anemia   Discharged Condition: stable  Hospital Course: Pt was admitted for induction of labor and underwent an NSVD without complition.  She changed her mind about postpartum sterilization and plans to have a Mirena IUD inserted at 6 weeks.  She restarted her Lovenox at 130mg  bid per Dr. Milta Deiters recommendation.  On ppd #2, she was deemed to have received maximum hospital benefit and be ready for discharge home  Consults: hematology/oncology during pregnancy  Significant Diagnostic Studies: labs:   Treatments: anticoagulation: LMW heparin  Discharge Exam: Blood pressure 139/83, pulse 88, temperature 97.8 F (36.6 C), temperature source Oral, resp. rate 18, height 5\' 2"  (1.575 m), weight 280 lb (127.007 kg), last menstrual period 06/24/2011, SpO2 98.00%, unknown if currently breastfeeding. General appearance: alert, cooperative, appears stated age, no distress and morbidly obese GI: soft, non-tender; bowel sounds normal; no masses,  no organomegaly and uterus does not seem abnormally enlarged, but exam is compromised by pt habitus. Extremities: extremities normal, atraumatic, no cyanosis or edema and Homans sign is negative, no sign of DVT  Disposition: 01-Home or Self Care   Future Appointments Provider Department Dept Phone   03/30/2012 11:00 AM Victorino December, MD [facility] [phone]       Medication List    STOP taking these medications        B-D TB SYRINGE 1CC/27GX1/2" 27G X 1/2" 1 ML Misc  Generic drug:  TUBERCULIN SYR 1CC/27GX1/2"     terconazole 0.4 % vaginal cream  Commonly known as:  TERAZOL 7     UNABLE TO FIND      TAKE these medications       acetaminophen 500 MG tablet  Commonly known as:  TYLENOL  Take 1,000 mg by mouth every 6 (six) hours as needed. For headache     enoxaparin 100 MG/ML injection  Commonly known as:  LOVENOX  Inject 1.3 mLs (130 mg total) into the skin 2 (two) times daily.     ibuprofen 600 MG tablet  Commonly known as:  ADVIL,MOTRIN  Take 1 tablet (600 mg total) by mouth every 6 (six) hours as needed (for pain).     labetalol 100 MG tablet  Commonly known as:  NORMODYNE  Take 1 tablet (100 mg total) by mouth 2 (two) times daily.     oxyCODONE-acetaminophen 5-325 MG per tablet  Commonly known as:  PERCOCET/ROXICET  Take 1-2 tablets by mouth every 4 (four) hours as needed.     prenatal multivitamin Tabs  Take 1 tablet by mouth daily.           Follow-up Information   Follow up with Drue Second, MD On 03/30/2012. (call for 5 week appt with [facility] to plan IUD placement)    Contact information:   [address] [place] [address] [phone]       Signed: Hal Morales 03/26/2012, 9:00  AM

## 2012-03-27 LAB — TYPE AND SCREEN
Antibody Screen: NEGATIVE
Unit division: 0

## 2012-03-30 ENCOUNTER — Ambulatory Visit: Payer: BC Managed Care – PPO | Admitting: Oncology

## 2012-04-15 ENCOUNTER — Ambulatory Visit (HOSPITAL_BASED_OUTPATIENT_CLINIC_OR_DEPARTMENT_OTHER): Payer: BC Managed Care – PPO | Admitting: Oncology

## 2012-04-15 ENCOUNTER — Telehealth: Payer: Self-pay | Admitting: Oncology

## 2012-04-15 ENCOUNTER — Encounter: Payer: Self-pay | Admitting: Oncology

## 2012-04-15 ENCOUNTER — Encounter: Payer: Self-pay | Admitting: Pharmacist

## 2012-04-15 VITALS — BP 125/78 | HR 82 | Temp 98.2°F | Resp 20 | Ht 62.0 in | Wt 270.0 lb

## 2012-04-15 DIAGNOSIS — O8823 Thromboembolism in the puerperium: Secondary | ICD-10-CM

## 2012-04-15 DIAGNOSIS — I2699 Other pulmonary embolism without acute cor pulmonale: Secondary | ICD-10-CM

## 2012-04-15 DIAGNOSIS — Z86718 Personal history of other venous thrombosis and embolism: Secondary | ICD-10-CM | POA: Insufficient documentation

## 2012-04-15 MED ORDER — WARFARIN SODIUM 5 MG PO TABS: 5.0000 mg | ORAL_TABLET | Freq: Every day | ORAL | Status: DC

## 2012-04-15 NOTE — Progress Notes (Signed)
ID: Heather Vargas  DOB: 1980/04/29  MR#: [id number]  CSN#: [id number]   Interval History:  Postpartum patient is doing well without any problems. She denies any fevers chills night sweats no bleeding. She is taking Lovenox. No Known Allergies     Objective:  Filed Vitals:   04/15/12 1059  BP: 125/78  Pulse: 82  Temp: 98.2 F (36.8 C)  Resp: 20    BMI: Body mass index is 49.37 kg/(m^2).   ECOG FS: 1  Physical Exam:    Lab Results:      Chemistry      Component Value Date/Time   NA 135 03/23/2012 2100   NA 138 02/17/2012 0902   K 3.8 03/23/2012 2100   K 3.5 02/17/2012 0902   CL 100 03/23/2012 2100   CL 109* 02/17/2012 0902   CO2 19 03/23/2012 2100   CO2 22 02/17/2012 0902   BUN 8 03/23/2012 2100   BUN 6.0* 02/17/2012 0902   CREATININE 0.78 03/23/2012 2100   CREATININE 0.67 02/19/2012 1736   CREATININE 0.7 02/17/2012 0902      Component Value Date/Time   CALCIUM 9.8 03/23/2012 2100   CALCIUM 9.3 02/17/2012 0902   ALKPHOS 159* 03/23/2012 2100   ALKPHOS 113 02/17/2012 0902   AST 15 03/23/2012 2100   AST 10 02/17/2012 0902   ALT 7 03/23/2012 2100   ALT <6 Repeated and Verified 02/17/2012 0902   BILITOT 0.4 03/23/2012 2100   BILITOT 0.36 02/17/2012 0902       Lab Results  Component Value Date   WBC 7.5 03/25/2012   HGB 9.8* 03/25/2012   HCT 30.8* 03/25/2012   MCV 85.6 03/25/2012   PLT 194 03/25/2012   NEUTROABS 4.1 03/03/2012    Studies/Results:   Assessment: 32 year old female with   #1History of pulmonary embolism during her recent pregnancy. She has now safely delivered a healthy bouncing baby boy. At this time recommendation is for patient to continue Lovenox and begin Coumadin 5 mg daily. Patient is breast-feeding her son. She again asked about xeralto and I recommended her not going on xeralto and she is breast-feeding. There is no real safety data of xeralto in breast-feeding.  #2 I have referred her to the Coumadin clinic. For ongoing monitoring of her INRs.  Plan:   1.switch to Coumadin with Lovenox bridge. I have recommended patient be on anticoagulation for at least 6 months or even longer.  #2 referred to Coumadin clinic.  #3 I will plan on seeing her back in 3 months.  The length of time of the face-to-face encounter was  30    minutes. More than 50% of time was spent counseling and coordination of care.     Heather Second, MD Medical/Oncology [facility] [phone] (beeper) [phone] (Office)     [name], Konrad Dolores 04/15/2012

## 2012-04-15 NOTE — Telephone Encounter (Signed)
, °

## 2012-04-15 NOTE — Patient Instructions (Addendum)
Proceed with coumadin 5 mg daily along with lovenox bridge.  I have referred you to the coumadin clinic for ongoing monitoring of you your coumadin level (INR)  I will plan on seeing you back in 3 months  Warfarin tablets What is this medicine? WARFARIN (WAR far in) is an anticoagulant. It is used to treat or prevent clots in the veins, arteries, lungs, or heart. This medicine may be used for other purposes; ask your health care provider or pharmacist if you have questions. What should I tell my health care provider before I take this medicine? They need to know if you have any of these conditions: -alcoholism -anemia -bleeding disorders -cancer -diabetes -heart disease -high blood pressure -history of bleeding in the gastrointestinal tract -history of stroke or other brain injury or disease -kidney or liver disease -protein C deficiency -protein S deficiency -psychosis or dementia -recent injury, recent or planned surgery or procedure -an unusual or allergic reaction to warfarin, other medicines, foods, dyes, or preservatives -pregnant or trying to get pregnant -breast-feeding How should I use this medicine? Take this medicine by mouth with a glass of water. Follow the directions on the prescription label. You can take this medicine with or without food. Take your medicine at the same time each day. Do not take it more often than directed. Do not stop taking except on the advice of your doctor or health care professional. A special MedGuide will be given to you by the pharmacist with each prescription and refill. Be sure to read this information carefully each time. Talk to your pediatrician regarding the use of this medicine in children. Special care may be needed. Overdosage: If you think you have taken too much of this medicine contact a poison control center or emergency room at once. NOTE: This medicine is only for you. Do not share this medicine with others. What if I miss a  dose? It is important not to miss a dose. If you miss a dose, call your healthcare provider. Take the dose as soon as possible on the same day. If it is almost time for your next dose, take only that dose. Do not take double or extra doses to make up for a missed dose. What may interact with this medicine? Do not take this medicine with any of the following medications: -agents that prevent or dissolve blood clots -aspirin or other salicylates -danshen -dextrothyroxine -mifepristone -St. John's Wort -red yeast rice This medicine may also interact with the following medications: -acetaminophen -agents that lower cholesterol -alcohol -allopurinol -amiodarone -antibiotics or medicines for treating bacterial, fungal or viral infections -azathioprine -barbiturate medicines for inducing sleep or treating seizures -certain medicines for diabetes -certain medicines for heart rhythm problems -certain medicines for high blood pressure -chloral hydrate -cisapride -disulfiram -female hormones, including contraceptive or birth control pills -general anesthetics -herbal or dietary products like cranberry, garlic, ginkgo, ginseng, Jaquitta Dupriest tea, or kava kava -influenza virus vaccine -female hormones -medicines for mental depression or psychosis -medicines for some types of cancer -medicines for stomach problems -methylphenidate -NSAIDs, medicines for pain and inflammation, like ibuprofen or naproxen -propoxyphene -quinidine, quinine -raloxifene -seizure or epilepsy medicine like carbamazepine, phenytoin, and valproic acid -steroids like cortisone and prednisone -tamoxifen -thyroid medicine -tramadol -vitamin c, vitamin e, and vitamin K -zafirlukast -zileuton This list may not describe all possible interactions. Give your health care provider a list of all the medicines, herbs, non-prescription drugs, or dietary supplements you use. Also tell them if you smoke, drink alcohol, or  use illegal  drugs. Some items may interact with your medicine. What should I watch for while using this medicine? Visit your doctor or health care professional for regular checks on your progress. You will need to have your blood checked regularly to make sure you are getting the right dose of this medicine. When you first start taking this medicine, these tests are done often. Once the correct dose is determined and you take your medicine properly, these tests can be done less often. While you are taking this medicine, carry an identification card with your name, the name and dose of medicine(s) being used, and the name and phone number of your doctor or health care professional or person to contact in an emergency. Do not start taking or stop taking any medicines or over-the-counter medicines except on the advice of your doctor or health care professional. You should discuss your diet with your doctor or health care professional. Do not make major changes in your diet. Many foods contain high amounts of vitamin K, which can interfere with the effect of this medicine. Your doctor or health care professional may want you to limit your intake of foods that contain vitamin K. Some foods that have moderate to high amounts of vitamin K are Xolani Degracia leafy vegetables like beet greens, collard greens, endive, kale, mustard greens, spinach, turnip greens, watercress, and certain lettuces like Alyric Parkin leaf or romaine. Some other foods that have high to moderate amounts of vitamin K are asparagus, black eye peas, broccoli, brussels sprouts, cabbage, cucumber with peel, okra, peas, parsley, and Prisma Decarlo onions. This medicine can cause birth defects or bleeding in an unborn child. Women of childbearing age should use effective birth control while taking this medicine. If a woman becomes pregnant while taking this medicine, she should discuss the potential risks and her options with her health care professional. Avoid sports and activities  that might cause injury while you are using this medicine. Severe falls or injuries can cause unseen bleeding. Be careful when using sharp tools or knives. Consider using an Neurosurgeon. Take special care brushing or flossing your teeth. Report any injuries, bruising, or red spots on the skin to your doctor or health care professional. If you have an illness that causes vomiting, diarrhea, or fever for more than a few days, contact your doctor. Also check with your doctor if you are unable to eat for several days. These problems can change the effect of this medicine. Even after you stop taking this medicine, it takes several days before your body recovers its normal ability to clot blood. Ask your doctor or health care professional how long you need to be careful. If you are going to have surgery or dental work, tell your doctor or health care professional that you have been taking this medicine. What side effects may I notice from receiving this medicine? Side effects that you should report to your doctor or health care professional as soon as possible: -back or stomach pain -chest pain or fast or irregular heartbeat -difficulty breathing or talking, wheezing -dizziness -fever or chills -headaches -heavy menstrual bleeding or vaginal bleeding -nausea, vomiting -painful, blue, or purple toes -painful, prolonged erection -signs and symptoms of bleeding such as bloody or black, tarry stools, red or dark-brown urine, spitting up blood or brown material that looks like coffee grounds, red spots on the skin, unusual bruising or bleeding from the eye, gums, or nose -skin rash, itching or skin damage -unusual swelling or sudden weight gain -  unusually weak or tired -yellowing of skin or eyes Side effects that usually do not require medical attention (report to your doctor or health care professional if they continue or are bothersome): -diarrhea -unusual hair loss This list may not describe all  possible side effects. Call your doctor for medical advice about side effects. You may report side effects to FDA at 1-800-FDA-1088. Where should I keep my medicine? Keep out of the reach of children. Store at room temperature between 15 and 30 degrees C (59 and 86 degrees F). Protect from light. Throw away any unused medicine after the expiration date. NOTE: This sheet is a summary. It may not cover all possible information. If you have questions about this medicine, talk to your doctor, pharmacist, or health care provider.  2013, [employer]vier/[employer] Standard. (12/20/2010 12:08:27 PM)

## 2012-04-15 NOTE — Progress Notes (Signed)
New referral to [facility] Coumadin clinic MD is Welton Flakes Indication for Anticoag: PE @ [redacted] weeks gestation after prolonged travel Duration for Anticoag: 9 months Goal INR = 2-3 Pt is post-partum (baby boy born 03/24/12).  Pt started back on Lovenox 130 mg SQ Q12h on 03/25/12.  Dr. Welton Flakes started pt on Coumadin 5 mg today.  Would like [place] to see pt within 1 week. Plan for pt to come 04/22/12 at 12:45 pm for lab & 1pm for Coumadin clinic. Ebony Hail, Pharm.D., CPP 04/15/2012@1 :08 PM

## 2012-04-22 ENCOUNTER — Ambulatory Visit: Payer: BC Managed Care – PPO

## 2012-04-22 ENCOUNTER — Other Ambulatory Visit: Payer: BC Managed Care – PPO | Admitting: Lab

## 2012-04-22 ENCOUNTER — Telehealth: Payer: Self-pay | Admitting: Pharmacist

## 2012-04-23 ENCOUNTER — Ambulatory Visit (HOSPITAL_BASED_OUTPATIENT_CLINIC_OR_DEPARTMENT_OTHER): Payer: BC Managed Care – PPO | Admitting: Pharmacist

## 2012-04-23 ENCOUNTER — Other Ambulatory Visit: Payer: BC Managed Care – PPO | Admitting: Lab

## 2012-04-23 DIAGNOSIS — Z86718 Personal history of other venous thrombosis and embolism: Secondary | ICD-10-CM

## 2012-04-23 LAB — POCT INR: INR: 1

## 2012-04-23 LAB — PROTIME-INR: INR: 1 — ABNORMAL LOW (ref 2.00–3.50)

## 2012-04-24 ENCOUNTER — Encounter (HOSPITAL_COMMUNITY)
Admission: RE | Admit: 2012-04-24 | Discharge: 2012-04-24 | Disposition: A | Discharge status: Home / Self Care | Payer: BC Managed Care – PPO | Source: Ambulatory Visit | Attending: Obstetrics and Gynecology | Admitting: Obstetrics and Gynecology

## 2012-04-24 DIAGNOSIS — O923 Agalactia: Secondary | ICD-10-CM | POA: Insufficient documentation

## 2012-04-24 NOTE — Patient Instructions (Signed)
Warfarin is not excreted in the breastmilk and ok to use while breastfeeding. Increase coumadin to 7.5mg  daily. Continue Lovenox 130mg   q 12 hours. Recheck INR in 5 days on 04/28/12; lab at 2pm and coumadin clinic at 2:15pm.

## 2012-04-24 NOTE — Progress Notes (Signed)
INR well below goal today after starting coumadin 5mg  daily on 04/18/12. No problems to report regarding anticoagulation. Minor bruising on abdomen from Lovenox. A full discussion of the nature of anticoagulants has been carried out.  The need for frequent and regular monitoring, precise dosage adjustment and compliance is stressed. Side effects of potential bleeding are discussed.  Drug-drug interactions and drug-food interactions have been discussed. We reviewed the importance of consistency with Vitamin K intake.  Warfarin education sheets have been provided to pt. I informed her to avoid great swings in general diet.  Pt is nursing. She does not drink alchohol Pt does not use any herbal supplements. Pt takes her coumadin in the mornings. She took 5mg  today. She will take an additional 1/2 tablet tonight to total 7.5mg  today. Increase coumadin to 7.5mg  daily. Continue Lovenox 130mg   q 12 hours. Recheck INR in 5 days on 04/28/12; lab at 2pm and coumadin clinic at 2:15pm.

## 2012-04-28 ENCOUNTER — Ambulatory Visit (HOSPITAL_BASED_OUTPATIENT_CLINIC_OR_DEPARTMENT_OTHER): Payer: BC Managed Care – PPO | Admitting: Pharmacist

## 2012-04-28 ENCOUNTER — Other Ambulatory Visit (HOSPITAL_BASED_OUTPATIENT_CLINIC_OR_DEPARTMENT_OTHER): Payer: BC Managed Care – PPO | Admitting: Lab

## 2012-04-28 DIAGNOSIS — Z86718 Personal history of other venous thrombosis and embolism: Secondary | ICD-10-CM

## 2012-04-28 LAB — CBC WITH DIFFERENTIAL/PLATELET
BASO%: 0.5 % (ref 0.0–2.0)
EOS%: 2 % (ref 0.0–7.0)
HCT: 35.6 % (ref 34.8–46.6)
LYMPH%: 48.2 % (ref 14.0–49.7)
MCH: 28.2 pg (ref 25.1–34.0)
MCHC: 32.6 g/dL (ref 31.5–36.0)
MCV: 86.4 fL (ref 79.5–101.0)
MONO%: 6.1 % (ref 0.0–14.0)
NEUT%: 43.2 % (ref 38.4–76.8)
Platelets: 238 10*3/uL (ref 145–400)
lymph#: 1.9 10*3/uL (ref 0.9–3.3)

## 2012-04-28 LAB — PROTIME-INR

## 2012-04-28 NOTE — Progress Notes (Addendum)
INR = 1.1 after Coumadin dose increased to 7.5 mg/day. CBC wnl today. Remains on Lovenox 130 mg SQ Q12 hrs until INR = 2-3. Pt c/o blurred vision x 3-4 days.  R sided "nagging" HA.  Not severe.  She uses Ibuprofen PRN w/ some relief.  Some R sided weakness but no paralysis.  She has had no slurred speech, no falls, no facial drooping. Self-detected nodule R breast.  It is bruised.  She saw her MD about this & has [place] scheduled.  Not a clogged milk duct. Overall she feels bad.  She thinks it is the anticoagulation tx.  She never felt this way before being on Coumadin.  She is a little bit emotional today during the visit. She states she is sleeping ok at night; her 7 week old baby is only awake once during the night. INR still below goal.  Increase Coumadin to 10 mg daily.  She will remain on Lovenox.  She has at least 12 syringes at home. Given the CBC is wnl & her other neuro sxs, I have a low suspicion she is having stroke sxs.  However, we reviewed such sxs & if her condition worsens, she will seek emergency care.   Pt is going to have 'E-sure' contraceptive procedure.  Her OB/GYN wants to put her on progesterone OC for the gap & pt wants to know if Dr. Welton Flakes is ok w/ this.  I will have an answer for her when she is here for her repeat INR on Tues 05/05/12 (pt requested this day so that she can have childcare). Ebony Hail, Pharm.D., CPP 04/28/2012@2 :54 PM   Mira, RN for Dr. Welton Flakes will copy off my anticoag note from today & leave on Dr. Milta Deiters desk for review in order to get answer to question re: progesterone. Ebony Hail, Pharm.D., CPP 04/28/2012@3 :04 PM

## 2012-04-30 ENCOUNTER — Other Ambulatory Visit: Payer: Self-pay | Admitting: Obstetrics and Gynecology

## 2012-04-30 ENCOUNTER — Encounter: Payer: Self-pay | Admitting: Pharmacist

## 2012-04-30 DIAGNOSIS — N63 Unspecified lump in unspecified breast: Secondary | ICD-10-CM

## 2012-04-30 DIAGNOSIS — N6310 Unspecified lump in the right breast, unspecified quadrant: Secondary | ICD-10-CM

## 2012-04-30 NOTE — Progress Notes (Signed)
I spoke w/ Hale Drone, RN for Dr. Welton Flakes.  She told me that after leaving anticoag note on Dr. Milta Deiters desk for her assessment, Dr. Welton Flakes prefers for pt NOT to take Progesterone OC. I called [place] w/ this information.  She will discuss w/ her OB/GYN. Pt still having same neuro. sxs as mentioned from Coumadin clinic visit the other day.  No worse.   We will see pt next week on Tues for INR check. Ebony Hail, Pharm.D., CPP 04/30/2012@12 :43 PM

## 2012-05-05 ENCOUNTER — Other Ambulatory Visit: Payer: Self-pay | Admitting: Obstetrics and Gynecology

## 2012-05-05 ENCOUNTER — Ambulatory Visit (HOSPITAL_BASED_OUTPATIENT_CLINIC_OR_DEPARTMENT_OTHER): Payer: BC Managed Care – PPO | Admitting: Pharmacist

## 2012-05-05 ENCOUNTER — Ambulatory Visit
Admission: RE | Admit: 2012-05-05 | Discharge: 2012-05-05 | Disposition: A | Discharge status: Home / Self Care | Payer: BC Managed Care – PPO | Source: Ambulatory Visit | Attending: Obstetrics and Gynecology | Admitting: Obstetrics and Gynecology

## 2012-05-05 ENCOUNTER — Other Ambulatory Visit (HOSPITAL_BASED_OUTPATIENT_CLINIC_OR_DEPARTMENT_OTHER): Payer: BC Managed Care – PPO | Admitting: Lab

## 2012-05-05 DIAGNOSIS — N6310 Unspecified lump in the right breast, unspecified quadrant: Secondary | ICD-10-CM

## 2012-05-05 DIAGNOSIS — Z86718 Personal history of other venous thrombosis and embolism: Secondary | ICD-10-CM

## 2012-05-05 DIAGNOSIS — N63 Unspecified lump in unspecified breast: Secondary | ICD-10-CM

## 2012-05-05 LAB — POCT INR: INR: 1.3

## 2012-05-05 LAB — PROTIME-INR
INR: 1.3 — ABNORMAL LOW (ref 2.00–3.50)
Protime: 15.6 Seconds — ABNORMAL HIGH (ref 10.6–13.4)

## 2012-05-05 MED ORDER — WARFARIN SODIUM 10 MG PO TABS: ORAL_TABLET | ORAL | Status: DC

## 2012-05-05 NOTE — Progress Notes (Signed)
INR = 1.3 on Coumadin 10 mg daily Pt continues on Lovenox 130 mg SQ BID until INR = 2-3 Pt has 2 nodules in R breast.  She had [place] today & there was a hematoma in the larger nodule.  She has f/u bx planned for 3 weeks. No med changes.  No missed doses of Coumadin or Lovenox. Pt is getting discouraged. INR still not responding despite dose increases to Coumadin. I think she will require large doses of Coumadin due to age, race & body weight. She will continue on Lovenox. Increase Coumadin to 15 mg/day except 20 mg Tu/Th/Sat. Pt will contact us if the hematoma expands or there is any bleeding. Return in 1 week for protime. Note: she is considering tubal ligation since she cannot take progesterone per Dr. Milta Deiters request. Ebony Hail, Pharm.D., CPP 05/05/2012@3 :07 PM

## 2012-05-09 LAB — CULTURE, ROUTINE-ABSCESS: Culture: NO GROWTH

## 2012-05-12 ENCOUNTER — Other Ambulatory Visit (HOSPITAL_BASED_OUTPATIENT_CLINIC_OR_DEPARTMENT_OTHER): Payer: BC Managed Care – PPO | Admitting: Lab

## 2012-05-12 ENCOUNTER — Ambulatory Visit: Payer: BC Managed Care – PPO | Admitting: Pharmacist

## 2012-05-12 DIAGNOSIS — Z86718 Personal history of other venous thrombosis and embolism: Secondary | ICD-10-CM

## 2012-05-12 NOTE — Progress Notes (Signed)
INR within goal today. Pt has 2 areas of hematoma (likely) on her right breast. She has follow up for this in 3 weeks. She was instructed to use warm packs and use Ibuprofen. She took one dose of Ibuprofen and she thought her menstrual cycle was trying to return. She stopped the Ibuprofen and the bleeding stopped. No further Ibuprofen. She prefers to use Tylenol, if needed. Only usual, small bruises on arms. No other problems to report related to anticoagulation. Pt will contact us if the hematomas expand, become more painful or if there is any bleeding. No changes in diet, medications, etc. INR increased from 1.3 on 05/05/12 to 3 today. With large increase in INR in 1 week, will decrease coumadin dose. Decrease coumadin to 15 mg daily. Ok to discontinue Lovenox. Recheck INR in 1 week on 05/19/12; lab at 2:30 pm and coumadin clinic at 2:45 pm.

## 2012-05-12 NOTE — Patient Instructions (Signed)
Decrease coumadin to 15 mg daily. Ok to discontinue Lovenox. Recheck INR in 1 week on 05/19/12; lab at 2:30 pm and coumadin clinic at 2:45 pm.

## 2012-05-15 ENCOUNTER — Other Ambulatory Visit: Payer: Self-pay | Admitting: Obstetrics and Gynecology

## 2012-05-18 ENCOUNTER — Other Ambulatory Visit: Payer: Self-pay | Admitting: Obstetrics and Gynecology

## 2012-05-19 ENCOUNTER — Ambulatory Visit (HOSPITAL_BASED_OUTPATIENT_CLINIC_OR_DEPARTMENT_OTHER): Payer: BC Managed Care – PPO | Admitting: Pharmacist

## 2012-05-19 ENCOUNTER — Other Ambulatory Visit: Payer: BC Managed Care – PPO | Admitting: Lab

## 2012-05-19 DIAGNOSIS — Z86718 Personal history of other venous thrombosis and embolism: Secondary | ICD-10-CM

## 2012-05-19 LAB — PROTIME-INR: INR: 2.4 (ref 2.00–3.50)

## 2012-05-19 LAB — POCT INR: INR: 2.4

## 2012-05-19 NOTE — Patient Instructions (Addendum)
Continue Coumadin 15 mg daily. Recheck INR in 2 weeks on 06/02/12; lab at 2:30 pm and coumadin clinic at 2:45 pm.

## 2012-05-19 NOTE — Progress Notes (Signed)
INR within goal today. No problems to report regarding anticoagulation. Two bruised areas on right breast are starting to heal.  The skin color is returning to normal. Hardened areas of hematoma still exist.  They have not changed in size. They have not gotten any larger. Pt has f/u with the breast center on 05/26/12 Pt started Wellbutrin XL 150mg  daily on 05/14/12. This should not effect her INR. Will continue Coumadin 15 mg daily. Recheck INR in 2 weeks on 06/02/12; lab at 2:30 pm and coumadin clinic at 2:45 pm.

## 2012-05-20 ENCOUNTER — Encounter (HOSPITAL_COMMUNITY): Payer: Self-pay | Admitting: Pharmacist

## 2012-05-22 ENCOUNTER — Encounter (HOSPITAL_COMMUNITY): Payer: Self-pay

## 2012-05-22 ENCOUNTER — Encounter (HOSPITAL_COMMUNITY)
Admission: RE | Admit: 2012-05-22 | Discharge: 2012-05-22 | Disposition: A | Discharge status: Home / Self Care | Payer: BC Managed Care – PPO | Source: Ambulatory Visit | Attending: Obstetrics and Gynecology | Admitting: Obstetrics and Gynecology

## 2012-05-22 LAB — BASIC METABOLIC PANEL
CO2: 25 mEq/L (ref 19–32)
Calcium: 9.7 mg/dL (ref 8.4–10.5)
GFR calc non Af Amer: 84 mL/min — ABNORMAL LOW (ref >90)
Potassium: 3.9 mEq/L (ref 3.5–5.1)
Sodium: 134 mEq/L — ABNORMAL LOW (ref 135–145)

## 2012-05-22 LAB — CBC
HCT: 35.7 % — ABNORMAL LOW (ref 36.0–46.0)
MCV: 86.2 fL (ref 78.0–100.0)
RBC: 4.14 MIL/uL (ref 3.87–5.11)
RDW: 13.1 % (ref 11.5–15.5)
WBC: 4.3 10*3/uL (ref 4.0–10.5)

## 2012-05-22 LAB — PROTIME-INR: INR: 1.88 — ABNORMAL HIGH (ref 0.00–1.49)

## 2012-05-22 NOTE — Addendum Note (Signed)
Addended by: Jaymes Graff on: 05/22/2012 12:24 AM   Modules accepted: Orders

## 2012-05-22 NOTE — Patient Instructions (Addendum)
[address] [place] [address] [place]  05/22/2012   Your procedure is scheduled on:  Unknown. Pt states MD office has not set surgery date, MD office closed today (05/22/12) but instructed patient to come for PAT appt. Anyway.  Enter through the [employer] [facility] at   [facility] up the phone at the desk and dial [phone].   Call this number if you have problems the morning of surgery: [phone]   Remember:   Do not eat food:After Midnight.  Do not drink clear liquids: After Midnight.  Take these medicines the morning of surgery with A SIP OF WATER: Labetalol   Do not wear jewelry, make-up or nail polish.  Do not wear lotions, powders, or perfumes. You may wear deodorant.  Do not shave 48 hours prior to surgery.  Do not bring valuables to the hospital.  Contacts, dentures or bridgework may not be worn into surgery.  Leave suitcase in the car. After surgery it may be brought to your room.  For patients admitted to the hospital, checkout time is 11:00 AM the day of discharge.   Patients discharged the day of surgery will not be allowed to drive home.  Name and phone number of your driver: undecided  Special Instructions: Shower using CHG 2 nights before surgery and the night before surgery.  If you shower the day of surgery use CHG.  Use special wash - you have one bottle of CHG for all showers.  You should use approximately 1/3 of the bottle for each shower.   Please read over the following fact sheets that you were given: Surgical Site Infection Prevention

## 2012-05-22 NOTE — Pre-Procedure Instructions (Signed)
Pt. Presents for scheduled PAT appt. Per instructions of MD office (Adrianne) with no surgery date scheduled. Instructed patient in most pre-op instructions, but informed her I would call MD office, and her on Mon. (closed today) and to verify specific instructions.

## 2012-05-22 NOTE — Pre-Procedure Instructions (Signed)
Hx reviewed with Dr Cristela Blue, no orders given.

## 2012-05-25 ENCOUNTER — Encounter (HOSPITAL_COMMUNITY)
Admission: RE | Admit: 2012-05-25 | Discharge: 2012-05-25 | Disposition: A | Discharge status: Home / Self Care | Payer: BC Managed Care – PPO | Source: Ambulatory Visit | Attending: Obstetrics and Gynecology | Admitting: Obstetrics and Gynecology

## 2012-05-25 ENCOUNTER — Encounter: Payer: Self-pay | Admitting: Pharmacist

## 2012-05-25 ENCOUNTER — Telehealth: Payer: Self-pay | Admitting: Pharmacist

## 2012-05-25 DIAGNOSIS — O923 Agalactia: Secondary | ICD-10-CM | POA: Insufficient documentation

## 2012-05-25 NOTE — Telephone Encounter (Signed)
Spoke with pt regarding upcoming procedure on 05/28/12. The procedure will be done vaginally. There will be no cutting. The personnel doing the procedure are aware that she is on coumadin. They are ok with performing the procedure while she is on coumadin. She does not need to stop coumadin for the procedure. This was discussed at her pre-op visit on 05/22/12. Dr. Welton Flakes aware. She is fine with the plan as along as the MD doing the procedure is ok performing the procedure while pt is on coumadin. Spoke with pt regarding the above communications. Pt will continue coumadin 15mg  daily. Will repeat INR as scheduled on 06/02/12.

## 2012-05-26 ENCOUNTER — Ambulatory Visit
Admission: RE | Admit: 2012-05-26 | Discharge: 2012-05-26 | Disposition: A | Discharge status: Home / Self Care | Payer: BC Managed Care – PPO | Source: Ambulatory Visit | Attending: Obstetrics and Gynecology | Admitting: Obstetrics and Gynecology

## 2012-05-26 DIAGNOSIS — N63 Unspecified lump in unspecified breast: Secondary | ICD-10-CM

## 2012-05-26 DIAGNOSIS — N6310 Unspecified lump in the right breast, unspecified quadrant: Secondary | ICD-10-CM

## 2012-05-28 ENCOUNTER — Ambulatory Visit (HOSPITAL_COMMUNITY): Payer: BC Managed Care – PPO | Admitting: Anesthesiology

## 2012-05-28 ENCOUNTER — Encounter (HOSPITAL_COMMUNITY)
Admission: RE | Disposition: A | Discharge status: Home / Self Care | Payer: Self-pay | Source: Ambulatory Visit | Attending: Obstetrics and Gynecology

## 2012-05-28 ENCOUNTER — Encounter (HOSPITAL_COMMUNITY): Payer: Self-pay | Admitting: Anesthesiology

## 2012-05-28 ENCOUNTER — Ambulatory Visit (HOSPITAL_COMMUNITY)
Admission: RE | Admit: 2012-05-28 | Discharge: 2012-05-28 | Disposition: A | Discharge status: Home / Self Care | Payer: BC Managed Care – PPO | Source: Ambulatory Visit | Attending: Obstetrics and Gynecology | Admitting: Obstetrics and Gynecology

## 2012-05-28 DIAGNOSIS — N840 Polyp of corpus uteri: Secondary | ICD-10-CM | POA: Insufficient documentation

## 2012-05-28 DIAGNOSIS — Z302 Encounter for sterilization: Secondary | ICD-10-CM | POA: Insufficient documentation

## 2012-05-28 DIAGNOSIS — Z86711 Personal history of pulmonary embolism: Secondary | ICD-10-CM | POA: Insufficient documentation

## 2012-05-28 HISTORY — PX: HYSTEROSCOPY: SHX211

## 2012-05-28 HISTORY — PX: TUBAL LIGATION: SHX77

## 2012-05-28 LAB — HEMOGLOBIN AND HEMATOCRIT, BLOOD: Hemoglobin: 12 g/dL (ref 12.0–15.0)

## 2012-05-28 SURGERY — ESSURE TUBAL STERILIZATION
Anesthesia: General | Site: Vagina | Wound class: Clean Contaminated

## 2012-05-28 MED ORDER — DEXAMETHASONE SODIUM PHOSPHATE 4 MG/ML IJ SOLN
INTRAMUSCULAR | Status: DC | PRN
  Administered 2012-05-28: 10 mg via INTRAVENOUS

## 2012-05-28 MED ORDER — ONDANSETRON HCL 4 MG/2ML IJ SOLN
INTRAMUSCULAR | Status: AC
  Filled 2012-05-28: qty 2

## 2012-05-28 MED ORDER — FENTANYL CITRATE 0.05 MG/ML IJ SOLN
INTRAMUSCULAR | Status: DC | PRN
  Administered 2012-05-28: 100 ug via INTRAVENOUS

## 2012-05-28 MED ORDER — DEXAMETHASONE SODIUM PHOSPHATE 10 MG/ML IJ SOLN
INTRAMUSCULAR | Status: AC
  Filled 2012-05-28: qty 1

## 2012-05-28 MED ORDER — PROPOFOL 10 MG/ML IV EMUL
INTRAVENOUS | Status: AC
  Filled 2012-05-28: qty 20

## 2012-05-28 MED ORDER — FENTANYL CITRATE 0.05 MG/ML IJ SOLN
INTRAMUSCULAR | Status: AC
  Filled 2012-05-28: qty 2

## 2012-05-28 MED ORDER — LIDOCAINE HCL (CARDIAC) 20 MG/ML IV SOLN
INTRAVENOUS | Status: DC | PRN
  Administered 2012-05-28: 80 mg via INTRAVENOUS

## 2012-05-28 MED ORDER — ACETAMINOPHEN 10 MG/ML IV SOLN
INTRAVENOUS | Status: DC | PRN
  Administered 2012-05-28: 1000 mg via INTRAVENOUS

## 2012-05-28 MED ORDER — DOXYCYCLINE HYCLATE 50 MG PO CAPS: 100.0000 mg | ORAL_CAPSULE | Freq: Two times a day (BID) | ORAL | Status: AC

## 2012-05-28 MED ORDER — PROPOFOL 10 MG/ML IV EMUL
INTRAVENOUS | Status: DC | PRN
  Administered 2012-05-28: 270 mg via INTRAVENOUS

## 2012-05-28 MED ORDER — LIDOCAINE HCL 2 % IJ SOLN
INTRAMUSCULAR | Status: AC
  Filled 2012-05-28: qty 20

## 2012-05-28 MED ORDER — MIDAZOLAM HCL 5 MG/5ML IJ SOLN
INTRAMUSCULAR | Status: DC | PRN
  Administered 2012-05-28: 2 mg via INTRAVENOUS

## 2012-05-28 MED ORDER — FENTANYL CITRATE 0.05 MG/ML IJ SOLN: INTRAMUSCULAR | Status: DC | PRN

## 2012-05-28 MED ORDER — FENTANYL CITRATE 0.05 MG/ML IJ SOLN
INTRAMUSCULAR | Status: AC
  Administered 2012-05-28: 50 ug via INTRAVENOUS
  Filled 2012-05-28: qty 2

## 2012-05-28 MED ORDER — FENTANYL CITRATE 0.05 MG/ML IJ SOLN: 25.0000 ug | INTRAMUSCULAR | Status: DC | PRN

## 2012-05-28 MED ORDER — MIDAZOLAM HCL 2 MG/2ML IJ SOLN
INTRAMUSCULAR | Status: AC
  Filled 2012-05-28: qty 2

## 2012-05-28 MED ORDER — LIDOCAINE HCL 2 % IJ SOLN
INTRAMUSCULAR | Status: DC | PRN
  Administered 2012-05-28: 20 mL

## 2012-05-28 MED ORDER — ONDANSETRON HCL 4 MG/2ML IJ SOLN
INTRAMUSCULAR | Status: DC | PRN
  Administered 2012-05-28: 4 mg via INTRAVENOUS

## 2012-05-28 MED ORDER — HYDROCODONE-ACETAMINOPHEN 5-325 MG PO TABS: 1.0000 | ORAL_TABLET | Freq: Four times a day (QID) | ORAL | Status: DC | PRN

## 2012-05-28 MED ORDER — MIDAZOLAM HCL 5 MG/5ML IJ SOLN: INTRAMUSCULAR | Status: DC | PRN

## 2012-05-28 MED ORDER — LACTATED RINGERS IV SOLN
INTRAVENOUS | Status: DC
  Administered 2012-05-28 (×2): via INTRAVENOUS

## 2012-05-28 MED ORDER — LIDOCAINE HCL (CARDIAC) 20 MG/ML IV SOLN
INTRAVENOUS | Status: AC
  Filled 2012-05-28: qty 5

## 2012-05-28 SURGICAL SUPPLY — 17 items
CANISTER SUCTION 2500CC (MISCELLANEOUS) ×3 IMPLANT
CATH ROBINSON RED A/P 16FR (CATHETERS) ×3 IMPLANT
CLOTH BEACON ORANGE TIMEOUT ST (SAFETY) ×3 IMPLANT
CONTAINER PREFILL 10% NBF 60ML (FORM) ×3 IMPLANT
DRESSING TELFA 8X3 (GAUZE/BANDAGES/DRESSINGS) ×3 IMPLANT
ELECT REM PT RETURN 9FT ADLT (ELECTROSURGICAL)
ELECTRODE REM PT RTRN 9FT ADLT (ELECTROSURGICAL) IMPLANT
GLOVE BIO SURGEON STRL SZ 6.5 (GLOVE) ×3 IMPLANT
GLOVE BIOGEL PI IND STRL 7.0 (GLOVE) ×2 IMPLANT
GLOVE BIOGEL PI INDICATOR 7.0 (GLOVE) ×1
GOWN STRL REIN XL XLG (GOWN DISPOSABLE) ×6 IMPLANT
KIT ESSURE FALLOPIAN CLOSURE (Ring) ×3 IMPLANT
PACK HYSTEROSCOPY LF (CUSTOM PROCEDURE TRAY) ×3 IMPLANT
PAD OB MATERNITY 4.3X12.25 (PERSONAL CARE ITEMS) ×3 IMPLANT
SYR 20CC LL (SYRINGE) ×3 IMPLANT
TOWEL OR 17X24 6PK STRL BLUE (TOWEL DISPOSABLE) ×6 IMPLANT
WATER STERILE IRR 1000ML POUR (IV SOLUTION) ×3 IMPLANT

## 2012-05-28 NOTE — Anesthesia Preprocedure Evaluation (Addendum)
Anesthesia Evaluation  Patient identified by MRN, date of birth, ID band Patient awake    Reviewed: Allergy & Precautions, H&P , Patient's Chart, lab work & pertinent test results, reviewed documented beta blocker date and time   Airway Mallampati: III TM Distance: >3 FB Neck ROM: full    Dental no notable dental hx.    Pulmonary  breath sounds clear to auscultation  Pulmonary exam normal       Cardiovascular hypertension, On Home Beta Blockers Rhythm:regular Rate:Normal     Neuro/Psych    GI/Hepatic   Endo/Other  Morbid obesity  Renal/GU      Musculoskeletal   Abdominal   Peds  Hematology   Anesthesia Other Findings   Reproductive/Obstetrics                           Anesthesia Physical Anesthesia Plan  ASA: III  Anesthesia Plan: General   Post-op Pain Management:    Induction: Intravenous  Airway Management Planned: LMA  Additional Equipment:   Intra-op Plan:   Post-operative Plan:   Informed Consent: I have reviewed the patients History and Physical, chart, labs and discussed the procedure including the risks, benefits and alternatives for the proposed anesthesia with the patient or authorized representative who has indicated his/her understanding and acceptance.   Dental Advisory Given  Plan Discussed with: CRNA and Surgeon  Anesthesia Plan Comments: (  Discussed  general anesthesia, including possible nausea, instrumentation of airway, sore throat,pulmonary aspiration, etc. I asked if the were any outstanding questions, or  concerns before we proceeded. )       Anesthesia Quick Evaluation

## 2012-05-28 NOTE — Anesthesia Postprocedure Evaluation (Signed)
  Anesthesia Post-op Note  Patient: Heather Vargas  Procedure(s) Performed: Procedure(s): ESSURE TUBAL STERILIZATION (Bilateral) HYSTEROSCOPY (N/A) Patient is awake and responsive. Pain and nausea are reasonably well controlled. Vital signs are stable and clinically acceptable. Oxygen saturation is clinically acceptable. There are no apparent anesthetic complications at this time. Patient is ready for discharge.

## 2012-05-28 NOTE — Anesthesia Procedure Notes (Signed)
Procedure Name: LMA Insertion Date/Time: 05/28/2012 10:19 AM Performed by: Elbert Ewings Pre-anesthesia Checklist: Emergency Drugs available, Timeout performed, Suction available, Patient being monitored and Patient identified Patient Re-evaluated:Patient Re-evaluated prior to inductionOxygen Delivery Method: Circle system utilized Preoxygenation: Pre-oxygenation with 100% oxygen Intubation Type: IV induction Ventilation: Mask ventilation without difficulty LMA: LMA with gastric port inserted LMA Size: 4.0 Number of attempts: 1 Airway Equipment and Method: Patient positioned with wedge pillow Placement Confirmation: breath sounds checked- equal and bilateral and positive ETCO2 Tube secured with: Tape Dental Injury: Teeth and Oropharynx as per pre-operative assessment

## 2012-05-28 NOTE — Progress Notes (Signed)
Time of teaching should be recorded at 1415.  A. Otelia Hettinger, RN

## 2012-05-28 NOTE — Transfer of Care (Signed)
Immediate Anesthesia Transfer of Care Note  Patient: Heather Vargas  Procedure(s) Performed: Procedure(s): ESSURE TUBAL STERILIZATION (Bilateral) HYSTEROSCOPY (N/A)  Patient Location: PACU  Anesthesia Type:General  Level of Consciousness: awake, alert  and oriented  Airway & Oxygen Therapy: Patient Spontanous Breathing and Patient connected to nasal cannula oxygen  Post-op Assessment: Report given to PACU RN and Post -op Vital signs reviewed and stable  Post vital signs: Reviewed and stable  Complications: No apparent anesthesia complications

## 2012-05-28 NOTE — Op Note (Signed)
Pre op DX: Desires Sterilization; CPT [phone]  Pt desires Essure.  H/O PE on coumadin  Post Op WJ:XBJY s/p Essure   PHYSICIAN : Oluwaseun Cremer   ASSISTANTS: none   ANESTHESIA:   general and paracervical block  ESTIMATED BLOOD LOSS: minimal  LOCAL MEDICATIONS USED:  LIDOCAINE 20CC  SPECIMEN:  Source of Specimen:  Endometrial polyp bx  DISPOSITION OF SPECIMEN:  PATHOLOGY  COUNTS Correct:  YES    DICTATION #: The patient was taken to the operating room and prepped and draped in a normal sterile fashion. An in out catheter was used to drain the bladder.   A bivalve speculum was placed into the vagina and anterior lip of the cervix was grasped with a single-tooth tenaculum.  20 cc of 2% lidocaine was used for cervical block.   The hysteroscope was placed into the uterine cavity.both ostia were visualized. Is to have a small polyp and and and aspect of the uterus. Sure device was placed into the patient's left  ostium without difficulty.  Was past fallopian tube into the black marker was seen. The knob  was rolled back until it could be pulled back any further.  The device was then pullled back until we saw the gold marker.  The button on the knob was pushed and the rolled knob was pulled back until it could not be done any further.  Same procedure was repeated and the patient right ostium without difficulty.  Three coils were seen on both sides.  The device was removed and polyp forceps were used to remove the polyp.  All instruments were removed from the uterus and vagina.  Sponge and instrument count was correct.  The tenaculum site was made hemostatic with pressure and silver nitrate.   PLAN OF CARE: discharge to home  PATIENT DISPOSITION:  PACU - hemodynamically stable. Will check an h/h in 3 hours before discharge.

## 2012-05-28 NOTE — Discharge Instructions (Addendum)
Transcervical Hysteroscopic Sterilization Transcervical hysteroscopic sterilizationis a procedure performed to permanently prevent pregnancy. Transcervical means the procedure is done though the cervix, so no cut (incision) is needed. Tiny coils (microinserts) are placed in the fallopian tubes. After the microinserts are placed, scar tissue forms in the fallopian tubes. The scar tissue will not allow an egg to reach the uterus. If an egg cannot reach the uterus, sperm cannot fertilize it.  You must be very sure you do not want to get pregnant. Deciding to have a permanent sterilization is a big decision. Take your time and do not decide under stress. Women who make this decision before age 40 also tend to have later regrets. Talk about the procedure with your partner. Things to know:  This procedure is not considered effective birth control for at least 3 months.  You will need an additional procedure (hysterosalpingiogram, HSG) to confirm the tubes are blocked.  You will need to use another form of birth control for at least 3 months.  If your tubes are not blocked after 3 months, you will need to talk with your caregiver about options. LET YOUR CAREGIVER KNOW ABOUT:  Gynecological history, including previous gynecological surgeries or procedures, recent pregnancies, and previous birth control use, including pills and intrauterine devices (IUDs).  Allergies. This includes any problems or reactions to metals.  All medicines you are taking including vitamins, herbs, over-the-counter medicines, and creams.  Use of steroids (by mouth or as creams).  Previous problems with numbing medicines.  Previous bleeding problems.  Previous surgeries.  Other health problems, including diabetes and kidney problems.  Possibility of pregnancy.  Recent pelvic infections. RISKS AND COMPLICATIONS  A hole (perforation) in the uterus or fallopian tube.  Allergic reaction to the coils used to  block the fallopian tubes.  The coil falls out (extrusion).  Infection.  Bleeding.  Chronic or acute pelvicpain.  Painful menstrual periods.  A pregnancy that grows inside a fallopian tube instead of the uterus (ectopic pregnancy).  One or both fallopian tubes are not fully blocked. BEFORE THE PROCEDURE  You may need to have a pregnancy test.  Ask your caregiver about changing or stopping your regular medicines.  You may need to keep track of your menstrual cycle. This procedure works best when it is done about 7 days after your period starts.  Talk to your caregiver about birth control.  If you are not using birth control, you may need to start 2 to 3 weeks before the procedure. This makes the procedure easier and ensures that you are not pregnant. PROCEDURE This procedure takes about 30 minutes. It may be done in your caregiver's office or in an outpatient clinic.   You will be awake during the procedure.  You may be given a medicine to numb your cervix (local anesthetic).  A long, thin telescope with a camera (hysteroscope) will be put into your vagina, then through your cervix, and into the uterus.  The openings to both fallopian tubes are seen with the hysteroscope.  Through the hysteroscope, microinserts are put into the fallopian tube openings. They unwind once they are in place. They do not block the openings to the fallopian tubes, but over time, the microinserts will scar the tubes shut. AFTER THE PROCEDURE NO IBUPROFEN CONTAINING PRODUCTS (i.e. Advil, Aleve, Motrin, etc) UNTIL AFTER 5:45 pm this evening.  DISCHARGE INSTRUCTIONS: Laparoscopy  The following instructions have been prepared to help you care for yourself upon your return home today.  Wound  care:  Do not get the incision wet for the first 24 hours. The incision should be kept clean and dry.  The Band-Aids or dressings may be removed the day after surgery.  Should the incision become sore, red,  and swollen after the first week, check with your doctor.  Personal hygiene:  Shower the day after your procedure.  Activity and limitations:  Do NOT drive or operate any equipment today.  Do NOT lift anything more than 15 pounds for 2-3 weeks after surgery.  Do NOT rest in bed all day.  Walking is encouraged. Walk each day, starting slowly with 5-minute walks 3 or 4 times a day. Slowly increase the length of your walks.  Walk up and down stairs slowly.  Do NOT do strenuous activities, such as golfing, playing tennis, bowling, running, biking, weight lifting, gardening, mowing, or vacuuming for 2-4 weeks. Ask your doctor when it is okay to start.  Diet: Eat a light meal as desired this evening. You may resume your usual diet tomorrow.  What to expect after your surgery: You may have a slight burning sensation when you urinate on the first day. You may have a very small amount of blood in the urine. Expect to have a small amount of vaginal discharge/light bleeding for 1-2 weeks. It is not unusual to have abdominal soreness and bruising for up to 2 weeks. You may be tired and need more rest for about 1 week. You may experience shoulder pain for 24-72 hours. Lying flat in bed may relieve it.  Call your doctor for any of the following:  Develop a fever of 100.4 or greater  Inability to urinate 6 hours after discharge from hospital  Severe pain not relieved by pain medications  Persistent of heavy bleeding at incision site  Redness or swelling around incision site after a week  Increasing nausea or vomiting  Patient Signature________________________________________ Nurse Signature_________________________________________    Document Released: 10/09/2010 Document Revised: 04/15/2011 Document Reviewed: 10/09/2010 [facility] Patient Information 2013 [place], [place].
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
## 2012-05-30 ENCOUNTER — Encounter (HOSPITAL_COMMUNITY): Payer: Self-pay | Admitting: Obstetrics and Gynecology

## 2012-06-02 ENCOUNTER — Ambulatory Visit (HOSPITAL_BASED_OUTPATIENT_CLINIC_OR_DEPARTMENT_OTHER): Payer: BC Managed Care – PPO | Admitting: Pharmacist

## 2012-06-02 ENCOUNTER — Other Ambulatory Visit (HOSPITAL_BASED_OUTPATIENT_CLINIC_OR_DEPARTMENT_OTHER): Payer: BC Managed Care – PPO | Admitting: Lab

## 2012-06-02 DIAGNOSIS — Z86718 Personal history of other venous thrombosis and embolism: Secondary | ICD-10-CM

## 2012-06-02 LAB — POCT INR: INR: 2.2

## 2012-06-02 LAB — PROTIME-INR: Protime: 26.4 Seconds — ABNORMAL HIGH (ref 10.6–13.4)

## 2012-06-02 NOTE — Progress Notes (Signed)
INR continues to be a goal on coumadin 15mg  daily.  She is on Doxycycline day 5/7.   Heather Vargas c/o of a heavy menstrual cycle that started on 05/30/12, which is not normal for her.  This is her 1st menstrual cycle since having baby.  She also had Essure tubal sterilization done on 05/28/12.  I told pt to call ob/gyn to see if this is normal after procedure since INR at lower end of goal.  Will continue coumadin 15mg  daily and check PT/INR in 1 month.  Heather Vargas knows to call if heavy menstrual cycle continues or if she needs anything else pertaining to coumadin therapy.

## 2012-06-25 ENCOUNTER — Encounter (HOSPITAL_COMMUNITY)
Admission: RE | Admit: 2012-06-25 | Discharge: 2012-06-25 | Disposition: A | Discharge status: Home / Self Care | Payer: BC Managed Care – PPO | Source: Ambulatory Visit | Attending: Obstetrics and Gynecology | Admitting: Obstetrics and Gynecology

## 2012-06-25 DIAGNOSIS — O923 Agalactia: Secondary | ICD-10-CM | POA: Insufficient documentation

## 2012-06-30 ENCOUNTER — Other Ambulatory Visit (HOSPITAL_BASED_OUTPATIENT_CLINIC_OR_DEPARTMENT_OTHER): Payer: BC Managed Care – PPO | Admitting: Lab

## 2012-06-30 ENCOUNTER — Ambulatory Visit: Payer: BC Managed Care – PPO | Admitting: Pharmacist

## 2012-06-30 DIAGNOSIS — Z5181 Encounter for therapeutic drug level monitoring: Secondary | ICD-10-CM

## 2012-06-30 DIAGNOSIS — Z7901 Long term (current) use of anticoagulants: Secondary | ICD-10-CM

## 2012-06-30 DIAGNOSIS — Z86718 Personal history of other venous thrombosis and embolism: Secondary | ICD-10-CM

## 2012-06-30 LAB — PROTIME-INR
INR: 1.6 — ABNORMAL LOW (ref 2.00–3.50)
Protime: 19.2 Seconds — ABNORMAL HIGH (ref 10.6–13.4)

## 2012-06-30 LAB — POCT INR: INR: 1.6

## 2012-06-30 NOTE — Patient Instructions (Signed)
INR below goal. Likely due to missing 1 dose and diet changes (more vit K rich foods) over weekend due to holiday activities   Take 20 mg today x 1  Then continue 15 mg daily   Return in 3 weeks with other appointments. Lab 11:30, coumadin clinic 11:45, md appointment at 12 noon

## 2012-06-30 NOTE — Progress Notes (Signed)
INR below goal. Likely due to missing 1 dose and diet changes (more vit K rich foods) over weekend due to holiday activities. Pt reports no bruising or bleeding and no arm or leg pain or swelling. Plan is to have patient Take 20 mg today x 1 Then continue 15 mg daily and Return in 3 weeks with other appointments. Lab 11:30, coumadin clinic 11:45, md appointment at 12 noon. Pt instructed to call if she has any concerns or if symptoms arise

## 2012-07-16 ENCOUNTER — Other Ambulatory Visit: Payer: Self-pay | Admitting: Obstetrics and Gynecology

## 2012-07-16 DIAGNOSIS — Z308 Encounter for other contraceptive management: Secondary | ICD-10-CM

## 2012-07-20 ENCOUNTER — Encounter: Payer: Self-pay | Admitting: Oncology

## 2012-07-20 ENCOUNTER — Ambulatory Visit (HOSPITAL_BASED_OUTPATIENT_CLINIC_OR_DEPARTMENT_OTHER): Payer: BC Managed Care – PPO | Admitting: Oncology

## 2012-07-20 ENCOUNTER — Telehealth: Payer: Self-pay | Admitting: *Deleted

## 2012-07-20 ENCOUNTER — Other Ambulatory Visit (HOSPITAL_BASED_OUTPATIENT_CLINIC_OR_DEPARTMENT_OTHER): Payer: BC Managed Care – PPO

## 2012-07-20 ENCOUNTER — Ambulatory Visit (HOSPITAL_BASED_OUTPATIENT_CLINIC_OR_DEPARTMENT_OTHER): Payer: BC Managed Care – PPO | Admitting: Pharmacist

## 2012-07-20 VITALS — BP 119/80 | HR 73 | Temp 98.4°F | Resp 20 | Ht 62.0 in | Wt 272.8 lb

## 2012-07-20 DIAGNOSIS — Z86718 Personal history of other venous thrombosis and embolism: Secondary | ICD-10-CM

## 2012-07-20 DIAGNOSIS — Z7901 Long term (current) use of anticoagulants: Secondary | ICD-10-CM

## 2012-07-20 DIAGNOSIS — I2699 Other pulmonary embolism without acute cor pulmonale: Secondary | ICD-10-CM

## 2012-07-20 LAB — PROTIME-INR: Protime: 30 Seconds — ABNORMAL HIGH (ref 10.6–13.4)

## 2012-07-20 NOTE — Progress Notes (Signed)
INR at goal today. No issues or missed doses. No bleeding to report but patient has some mild bruising that heals properly. Pt to see Dr. Welton Flakes today. No changes to regimen. Patient will continue 15 mg daily. Return in 1 month on 08/17/12 at 11am for lab and 1115 for coumadin clinic

## 2012-07-20 NOTE — Patient Instructions (Addendum)
INR at goal. Randie Heinz Job!  No Issues  Continue Coumadin 15 mg daily.  Recheck INR in 3 weeks on 08/17/12; lab at 11:00am and coumadin clinic at 11:15 am.

## 2012-07-20 NOTE — Patient Instructions (Addendum)
We discussed continuing coumadin for another 6 months  I have ordered a repeat CT angiogram to follow up on your previous Blood clot  I will see you back after the scans in November

## 2012-07-20 NOTE — Telephone Encounter (Signed)
appts made and printed. Pt is aware that cs will call her with her appt d/t for het CT angiogram...td

## 2012-07-20 NOTE — Progress Notes (Signed)
ID: Heather Vargas  DOB: 1980/08/30  MR#: [id number]  CSN#: [id number]   Interval History:  Postpartum patient is doing well without any problems. She denies any fevers chills night sweats no bleeding. She is taking Lovenox. No Known Allergies     Objective:  Filed Vitals:   07/20/12 1220  BP: 119/80  Pulse: 73  Temp: 98.4 F (36.9 C)  Resp: 20    BMI: Body mass index is 49.88 kg/(m^2).   ECOG FS: 1  Physical Exam: Patient is awake alert in no acute distress well-developed well-nourished female Lungs clear to auscultation Cardiovascular regular rate rhythm Abdomen soft nontender nondistended bowel sounds are present Extremities no edema   Lab Results:      Chemistry      Component Value Date/Time   NA 134* 05/22/2012 0900   NA 138 02/17/2012 0902   K 3.9 05/22/2012 0900   K 3.5 02/17/2012 0902   CL 101 05/22/2012 0900   CL 109* 02/17/2012 0902   CO2 25 05/22/2012 0900   CO2 22 02/17/2012 0902   BUN 12 05/22/2012 0900   BUN 6.0* 02/17/2012 0902   CREATININE 0.91 05/22/2012 0900   CREATININE 0.67 02/19/2012 1736   CREATININE 0.7 02/17/2012 0902      Component Value Date/Time   CALCIUM 9.7 05/22/2012 0900   CALCIUM 9.3 02/17/2012 0902   ALKPHOS 159* 03/23/2012 2100   ALKPHOS 113 02/17/2012 0902   AST 15 03/23/2012 2100   AST 10 02/17/2012 0902   ALT 7 03/23/2012 2100   ALT <6 Repeated and Verified 02/17/2012 0902   BILITOT 0.4 03/23/2012 2100   BILITOT 0.36 02/17/2012 0902       Lab Results  Component Value Date   WBC 4.3 05/22/2012   HGB 12.0 05/28/2012   HCT 36.4 05/28/2012   MCV 86.2 05/22/2012   PLT 266 05/22/2012   NEUTROABS 1.7 04/28/2012    Studies/Results:   Assessment: 32 year old female with   #1History of pulmonary embolism during her recent pregnancy. She has now safely delivered a healthy bouncing baby boy. At this time recommendation is for patient to continue Lovenox and begin Coumadin 5 mg daily. Patient is breast-feeding her son. She again asked about  xeralto and I recommended her not going on xeralto and she is breast-feeding. There is no real safety data of xeralto in breast-feeding.  #2 I have referred her to the Coumadin clinic. For ongoing monitoring of her INRs.  #3 patient will continue Coumadin and be seen in the Coumadin clinic. My plan is to continue a total of 9 months of Coumadin therapy for a pulmonary embolism since postpartum. She understands the risks and benefits. Patient apparently has had some hematomas of the breast. I do not think that she would be a good candidate for his overall total. Although she has discontinued breast-feeding.  Plan:  #1 continue Coumadin and be monitored in the Coumadin clinic.  #2 I will see her back in 6 months time for followup with the goal of hopefully being able to get her off Coumadin completely.  #3 prior to discontinuing the Coumadin I have gone ahead and scheduled her for a CT angiogram in followup. Patient is quite nervous about not having had another imaging study since she delivered the baby. Although I have tried to reassure her r that she remains asymptomatic her INRs have been daily that she should be doing well.  The length of time of the face-to-face encounter was  30  minutes. More than 50% of time was spent counseling and coordination of care.     Drue Second, MD Medical/Oncology [facility] [phone] (beeper) [phone] (Office)     [place], Konrad Dolores 07/20/2012

## 2012-07-26 ENCOUNTER — Encounter (HOSPITAL_COMMUNITY)
Admission: RE | Admit: 2012-07-26 | Discharge: 2012-07-26 | Disposition: A | Discharge status: Home / Self Care | Payer: BC Managed Care – PPO | Source: Ambulatory Visit | Attending: Obstetrics and Gynecology | Admitting: Obstetrics and Gynecology

## 2012-07-26 DIAGNOSIS — O923 Agalactia: Secondary | ICD-10-CM | POA: Insufficient documentation

## 2012-08-17 ENCOUNTER — Ambulatory Visit (HOSPITAL_BASED_OUTPATIENT_CLINIC_OR_DEPARTMENT_OTHER): Payer: BC Managed Care – PPO | Admitting: Pharmacist

## 2012-08-17 ENCOUNTER — Other Ambulatory Visit: Payer: BC Managed Care – PPO

## 2012-08-17 DIAGNOSIS — Z86718 Personal history of other venous thrombosis and embolism: Secondary | ICD-10-CM

## 2012-08-17 LAB — PROTIME-INR

## 2012-08-17 LAB — POCT INR: INR: 2.8

## 2012-08-17 NOTE — Progress Notes (Signed)
INR within goal today. No problems to report. Pt doing well. No missed doses.  No changes in diet. Wellbutrin has been increased to 300mg  daily. The prescription has been called to her pharmacy. She plans to pick it up in the next few days. Will continue Coumadin 15 mg daily. Recheck INR in 4 weeks on 09/16/12; lab at 11:00am and coumadin clinic at 11:15 am.

## 2012-08-17 NOTE — Patient Instructions (Signed)
Continue Coumadin 15 mg daily. Recheck INR in 4 weeks on 09/16/12; lab at 11:00am and coumadin clinic at 11:15 am.

## 2012-08-25 ENCOUNTER — Encounter (HOSPITAL_COMMUNITY)
Admission: RE | Admit: 2012-08-25 | Discharge: 2012-08-25 | Disposition: A | Discharge status: Home / Self Care | Payer: BC Managed Care – PPO | Source: Ambulatory Visit | Attending: Obstetrics and Gynecology | Admitting: Obstetrics and Gynecology

## 2012-08-25 DIAGNOSIS — O923 Agalactia: Secondary | ICD-10-CM | POA: Insufficient documentation

## 2012-09-03 ENCOUNTER — Ambulatory Visit (HOSPITAL_COMMUNITY): Payer: BC Managed Care – PPO

## 2012-09-04 ENCOUNTER — Other Ambulatory Visit (HOSPITAL_COMMUNITY): Payer: BC Managed Care – PPO

## 2012-09-11 ENCOUNTER — Ambulatory Visit (HOSPITAL_COMMUNITY)
Admission: RE | Admit: 2012-09-11 | Discharge: 2012-09-11 | Disposition: A | Discharge status: Home / Self Care | Payer: BC Managed Care – PPO | Source: Ambulatory Visit | Attending: Obstetrics and Gynecology | Admitting: Obstetrics and Gynecology

## 2012-09-11 DIAGNOSIS — Z308 Encounter for other contraceptive management: Secondary | ICD-10-CM

## 2012-09-11 DIAGNOSIS — Z3049 Encounter for surveillance of other contraceptives: Secondary | ICD-10-CM | POA: Insufficient documentation

## 2012-09-11 MED ORDER — IOHEXOL 300 MG/ML  SOLN
10.0000 mL | Freq: Once | INTRAMUSCULAR | Status: AC | PRN
  Administered 2012-09-11: 10 mL

## 2012-09-16 ENCOUNTER — Ambulatory Visit (HOSPITAL_BASED_OUTPATIENT_CLINIC_OR_DEPARTMENT_OTHER): Payer: BC Managed Care – PPO | Admitting: Pharmacist

## 2012-09-16 ENCOUNTER — Other Ambulatory Visit (HOSPITAL_BASED_OUTPATIENT_CLINIC_OR_DEPARTMENT_OTHER): Payer: BC Managed Care – PPO | Admitting: Lab

## 2012-09-16 DIAGNOSIS — Z86718 Personal history of other venous thrombosis and embolism: Secondary | ICD-10-CM

## 2012-09-16 LAB — PROTIME-INR
INR: 3.7 — ABNORMAL HIGH (ref 2.00–3.50)
Protime: 44.4 Seconds — ABNORMAL HIGH (ref 10.6–13.4)

## 2012-09-16 LAB — POCT INR: INR: 3.7

## 2012-09-16 NOTE — Progress Notes (Signed)
INR above goal today.  No changes in medications, diet or overall health.  Unsure why for increase in INR.  Heather Vargas has been stable on coumadin 15mg  daily since April 2014.  Will hold coumadin today, then resume 15mg  daily and check PT/INR in 2 weeks.

## 2012-09-23 NOTE — Progress Notes (Signed)
.. Subjective:    Heather Vargas is being seen today for her first obstetrical visit.  She is 102w1d determined by: Patient's last menstrual period was 06/24/2011.Marland Kitchen  Ultrasound: YES; 08/23/11--seen by Dr. Su Hilt and u/s showed [facility] c/w LMP HPI: Pt found out she was pregnant around 6 weeks when admitted to [facility] 07/31/11-08/02/11 and dx'd w/ PE. 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  PE dx'd on 08/01/11 and started on Lovenox at that time.  She has since debated terminating the pregnancy, But has ultimately decided to continue.  Followed by Dr. Welton Flakes for anticoagulant therapy and last there on 08/31/11 b/c of cont'd SOB w/ activity  (which has presently improved). Pt referred to MFM, and seen by Dr. Claudean Severance on 09/03/11 and notes outline management of pregnancy and early PP period.  Relationship w FOB: engaged  She reports spotting 3 days ago, and weekend before last; no IC since initial incident.  She denies nausea/vomiting.  C/o Lt side/LLQ pain & constipation on 09/02/11.  Her obstetrical history is significant for: 1. PE dx'd at 6 weeks--on Lovenox 130mg  [place] bid 2. CHTN--started Labetalol 100mg  po bid at dx of PE 3. PCOS w/ questionable h/o DM--Metformin until found out pregnant 4. Morbidly obese 5. H/o depression--Prozac prior to pregnancy 6. H/o anemia 7. H/o polyhdramnios w/ G1 and induced 8. 1st trimester spotting  Review of Systems Pertinent ROS is described in HPI .Marland KitchenNo Known Allergies .Marland Kitchen Past Medical History  Diagnosis Date  . H/O varicella   . H/O migraine   . Yeast infection   . Bacterial infection   . Trichomonas     as a teen  . Irregular menstrual cycle 2012  . Increased BMI 12/11/10  . Vitamin D deficiency 12/11/10  . History of PCOS 12/11/10  . Headache(784.0)     Migraines;was on Topamax last year;no longer taking  . PMS (premenstrual syndrome) 04/09/11    was Rx'd Prozac   . Anemia     during 1st pregnancy;iron supp taken  . Hypertension     on Labetolol 100mg  bid  . Pulmonary embolism 2013    on Lovenox 130mg  bid  . Morbidly obese 09/10/2011  . Pregnancy 09/25/2011  . NSVD (normal spontaneous vaginal delivery) 03/24/2012  . History of depression 09/10/2011    Prozac in the past  ..  Objective:   BP 98/60  Wt 277 lb (125.646 kg)  BMI 50.65 kg/m2  LMP 06/24/2011 Wt Readings from Last 1 Encounters:  07/20/12 272 lb 12.8 oz (123.741 kg)   BMI: Body mass index is 50.65 kg/(m^2).  General: alert, cooperative and no distress HEENT: grossly normal  Thyroid: normal  Respiratory: clear to auscultation bilaterally Cardiovascular: regular rate and rhythm  Breasts:  No dominant masses, nipples erect Gastrointestinal: soft, non-tender; no masses,  no organomegaly Extremities: extremities normal, no pain or edema Pelvic exam deferred  U/s for FHT: SIUP w/ S=D ([redacted]w[redacted]d) w/ FHR=171 bpm; normal adnexa; no FF in CDS  Assessment:    High Risk Pregnancy at [redacted]w[redacted]d PE dx'd 07/31/11 at 6 weeks and on Lovenox 130mg  [place] bid since--followed by Dr. Welton Flakes CHTN--on Labetalol 100mg  po bid since 07/31/11 Morbidly obese PCOS w/ questionable h/o DM  Plan:     Prenatal labs rv'd & WNL from 08/23/11, including Hgb Electrophoresis Pap smear collected:  no GC/Chlamydia collected:  no Wet prep:  Not done  Discussion of  Genetic testing options: desires first trimester screen Problem list reviewed and updated. rv'd how and when to call for emergencies rv'd practice routines Discussed nutrition and exercise and common pregnancy discomforts  Dr. Su Hilt plans for monthly EFW; baseline 24 hr urine and PIH labs.  F/u 1-2 weeks for 1st trimester screen; 4 weeks for ROB w/ MD, or prn  C. Denny Levy, CNM

## 2012-09-30 ENCOUNTER — Ambulatory Visit (HOSPITAL_BASED_OUTPATIENT_CLINIC_OR_DEPARTMENT_OTHER): Payer: BC Managed Care – PPO | Admitting: Pharmacist

## 2012-09-30 ENCOUNTER — Other Ambulatory Visit: Payer: BC Managed Care – PPO | Admitting: Lab

## 2012-09-30 DIAGNOSIS — Z7901 Long term (current) use of anticoagulants: Secondary | ICD-10-CM

## 2012-09-30 DIAGNOSIS — Z86718 Personal history of other venous thrombosis and embolism: Secondary | ICD-10-CM

## 2012-09-30 NOTE — Progress Notes (Signed)
INR within goal today. No problems to report regarding anticoagulation. Occasional cramping in left calf area. Comes and goes. No associated swelling, warmth or redness. This is noticed more when she has done more walking. No changes in diet, medications, etc. Continue Coumadin 15 mg daily. Recheck INR in 4 weeks on 10/28/12; lab at 11:00am and coumadin clinic at 11:15 am.

## 2012-09-30 NOTE — Patient Instructions (Signed)
Continue Coumadin 15 mg daily. Recheck INR in 4 weeks on 10/28/12; lab at 11:00am and coumadin clinic at 11:15 am.

## 2012-10-16 ENCOUNTER — Other Ambulatory Visit: Payer: Self-pay | Admitting: Pharmacist

## 2012-10-16 DIAGNOSIS — I2699 Other pulmonary embolism without acute cor pulmonale: Secondary | ICD-10-CM

## 2012-10-16 MED ORDER — WARFARIN SODIUM 10 MG PO TABS: 15.0000 mg | ORAL_TABLET | Freq: Every day | ORAL | Status: DC

## 2012-10-28 ENCOUNTER — Encounter: Payer: Self-pay | Admitting: Medical Oncology

## 2012-10-28 ENCOUNTER — Other Ambulatory Visit (HOSPITAL_BASED_OUTPATIENT_CLINIC_OR_DEPARTMENT_OTHER): Payer: BC Managed Care – PPO | Admitting: Lab

## 2012-10-28 ENCOUNTER — Ambulatory Visit: Payer: BC Managed Care – PPO | Admitting: Pharmacist

## 2012-10-28 DIAGNOSIS — Z86718 Personal history of other venous thrombosis and embolism: Secondary | ICD-10-CM

## 2012-10-28 DIAGNOSIS — I2699 Other pulmonary embolism without acute cor pulmonale: Secondary | ICD-10-CM

## 2012-10-28 LAB — PROTIME-INR: INR: 2.3 (ref 2.00–3.50)

## 2012-10-28 LAB — POCT INR: INR: 2.3

## 2012-10-28 NOTE — Progress Notes (Signed)
Patient in coumadin clinic today informed pharmacist that she was never contacted regarding appt for CT angiogram to f/u for PE as ordered in June by Dr Welton Flakes. F/U with patient and informed her new order placed and patient to expect call from scheduling regarding appt to have the CT completed. Patient to call office should she not here from scheduling, or for any questions or concerns.

## 2012-10-28 NOTE — Progress Notes (Signed)
INR = 2.3 on Coumadin 15 mg daily Only complaint is that she has not been scheduled for CT angiogram.  Pt has made attempts to call scheduling but no luck. She is due to complete Coumadin in December (?maybe sooner if CT is clear).  She is seeing Dr. Welton Flakes on 12/30/12 & should have the CT angiogram prior to that appt. She is no longer breast feeding. INR at goal.  No change to Coumadin dose. I will contact Dr. Milta Deiters RN to make sure the angiogram is scheduled & pt is called w/ an appt. Return for protime in 5 weeks. Ebony Hail, Pharm.D., CPP 10/28/2012@11 :53 AM

## 2012-10-30 ENCOUNTER — Telehealth: Payer: Self-pay | Admitting: Medical Oncology

## 2012-10-30 NOTE — Telephone Encounter (Signed)
F/U call with patient regarding CT angio. Patient scheduled 12/22/2012. No questions at this time, knows to call office with any questions or concerns.

## 2012-11-03 ENCOUNTER — Telehealth: Payer: Self-pay | Admitting: Oncology

## 2012-11-03 NOTE — Telephone Encounter (Signed)
, °

## 2012-12-02 ENCOUNTER — Ambulatory Visit (HOSPITAL_BASED_OUTPATIENT_CLINIC_OR_DEPARTMENT_OTHER): Payer: BC Managed Care – PPO | Admitting: Pharmacist

## 2012-12-02 ENCOUNTER — Encounter (INDEPENDENT_AMBULATORY_CARE_PROVIDER_SITE_OTHER): Payer: Self-pay

## 2012-12-02 ENCOUNTER — Other Ambulatory Visit (HOSPITAL_BASED_OUTPATIENT_CLINIC_OR_DEPARTMENT_OTHER): Payer: BC Managed Care – PPO | Admitting: Lab

## 2012-12-02 DIAGNOSIS — Z7901 Long term (current) use of anticoagulants: Secondary | ICD-10-CM

## 2012-12-02 DIAGNOSIS — Z86718 Personal history of other venous thrombosis and embolism: Secondary | ICD-10-CM

## 2012-12-02 DIAGNOSIS — Z5181 Encounter for therapeutic drug level monitoring: Secondary | ICD-10-CM

## 2012-12-02 LAB — PROTIME-INR: INR: 2.3 (ref 2.00–3.50)

## 2012-12-02 NOTE — Progress Notes (Signed)
INR at goal Pt is doing well with no complaints today She should only have ~ 1 month left of coumadin therapy She has been set up for her CT angiogram next month on 11/18 and she sees Dr. Welton Flakes the following week Pt reports no bleeding/bruising No missed doses ore extra doses No medication or diet changes Pt has been stable on her current dose Plan to continue Coumadin 15 mg daily. Recheck INR on 12/22/12; lab at 9:30 am and coumadin clinic at 9:45 am, CT scan at 10:30am

## 2012-12-02 NOTE — Patient Instructions (Signed)
INR at goal No changes Continue Coumadin 15 mg daily. Recheck INR on 12/22/12; lab at 9:30 am and coumadin clinic at 9:45 am. And CT scan at 10:30am

## 2012-12-22 ENCOUNTER — Encounter (HOSPITAL_COMMUNITY): Payer: Self-pay

## 2012-12-22 ENCOUNTER — Ambulatory Visit (HOSPITAL_BASED_OUTPATIENT_CLINIC_OR_DEPARTMENT_OTHER): Payer: Self-pay | Admitting: Pharmacist

## 2012-12-22 ENCOUNTER — Other Ambulatory Visit (HOSPITAL_BASED_OUTPATIENT_CLINIC_OR_DEPARTMENT_OTHER): Payer: BC Managed Care – PPO | Admitting: Lab

## 2012-12-22 ENCOUNTER — Ambulatory Visit (HOSPITAL_COMMUNITY)
Admission: RE | Admit: 2012-12-22 | Discharge: 2012-12-22 | Disposition: A | Discharge status: Home / Self Care | Payer: BC Managed Care – PPO | Source: Ambulatory Visit | Attending: Oncology | Admitting: Oncology

## 2012-12-22 DIAGNOSIS — Z7901 Long term (current) use of anticoagulants: Secondary | ICD-10-CM | POA: Insufficient documentation

## 2012-12-22 DIAGNOSIS — Z86711 Personal history of pulmonary embolism: Secondary | ICD-10-CM | POA: Insufficient documentation

## 2012-12-22 DIAGNOSIS — Z86718 Personal history of other venous thrombosis and embolism: Secondary | ICD-10-CM

## 2012-12-22 DIAGNOSIS — I2699 Other pulmonary embolism without acute cor pulmonale: Secondary | ICD-10-CM

## 2012-12-22 LAB — CBC WITH DIFFERENTIAL/PLATELET
Basophils Absolute: 0 10*3/uL (ref 0.0–0.1)
EOS%: 0.9 % (ref 0.0–7.0)
Eosinophils Absolute: 0 10*3/uL (ref 0.0–0.5)
HCT: 33.5 % — ABNORMAL LOW (ref 34.8–46.6)
HGB: 10.5 g/dL — ABNORMAL LOW (ref 11.6–15.9)
MCH: 25.8 pg (ref 25.1–34.0)
MCV: 82.4 fL (ref 79.5–101.0)
NEUT#: 1.6 10*3/uL (ref 1.5–6.5)
NEUT%: 47.1 % (ref 38.4–76.8)
RDW: 14.9 % — ABNORMAL HIGH (ref 11.2–14.5)
lymph#: 1.6 10*3/uL (ref 0.9–3.3)

## 2012-12-22 LAB — COMPREHENSIVE METABOLIC PANEL (CC13)
ALT: 15 U/L (ref 0–55)
AST: 15 U/L (ref 5–34)
Anion Gap: 7 mEq/L (ref 3–11)
CO2: 24 mEq/L (ref 22–29)
Chloride: 108 mEq/L (ref 98–109)
Creatinine: 0.9 mg/dL (ref 0.6–1.1)
Glucose: 102 mg/dl (ref 70–140)
Sodium: 140 mEq/L (ref 136–145)
Total Bilirubin: 0.37 mg/dL (ref 0.20–1.20)
Total Protein: 7.1 g/dL (ref 6.4–8.3)

## 2012-12-22 LAB — POCT INR: INR: 2.1

## 2012-12-22 LAB — PROTIME-INR

## 2012-12-22 MED ORDER — IOHEXOL 350 MG/ML SOLN
100.0000 mL | Freq: Once | INTRAVENOUS | Status: AC | PRN
  Administered 2012-12-22: 100 mL via INTRAVENOUS

## 2012-12-22 NOTE — Progress Notes (Signed)
INR at goal Pt is doing well today No complaints No complications regarding anticoagulation No missed or extra doses No medication or diet changes We will make no changes to patient's regimen Pt may complete her course of coumadin therapy next month. Will f/u with Dr. Milta Deiters recommendations Plan for now: Continue Coumadin 15 mg daily. Recheck INR on 01/13/13; lab at 8 am and coumadin clinic at 8:15 am.

## 2012-12-22 NOTE — Patient Instructions (Signed)
INR at goal Continue Coumadin 15 mg daily. Recheck INR on 01/13/13; lab at 8 am and coumadin clinic at 8:15 am.

## 2012-12-28 ENCOUNTER — Encounter: Payer: Self-pay | Admitting: Oncology

## 2012-12-28 ENCOUNTER — Ambulatory Visit (HOSPITAL_BASED_OUTPATIENT_CLINIC_OR_DEPARTMENT_OTHER): Payer: BC Managed Care – PPO | Admitting: Oncology

## 2012-12-28 ENCOUNTER — Telehealth: Payer: Self-pay | Admitting: Oncology

## 2012-12-28 VITALS — BP 136/81 | HR 96 | Temp 98.5°F | Resp 18 | Ht 62.0 in | Wt 274.0 lb

## 2012-12-28 DIAGNOSIS — E663 Overweight: Secondary | ICD-10-CM

## 2012-12-28 DIAGNOSIS — Z7901 Long term (current) use of anticoagulants: Secondary | ICD-10-CM

## 2012-12-28 DIAGNOSIS — I2699 Other pulmonary embolism without acute cor pulmonale: Secondary | ICD-10-CM

## 2012-12-30 ENCOUNTER — Ambulatory Visit: Payer: BC Managed Care – PPO | Admitting: Oncology

## 2013-01-04 NOTE — Progress Notes (Signed)
ID: Heather Vargas  DOB: 10-22-1980  MR#: [id number]  CSN#: [id number]   Interval History:  Overall patient is doing well. She continues on Coumadin tolerating it very nicely no bleeding issues noted. She denies any fevers chills night sweats headaches shortness of breath chest pains palpitations no myalgias and arthralgias. Remainder of the 10 point review of systems is negative.    Objective:  Filed Vitals:   12/28/12 1147  BP: 136/81  Pulse: 96  Temp: 98.5 F (36.9 C)  Resp: 18    BMI: Body mass index is 50.1 kg/(m^2).   ECOG FS: 1  Physical Exam: Patient is awake alert in no acute distress well-developed well-nourished female Lungs clear to auscultation Cardiovascular regular rate rhythm Abdomen soft nontender nondistended bowel sounds are present Extremities no edema   Lab Results:      Chemistry      Component Value Date/Time   NA 140 12/22/2012 0948   NA 134* 05/22/2012 0900   K 4.1 12/22/2012 0948   K 3.9 05/22/2012 0900   CL 101 05/22/2012 0900   CL 109* 02/17/2012 0902   CO2 24 12/22/2012 0948   CO2 25 05/22/2012 0900   BUN 6.3* 12/22/2012 0948   BUN 12 05/22/2012 0900   CREATININE 0.9 12/22/2012 0948   CREATININE 0.91 05/22/2012 0900   CREATININE 0.67 02/19/2012 1736      Component Value Date/Time   CALCIUM 9.4 12/22/2012 0948   CALCIUM 9.7 05/22/2012 0900   ALKPHOS 73 12/22/2012 0948   ALKPHOS 159* 03/23/2012 2100   AST 15 12/22/2012 0948   AST 15 03/23/2012 2100   ALT 15 12/22/2012 0948   ALT 7 03/23/2012 2100   BILITOT 0.37 12/22/2012 0948   BILITOT 0.4 03/23/2012 2100       Lab Results  Component Value Date   WBC 3.5* 12/22/2012   HGB 10.5* 12/22/2012   HCT 33.5* 12/22/2012   MCV 82.4 12/22/2012   PLT 233 12/22/2012   NEUTROABS 1.6 12/22/2012    Studies/Results:   Assessment: 32 year old female with   #1History of pulmonary embolism during her recent pregnancy. She continues on Coumadin. She is followed in the Coumadin clinic. We discussed  ongoing anticoagulation. I do think she is at high risk patient do to her being overweight. She is not exercising as much. We discussed that as well.   Plan:  #1 continue Coumadin. She'll be followed in the Coumadin clinic. Plan is to continue anticoagulation indefinitely. I do believe in my clinical judgment she is at high risk for recurrence of thrombosis. She and I discussed this extensively and she is in agreement.  #2 I will see her back in 6-12 months time.  The length of time of the face-to-face encounter was  30    minutes. More than 50% of time was spent counseling and coordination of care.     Drue Second, MD Medical/Oncology [facility] [phone] (beeper) [phone] (Office)     [place], Konrad Dolores 01/04/2013

## 2013-01-13 ENCOUNTER — Other Ambulatory Visit: Payer: BC Managed Care – PPO

## 2013-01-13 ENCOUNTER — Ambulatory Visit: Payer: BC Managed Care – PPO

## 2013-01-13 ENCOUNTER — Telehealth: Payer: Self-pay | Admitting: Pharmacist

## 2013-01-13 NOTE — Telephone Encounter (Signed)
Patient FTKA today for lab and coumadin clinic visit. Called and left VM for patient to call back and reschedule for lab and coumadin visit  Christell Faith, PharmD

## 2013-01-25 ENCOUNTER — Telehealth: Payer: Self-pay | Admitting: Pharmacist

## 2013-02-22 ENCOUNTER — Other Ambulatory Visit: Payer: Self-pay | Admitting: Pharmacist

## 2013-02-22 DIAGNOSIS — I2699 Other pulmonary embolism without acute cor pulmonale: Secondary | ICD-10-CM

## 2013-02-22 MED ORDER — WARFARIN SODIUM 10 MG PO TABS: 15.0000 mg | ORAL_TABLET | Freq: Every day | ORAL | Status: DC

## 2013-02-22 NOTE — Telephone Encounter (Signed)
Prescription refill for warfarin 10mg  tablets (90 day supply) [facility] [address].

## 2013-03-02 ENCOUNTER — Other Ambulatory Visit (HOSPITAL_BASED_OUTPATIENT_CLINIC_OR_DEPARTMENT_OTHER): Payer: BC Managed Care – PPO

## 2013-03-02 ENCOUNTER — Ambulatory Visit (HOSPITAL_BASED_OUTPATIENT_CLINIC_OR_DEPARTMENT_OTHER): Payer: BC Managed Care – PPO | Admitting: Pharmacist

## 2013-03-02 DIAGNOSIS — Z7901 Long term (current) use of anticoagulants: Secondary | ICD-10-CM

## 2013-03-02 DIAGNOSIS — Z86718 Personal history of other venous thrombosis and embolism: Secondary | ICD-10-CM

## 2013-03-02 LAB — PROTIME-INR
INR: 1.5 — ABNORMAL LOW (ref 2.00–3.50)
PROTIME: 18 s — AB (ref 10.6–13.4)

## 2013-03-02 LAB — POCT INR: INR: 1.5

## 2013-03-02 NOTE — Progress Notes (Signed)
INR = 1.5 on Coumadin 15 mg daily. Pt has missed at least 2 doses of Coumadin this past week.   No SOB, CP, LE swelling or pain. Med change: pt stopped her Wellbutrin on her own. Pt is uncertain about being on Coumadin indefinitely.  She had a neg coag panel 08/2011.  She is s/p Essure sterilization.  CT angiogram neg for clot She plans to discuss this further w/ Dr. Welton FlakesKhan.  She is felt to be at risk for clot due to obesity & sedentary. INR subtherapetic due to missed doses of Coumadin. Coumadin 20 mg today then back to 15 mg daily. Follow along w/ plan for indefinite anticoagulation.  Pt is scheduled to see Dr. Welton FlakesKhan in June 2015. Return for protime 03/19/13. Ebony HailGinna Tonesha Tsou, Pharm.D., CPP 03/02/2013@2 :41 PM

## 2013-03-19 ENCOUNTER — Encounter (INDEPENDENT_AMBULATORY_CARE_PROVIDER_SITE_OTHER): Payer: Self-pay

## 2013-03-19 ENCOUNTER — Other Ambulatory Visit (HOSPITAL_BASED_OUTPATIENT_CLINIC_OR_DEPARTMENT_OTHER): Payer: BC Managed Care – PPO

## 2013-03-19 ENCOUNTER — Ambulatory Visit (HOSPITAL_BASED_OUTPATIENT_CLINIC_OR_DEPARTMENT_OTHER): Payer: BC Managed Care – PPO | Admitting: Pharmacist

## 2013-03-19 DIAGNOSIS — Z86718 Personal history of other venous thrombosis and embolism: Secondary | ICD-10-CM

## 2013-03-19 DIAGNOSIS — Z7901 Long term (current) use of anticoagulants: Secondary | ICD-10-CM

## 2013-03-19 LAB — POCT INR: INR: 3.9

## 2013-03-19 LAB — PROTIME-INR
INR: 3.9 — ABNORMAL HIGH (ref 2.00–3.50)
Protime: 46.8 Seconds — ABNORMAL HIGH (ref 10.6–13.4)

## 2013-03-19 NOTE — Progress Notes (Signed)
INR above goal Patient is doing well today with no complaints No unusual bleeding or bruising No missed or extra doses Pt has started her Wellbutrin again (no interaction with coumadin) Heather Vargas's appetite has been less than normal possibly due to mood/weather If INR is elevated at next visit may need to decrease her dose but since she has been stable on this dose for > 6 months will not change today Plan: No coumadin today, tomorrow only take 1 tablet (10 mg total).  Then continue 15 mg (1.5 tabs) daily Recheck INR on 04/09/13; lab at 2 pm and coumadin clinic at 2:15 pm.

## 2013-03-19 NOTE — Patient Instructions (Signed)
INR above goal No coumadin today, tomorrow only take 1 tablet (10 mg total).  Then continue 15 mg (1.5 tabs) daily Recheck INR on 04/09/13; lab at 2 pm and coumadin clinic at 2:15 pm.

## 2013-04-09 ENCOUNTER — Other Ambulatory Visit: Payer: BC Managed Care – PPO

## 2013-04-09 ENCOUNTER — Ambulatory Visit: Payer: BC Managed Care – PPO

## 2013-05-06 ENCOUNTER — Telehealth: Payer: Self-pay | Admitting: Pharmacist

## 2013-05-06 NOTE — Telephone Encounter (Signed)
Left VM to schedule appt at Coumadin clinic.

## 2013-05-07 ENCOUNTER — Encounter: Payer: Self-pay | Admitting: Pharmacist

## 2013-05-07 ENCOUNTER — Other Ambulatory Visit (HOSPITAL_BASED_OUTPATIENT_CLINIC_OR_DEPARTMENT_OTHER): Payer: BC Managed Care – PPO

## 2013-05-07 ENCOUNTER — Ambulatory Visit (HOSPITAL_BASED_OUTPATIENT_CLINIC_OR_DEPARTMENT_OTHER): Payer: BC Managed Care – PPO | Admitting: Pharmacist

## 2013-05-07 DIAGNOSIS — I2699 Other pulmonary embolism without acute cor pulmonale: Secondary | ICD-10-CM

## 2013-05-07 DIAGNOSIS — Z86718 Personal history of other venous thrombosis and embolism: Secondary | ICD-10-CM

## 2013-05-07 LAB — CBC WITH DIFFERENTIAL/PLATELET
BASO%: 0.6 % (ref 0.0–2.0)
BASOS ABS: 0 10*3/uL (ref 0.0–0.1)
EOS%: 0.8 % (ref 0.0–7.0)
Eosinophils Absolute: 0 10*3/uL (ref 0.0–0.5)
HEMATOCRIT: 33.8 % — AB (ref 34.8–46.6)
HEMOGLOBIN: 10.6 g/dL — AB (ref 11.6–15.9)
LYMPH#: 1.8 10*3/uL (ref 0.9–3.3)
LYMPH%: 36.1 % (ref 14.0–49.7)
MCH: 25 pg — ABNORMAL LOW (ref 25.1–34.0)
MCHC: 31.4 g/dL — AB (ref 31.5–36.0)
MCV: 79.6 fL (ref 79.5–101.0)
MONO#: 0.2 10*3/uL (ref 0.1–0.9)
MONO%: 4.4 % (ref 0.0–14.0)
NEUT#: 2.9 10*3/uL (ref 1.5–6.5)
NEUT%: 58.1 % (ref 38.4–76.8)
Platelets: 298 10*3/uL (ref 145–400)
RBC: 4.25 10*6/uL (ref 3.70–5.45)
RDW: 16.3 % — ABNORMAL HIGH (ref 11.2–14.5)
WBC: 5 10*3/uL (ref 3.9–10.3)

## 2013-05-07 LAB — COMPREHENSIVE METABOLIC PANEL (CC13)
ALBUMIN: 3.6 g/dL (ref 3.5–5.0)
ALT: 11 U/L (ref 0–55)
ANION GAP: 9 meq/L (ref 3–11)
AST: 13 U/L (ref 5–34)
Alkaline Phosphatase: 78 U/L (ref 40–150)
BUN: 13.3 mg/dL (ref 7.0–26.0)
CALCIUM: 9.2 mg/dL (ref 8.4–10.4)
CHLORIDE: 109 meq/L (ref 98–109)
CO2: 20 meq/L — AB (ref 22–29)
Creatinine: 0.9 mg/dL (ref 0.6–1.1)
Glucose: 102 mg/dl (ref 70–140)
Potassium: 4 mEq/L (ref 3.5–5.1)
Sodium: 138 mEq/L (ref 136–145)
Total Bilirubin: 0.55 mg/dL (ref 0.20–1.20)
Total Protein: 7.5 g/dL (ref 6.4–8.3)

## 2013-05-07 LAB — PROTIME-INR
INR: 3.5 (ref 2.00–3.50)
Protime: 42 Seconds — ABNORMAL HIGH (ref 10.6–13.4)

## 2013-05-07 LAB — POCT INR: INR: 3.5

## 2013-05-07 NOTE — Progress Notes (Signed)
INR above goal today at 3.5 Patient is doing well with only complaint of being tired/fatigued. She reports no missed doses or extra doses No unusual bleeding with some minor bruising on arms and leg that have now healed with no issue No medication or diet changes Pt did have 1 alcoholic drink yesterday after work [name]'s INR has been > 3 the last 2 visits so will make a slight dose decrease. Ms. Heather Vargas feels a little frustrated about being on coumadin indefinitely and was asking about the possibility of a getting a Home INR monitor.  I think this is reasonable and will look into seeing if we can get her a home INR monitor for use. Plan for now:  Decrease to 15 mg daily except for 10 mg on Mondays and Fridays. Recheck INR on 05/28/13; lab at 9am and coumadin clinic at 9:15am.

## 2013-05-07 NOTE — Patient Instructions (Addendum)
INR above goal today Decrease to 15 mg daily except for 10 mg on Mondays and Fridays. Recheck INR on 05/28/13; lab at 9am and coumadin clinic at 9:15am.

## 2013-05-19 ENCOUNTER — Telehealth: Payer: Self-pay

## 2013-05-19 NOTE — Telephone Encounter (Signed)
Returned call to Anya at [facility] [phone] confirming pt DOB for the INR at home self test.  Anya voiced understanding.

## 2013-05-26 ENCOUNTER — Other Ambulatory Visit: Payer: Self-pay | Admitting: Pharmacist

## 2013-05-26 DIAGNOSIS — I2699 Other pulmonary embolism without acute cor pulmonale: Secondary | ICD-10-CM

## 2013-05-28 ENCOUNTER — Other Ambulatory Visit (HOSPITAL_BASED_OUTPATIENT_CLINIC_OR_DEPARTMENT_OTHER): Payer: BC Managed Care – PPO

## 2013-05-28 ENCOUNTER — Ambulatory Visit (HOSPITAL_BASED_OUTPATIENT_CLINIC_OR_DEPARTMENT_OTHER): Payer: BC Managed Care – PPO | Admitting: Pharmacist

## 2013-05-28 DIAGNOSIS — I2699 Other pulmonary embolism without acute cor pulmonale: Secondary | ICD-10-CM

## 2013-05-28 DIAGNOSIS — Z86718 Personal history of other venous thrombosis and embolism: Secondary | ICD-10-CM

## 2013-05-28 LAB — PROTIME-INR
INR: 3.2 (ref 2.00–3.50)
Protime: 38.4 Seconds — ABNORMAL HIGH (ref 10.6–13.4)

## 2013-05-28 LAB — POCT INR: INR: 3.2

## 2013-05-28 NOTE — Progress Notes (Signed)
INR just above goal Pt is doing well today but is still tired/fatigued No extra or missed doses No unusual bleeding or bruising Pt had 2 salads this week and was asking about starting "juicing" to improve her health and lose weight/gain energy I told her this will likely effect her INR and decrease the level requiring dose adjustments/increases but that if she wants to make this lifestyle change then that is great. We will likely need to monitor her more closely when/if she starts this She is currently only looking into this and doing research. No planned start date for these diet changes Form has been faxed for request for INR home monitoring but we are still waiting on approval Plan: Decrease to 15 mg daily except for 10 mg on Mondays, Wednesdays, and Fridays. Recheck INR on 06/25/13; lab at 9am and coumadin clinic at 9:15am.

## 2013-05-28 NOTE — Patient Instructions (Signed)
INR just above goal Decrease to 15 mg daily except for 10 mg on Mondays, Wednesdays, and Fridays. Recheck INR on 06/25/13; lab at 9am and coumadin clinic at 9:15am.

## 2013-06-16 ENCOUNTER — Encounter: Payer: Self-pay | Admitting: Pharmacist

## 2013-06-16 ENCOUNTER — Other Ambulatory Visit: Payer: Self-pay | Admitting: Pharmacist

## 2013-06-16 DIAGNOSIS — I2699 Other pulmonary embolism without acute cor pulmonale: Secondary | ICD-10-CM

## 2013-06-16 MED ORDER — WARFARIN SODIUM 10 MG PO TABS: 10.0000 mg | ORAL_TABLET | Freq: Every day | ORAL | Status: DC

## 2013-06-22 ENCOUNTER — Telehealth: Payer: Self-pay | Admitting: Hematology and Oncology

## 2013-06-22 NOTE — Telephone Encounter (Signed)
, °

## 2013-06-25 ENCOUNTER — Ambulatory Visit: Payer: BC Managed Care – PPO

## 2013-06-25 ENCOUNTER — Telehealth: Payer: Self-pay | Admitting: Pharmacist

## 2013-06-25 ENCOUNTER — Other Ambulatory Visit: Payer: BC Managed Care – PPO

## 2013-06-25 NOTE — Telephone Encounter (Signed)
Patient FTKA today in Coumadin Clinic.  Left VM asking her to call and reschedule.

## 2013-07-12 ENCOUNTER — Other Ambulatory Visit: Payer: BC Managed Care – PPO

## 2013-07-12 ENCOUNTER — Ambulatory Visit: Payer: BC Managed Care – PPO | Admitting: Oncology

## 2013-07-15 ENCOUNTER — Telehealth: Payer: Self-pay | Admitting: Pharmacist

## 2013-08-03 ENCOUNTER — Ambulatory Visit (HOSPITAL_BASED_OUTPATIENT_CLINIC_OR_DEPARTMENT_OTHER): Payer: BC Managed Care – PPO | Admitting: Pharmacist

## 2013-08-03 ENCOUNTER — Telehealth: Payer: Self-pay | Admitting: Pharmacist

## 2013-08-03 ENCOUNTER — Other Ambulatory Visit: Payer: BC Managed Care – PPO

## 2013-08-03 DIAGNOSIS — I2699 Other pulmonary embolism without acute cor pulmonale: Secondary | ICD-10-CM

## 2013-08-03 DIAGNOSIS — Z86718 Personal history of other venous thrombosis and embolism: Secondary | ICD-10-CM

## 2013-08-03 LAB — PROTIME-INR
INR: 2.2 (ref 2.00–3.50)
Protime: 26.4 Seconds — ABNORMAL HIGH (ref 10.6–13.4)

## 2013-08-03 LAB — POCT INR: INR: 2.2

## 2013-08-03 NOTE — Telephone Encounter (Signed)
Spoke with Kendal HymenBonnie at [facility] to f/u on pts application for home INR machine submitted in early April. Kendal HymenBonnie said she would call the patient today to talk about the application status, insurance coverage, meeting her deductible, etc Confirmed with Kendal HymenBonnie that she has the current contact information for Ms. Heather Vargas.

## 2013-08-03 NOTE — Progress Notes (Signed)
INR within goal today. Pt doing well. No problems or concerns regarding anticoagulation. Pt took coumadin as instructed at last visit. No changes in diet or medications. No missed or extra coumadin doses. No s/s of clotting noted. Will f/u on status of home INR machine. Continue 15 mg daily except for 10 mg Mondays, Wednesdays, and Fridays. Recheck INR on 09/29/13; lab at 9:30am, coumadin clinic at 9:45am and appointment with Dr. Pamelia Vargas at 10am.  Will order CBC for review at August visit.

## 2013-08-03 NOTE — Patient Instructions (Signed)
Continue 15 mg daily except for 10 mg Mondays, Wednesdays, and Fridays. Recheck INR on 09/29/13; lab at 9:30am, coumadin clinic at 9:45am and appointment with Dr. Pamelia HoitGudena at 10am.

## 2013-08-12 ENCOUNTER — Encounter: Payer: Self-pay | Admitting: Pharmacist

## 2013-08-12 NOTE — Progress Notes (Signed)
Received communication by fax from [facility] regarding home monitoring machine.  "Pt has declined our service for the INR self-testing for the selected reasons below. The patient will need to continue to test with your office or lab. Please contact me if you have any questions."  X Patient not interested in weekly self-testing service.

## 2013-09-13 NOTE — Telephone Encounter (Signed)
Phone call - encounter closed. 

## 2013-09-22 ENCOUNTER — Other Ambulatory Visit: Payer: BC Managed Care – PPO

## 2013-09-22 ENCOUNTER — Ambulatory Visit: Payer: BC Managed Care – PPO | Admitting: Hematology and Oncology

## 2013-09-28 ENCOUNTER — Telehealth: Payer: Self-pay | Admitting: Adult Health

## 2013-09-28 ENCOUNTER — Other Ambulatory Visit: Payer: Self-pay | Admitting: *Deleted

## 2013-09-28 DIAGNOSIS — Z86718 Personal history of other venous thrombosis and embolism: Secondary | ICD-10-CM

## 2013-09-28 NOTE — Telephone Encounter (Signed)
Cld pt to adv of time of appt moved from [facility]G -pt stated not to move wanted AM appt-put pt w/LC-pt understood

## 2013-09-29 ENCOUNTER — Ambulatory Visit: Payer: BC Managed Care – PPO

## 2013-09-29 ENCOUNTER — Telehealth: Payer: Self-pay | Admitting: Adult Health

## 2013-09-29 ENCOUNTER — Other Ambulatory Visit: Payer: BC Managed Care – PPO

## 2013-09-29 ENCOUNTER — Ambulatory Visit (HOSPITAL_BASED_OUTPATIENT_CLINIC_OR_DEPARTMENT_OTHER): Payer: BC Managed Care – PPO | Admitting: Adult Health

## 2013-09-29 ENCOUNTER — Ambulatory Visit: Payer: BC Managed Care – PPO | Admitting: Pharmacist

## 2013-09-29 ENCOUNTER — Encounter: Payer: Self-pay | Admitting: Adult Health

## 2013-09-29 ENCOUNTER — Other Ambulatory Visit (HOSPITAL_BASED_OUTPATIENT_CLINIC_OR_DEPARTMENT_OTHER): Payer: BC Managed Care – PPO

## 2013-09-29 ENCOUNTER — Ambulatory Visit: Payer: BC Managed Care – PPO | Admitting: Hematology and Oncology

## 2013-09-29 VITALS — BP 133/83 | HR 67 | Temp 98.4°F | Resp 18 | Ht 62.0 in | Wt 269.2 lb

## 2013-09-29 DIAGNOSIS — Z86718 Personal history of other venous thrombosis and embolism: Secondary | ICD-10-CM

## 2013-09-29 DIAGNOSIS — I2699 Other pulmonary embolism without acute cor pulmonale: Secondary | ICD-10-CM

## 2013-09-29 DIAGNOSIS — Z7901 Long term (current) use of anticoagulants: Secondary | ICD-10-CM

## 2013-09-29 LAB — COMPREHENSIVE METABOLIC PANEL (CC13)
ALK PHOS: 70 U/L (ref 40–150)
ALT: 13 U/L (ref 0–55)
AST: 14 U/L (ref 5–34)
Albumin: 3.6 g/dL (ref 3.5–5.0)
Anion Gap: 7 mEq/L (ref 3–11)
BILIRUBIN TOTAL: 0.46 mg/dL (ref 0.20–1.20)
BUN: 12.2 mg/dL (ref 7.0–26.0)
CO2: 23 meq/L (ref 22–29)
Calcium: 9.2 mg/dL (ref 8.4–10.4)
Chloride: 109 mEq/L (ref 98–109)
Creatinine: 1 mg/dL (ref 0.6–1.1)
Glucose: 104 mg/dl (ref 70–140)
Potassium: 3.9 mEq/L (ref 3.5–5.1)
Sodium: 139 mEq/L (ref 136–145)
TOTAL PROTEIN: 7.3 g/dL (ref 6.4–8.3)

## 2013-09-29 LAB — CBC WITH DIFFERENTIAL/PLATELET
BASO%: 0.5 % (ref 0.0–2.0)
Basophils Absolute: 0 10*3/uL (ref 0.0–0.1)
EOS ABS: 0 10*3/uL (ref 0.0–0.5)
EOS%: 0.7 % (ref 0.0–7.0)
HCT: 32.9 % — ABNORMAL LOW (ref 34.8–46.6)
HGB: 10 g/dL — ABNORMAL LOW (ref 11.6–15.9)
LYMPH%: 45.8 % (ref 14.0–49.7)
MCH: 23.6 pg — ABNORMAL LOW (ref 25.1–34.0)
MCHC: 30.4 g/dL — ABNORMAL LOW (ref 31.5–36.0)
MCV: 77.5 fL — ABNORMAL LOW (ref 79.5–101.0)
MONO#: 0.2 10*3/uL (ref 0.1–0.9)
MONO%: 6.4 % (ref 0.0–14.0)
NEUT%: 46.6 % (ref 38.4–76.8)
NEUTROS ABS: 1.5 10*3/uL (ref 1.5–6.5)
PLATELETS: 236 10*3/uL (ref 145–400)
RBC: 4.25 10*6/uL (ref 3.70–5.45)
RDW: 17.5 % — ABNORMAL HIGH (ref 11.2–14.5)
WBC: 3.3 10*3/uL — AB (ref 3.9–10.3)
lymph#: 1.5 10*3/uL (ref 0.9–3.3)

## 2013-09-29 LAB — PROTIME-INR
INR: 2.2 (ref 2.00–3.50)
Protime: 26.4 Seconds — ABNORMAL HIGH (ref 10.6–13.4)

## 2013-09-29 LAB — POCT INR: INR: 2.2

## 2013-09-29 NOTE — Progress Notes (Signed)
INR within goal today. Hg/Hct: 10/32.9, Pltc: 236 No problems or concerns regarding anticoagulation. Pt may have missed 1 dose about 2 weeks ago.  She was not sure if she took it or not. No changes in diet or medications. No unusual bruising or bleeding. No s/s of clotting noted. Continue 15 mg daily except for 10 mg Mondays, Wednesdays, and Fridays. Recheck INR on 11/24/13; lab at 9:00am and coumadin clinic at 9:15am.

## 2013-09-29 NOTE — Patient Instructions (Signed)
Continue 15 mg daily except for 10 mg Mondays, Wednesdays, and Fridays. Recheck INR on 11/24/13; lab at 9:00am and coumadin clinic at 9:15am.

## 2013-09-29 NOTE — Telephone Encounter (Signed)
, °

## 2013-09-29 NOTE — Progress Notes (Addendum)
ID: Heather Vargas  DOB: 1980-05-21  MR#: [id number]  CSN#: [id number]   Interval History:   Heather is here today for f/u of a pulmonary embolus that she had while she was pregnant.  This happened in June, 2013 and the patient was pregnant at the time.  She actually discovered she was pregnant when she went to the emergency room with a PE.  A VQ scan and CTA were concerning for an acute pulmonary embolism.  She was placed on Lovenox, and then Heparin as her delivery date neared for her son.  She delivered a healthy baby boy, Heather Vargas, on March 24, 2012.  She was then restarted Lovenox to Coumadin.  She has been taking Coumadin ever since and has noted no difficulty.  No easy bruising/bleeding and she continues to follow with the coumadin clinic.  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  She does have occasional small breast hematomas, and her main question today is why she remains on the Coumadin.  Otherwise, a 10 point ROS is negative.      Objective:  Filed Vitals:   09/29/13 0931  BP: 133/83  Pulse: 67  Temp: 98.4 F (36.9 C)  Resp: 18    BMI: Body mass index is 49.22 kg/(m^2).   ECOG FS: 1  Physical Exam: GENERAL: Patient is a well appearing female in no acute distress HEENT:  Sclerae anicteric.  Oropharynx clear and moist. No ulcerations or evidence of oropharyngeal candidiasis. Neck is supple.  NODES:  No cervical, supraclavicular, or axillary lymphadenopathy palpated.  LUNGS:  Clear to auscultation bilaterally.  No wheezes or rhonchi. HEART:  Regular rate and rhythm. No murmur appreciated. ABDOMEN:  Soft, nontender.  Positive, normoactive bowel sounds. No organomegaly palpated. MSK:  No focal spinal tenderness to palpation. Full range of motion bilaterally in the upper extremities. EXTREMITIES:  No peripheral edema.   SKIN:  Clear with no obvious rashes or skin changes. No nail dyscrasia. NEURO:  Nonfocal. Well oriented.  Appropriate  affect.  Lab Results:      Chemistry      Component Value Date/Time   NA 139 09/29/2013 0835   NA 134* 05/22/2012 0900   K 3.9 09/29/2013 0835   K 3.9 05/22/2012 0900   CL 101 05/22/2012 0900   CL 109* 02/17/2012 0902   CO2 23 09/29/2013 0835   CO2 25 05/22/2012 0900   BUN 12.2 09/29/2013 0835   BUN 12 05/22/2012 0900   CREATININE 1.0 09/29/2013 0835   CREATININE 0.91 05/22/2012 0900   CREATININE 0.67 02/19/2012 1736      Component Value Date/Time   CALCIUM 9.2 09/29/2013 0835   CALCIUM 9.7 05/22/2012 0900   ALKPHOS 70 09/29/2013 0835   ALKPHOS 159* 03/23/2012 2100   AST 14 09/29/2013 0835   AST 15 03/23/2012 2100   ALT 13 09/29/2013 0835   ALT 7 03/23/2012 2100   BILITOT 0.46 09/29/2013 0835   BILITOT 0.4 03/23/2012 2100       Lab Results  Component Value Date   WBC 3.3* 09/29/2013   HGB 10.0* 09/29/2013   HCT 32.9* 09/29/2013   MCV 77.5* 09/29/2013   PLT 236 09/29/2013   NEUTROABS 1.5 09/29/2013       Assessment: 33 year old female with   #1 History of pulmonary embolism in 2013, on Coumadin.   Plan:  Heather is doing well today.  She continues on Coumadin and is tolerating it well.  She will  continue on Coumadin for the time being.  We will repeat a hypercoagulable panel in the next week and she will f/u with Dr. Pamelia Hoit a week after that.  At that point they will discuss whether or not to continue Coumadin.    I spent 25 minutes counseling the patient face to face.  The total time spent in the appointment was 30 minutes.  Illa Level, NP Medical Oncology [facility] [phone] 09/30/2013   Attending Note  I personally saw and examined Heather Vargas. The plan of care was discussed with her. I agree with the assessment and plan as documented above.  we will Recheck hypercoagulability panel. If that is negative then plan to discontinue anticoagulation Signed Sabas Sous, MD

## 2013-09-30 ENCOUNTER — Encounter: Payer: Self-pay | Admitting: Adult Health

## 2013-11-23 ENCOUNTER — Other Ambulatory Visit: Payer: Self-pay | Admitting: Obstetrics and Gynecology

## 2013-11-24 ENCOUNTER — Ambulatory Visit: Payer: BC Managed Care – PPO

## 2013-11-24 ENCOUNTER — Other Ambulatory Visit: Payer: BC Managed Care – PPO

## 2013-11-24 ENCOUNTER — Telehealth: Payer: Self-pay

## 2013-11-24 NOTE — Telephone Encounter (Signed)
FTKA for Coumadin Clinic today. Called and left voicemail instructing patient to call Coumadin Clinic to reschedule appointment.

## 2013-12-06 ENCOUNTER — Encounter: Payer: Self-pay | Admitting: Adult Health

## 2013-12-17 ENCOUNTER — Encounter: Payer: Self-pay | Admitting: *Deleted

## 2013-12-17 NOTE — Progress Notes (Unsigned)
Received a call from [facility] concerning the protocol prior to surgery for pt on 01/06/14. Pt is on Warfarin, but missed her last appt for the Coumadin Clinic on Oct. 21st. Pt will be having a hysteroscopy. I called the anticoagulant clinic and they said the physician makes the protocol and then they follow the instructions based on the physician's orders concerning this matter. I have given Thayer OhmChris the info from [facility] to f/u once Dr. Pamelia HoitGudena orders the protocol. Message to be forwarded to Dr.Gudena.

## 2013-12-20 ENCOUNTER — Encounter: Payer: Self-pay | Admitting: Pharmacist

## 2013-12-20 ENCOUNTER — Telehealth: Payer: Self-pay | Admitting: *Deleted

## 2013-12-20 NOTE — Progress Notes (Signed)
Ms. Heather Vargas is scheduled for procedure on 01/06/14. Pt has not had INR drawn since 09/29/13. Will contact patient to set up INR check for the next 1-2 weeks.   Coumadin dosing around procedure per Dr. Pamelia HoitGudena is as follows:  Stop coumadin 3 days prior to hysteroscopy (01/03/14) No Need for lovenox bridging Resume coumadin day after hysteroscopy (01/07/14).   Thank you,  Christell Faithhris Trinidad Petron, Pharm.D., BCOP

## 2013-12-20 NOTE — Telephone Encounter (Signed)
[facility] with [facility] via VM for Adrianne to inform her of the instructions concerning pt's Coumadin before surgery. Left a detailed message about this to VM per Dr. Earmon PhoenixGudena's protocol. I also instructed if this office has any questions to call this nurse @ [phone]. Message to be forwarded to Dr. Pamelia HoitGudena.

## 2013-12-22 ENCOUNTER — Other Ambulatory Visit: Payer: Self-pay | Admitting: Obstetrics and Gynecology

## 2013-12-24 ENCOUNTER — Other Ambulatory Visit: Payer: Self-pay | Admitting: *Deleted

## 2013-12-24 DIAGNOSIS — Z86718 Personal history of other venous thrombosis and embolism: Secondary | ICD-10-CM

## 2013-12-27 ENCOUNTER — Ambulatory Visit (HOSPITAL_BASED_OUTPATIENT_CLINIC_OR_DEPARTMENT_OTHER): Payer: BC Managed Care – PPO | Admitting: Pharmacist

## 2013-12-27 ENCOUNTER — Other Ambulatory Visit (HOSPITAL_BASED_OUTPATIENT_CLINIC_OR_DEPARTMENT_OTHER): Payer: BC Managed Care – PPO

## 2013-12-27 ENCOUNTER — Other Ambulatory Visit: Payer: Self-pay | Admitting: Pharmacist

## 2013-12-27 DIAGNOSIS — Z86718 Personal history of other venous thrombosis and embolism: Secondary | ICD-10-CM

## 2013-12-27 DIAGNOSIS — I2699 Other pulmonary embolism without acute cor pulmonale: Secondary | ICD-10-CM

## 2013-12-27 LAB — CBC WITH DIFFERENTIAL/PLATELET
BASO%: 0.6 % (ref 0.0–2.0)
Basophils Absolute: 0 10*3/uL (ref 0.0–0.1)
EOS%: 1.1 % (ref 0.0–7.0)
Eosinophils Absolute: 0 10*3/uL (ref 0.0–0.5)
HCT: 32.6 % — ABNORMAL LOW (ref 34.8–46.6)
HGB: 9.6 g/dL — ABNORMAL LOW (ref 11.6–15.9)
LYMPH#: 1.9 10*3/uL (ref 0.9–3.3)
LYMPH%: 53.3 % — ABNORMAL HIGH (ref 14.0–49.7)
MCH: 23.4 pg — ABNORMAL LOW (ref 25.1–34.0)
MCHC: 29.4 g/dL — ABNORMAL LOW (ref 31.5–36.0)
MCV: 79.3 fL — ABNORMAL LOW (ref 79.5–101.0)
MONO#: 0.2 10*3/uL (ref 0.1–0.9)
MONO%: 4.7 % (ref 0.0–14.0)
NEUT#: 1.5 10*3/uL (ref 1.5–6.5)
NEUT%: 40.3 % (ref 38.4–76.8)
Platelets: 261 10*3/uL (ref 145–400)
RBC: 4.11 10*6/uL (ref 3.70–5.45)
RDW: 15.9 % — ABNORMAL HIGH (ref 11.2–14.5)
WBC: 3.6 10*3/uL — AB (ref 3.9–10.3)

## 2013-12-27 LAB — COMPREHENSIVE METABOLIC PANEL (CC13)
ALT: 12 U/L (ref 0–55)
AST: 12 U/L (ref 5–34)
Albumin: 3.5 g/dL (ref 3.5–5.0)
Alkaline Phosphatase: 90 U/L (ref 40–150)
Anion Gap: 8 mEq/L (ref 3–11)
BILIRUBIN TOTAL: 0.33 mg/dL (ref 0.20–1.20)
BUN: 12 mg/dL (ref 7.0–26.0)
CALCIUM: 9.2 mg/dL (ref 8.4–10.4)
CHLORIDE: 108 meq/L (ref 98–109)
CO2: 24 meq/L (ref 22–29)
Creatinine: 0.9 mg/dL (ref 0.6–1.1)
Glucose: 100 mg/dl (ref 70–140)
Potassium: 4.2 mEq/L (ref 3.5–5.1)
Sodium: 140 mEq/L (ref 136–145)
Total Protein: 7.3 g/dL (ref 6.4–8.3)

## 2013-12-27 LAB — PROTIME-INR
INR: 1.8 — AB (ref 2.00–3.50)
PROTIME: 21.6 s — AB (ref 10.6–13.4)

## 2013-12-27 LAB — POCT INR: INR: 1.6

## 2013-12-27 NOTE — Progress Notes (Signed)
INR below goal today.  H/H=9.6/32.6.  No changes in meds.  Heather Vargas has just completed a course of Augmentin.  No bleeding/unusual bruising.  Last coumadin clinic appt was 09/29/13.  Heather Vargas is scheduled for a D&C with ablation on 01/06/14.  She has already aware that she will hold coumadin beginning on 11/30 and resume on 12/4 at usual dose of 10mg  MWF and 15mg  other days.  For today, she will take an extra 5mg  =15mg  of coumadin then resume usual dose of 10mg  MWF and 15mg  other days, which INR has been stable on since 05/2013.  Will check PT/INR about 10 days after she resumes coumadin after Heather Vargas procedure.  Note:  Heather Vargas had been waiting for 1 hour b/c there were no PT/[place]R orders in [place].  Orders entered.

## 2013-12-31 LAB — HYPERCOAGULABLE PANEL, COMPREHENSIVE
ANTICARDIOLIPIN IGG: 9 GPL U/mL (ref <23)
ANTICARDIOLIPIN IGM: 12 [MPL'U]/mL — AB (ref <11)
AntiThromb III Func: 117 % (ref 76–126)
Anticardiolipin IgA: 7 APL U/mL (ref <22)
BETA 2 GLYCO I IGG: 3 G Units (ref <20)
Beta-2-Glycoprotein I IgA: 7 A Units (ref <20)
Beta-2-Glycoprotein I IgM: 7 M Units (ref <20)
DRVVT: 34.6 s (ref <42.9)
Lupus Anticoagulant: NOT DETECTED
PROTEIN S TOTAL: 60 % (ref 60–150)
PTT LA: 37.9 s (ref 28.0–43.0)
Protein C Activity: 81 % (ref 75–133)
Protein C, Total: 71 % — ABNORMAL LOW (ref 72–160)
Protein S Activity: 43 % — ABNORMAL LOW (ref 69–129)

## 2014-01-06 ENCOUNTER — Ambulatory Visit (HOSPITAL_COMMUNITY): Payer: BC Managed Care – PPO | Admitting: Anesthesiology

## 2014-01-06 ENCOUNTER — Ambulatory Visit (HOSPITAL_COMMUNITY)
Admission: RE | Admit: 2014-01-06 | Discharge: 2014-01-06 | Disposition: A | Discharge status: Home / Self Care | Payer: BC Managed Care – PPO | Source: Ambulatory Visit | Attending: Obstetrics and Gynecology | Admitting: Obstetrics and Gynecology

## 2014-01-06 ENCOUNTER — Encounter (HOSPITAL_COMMUNITY)
Admission: RE | Disposition: A | Discharge status: Home / Self Care | Payer: Self-pay | Source: Ambulatory Visit | Attending: Obstetrics and Gynecology

## 2014-01-06 ENCOUNTER — Encounter (HOSPITAL_COMMUNITY): Payer: Self-pay | Admitting: Anesthesiology

## 2014-01-06 DIAGNOSIS — N920 Excessive and frequent menstruation with regular cycle: Secondary | ICD-10-CM | POA: Diagnosis not present

## 2014-01-06 DIAGNOSIS — Z8619 Personal history of other infectious and parasitic diseases: Secondary | ICD-10-CM | POA: Diagnosis not present

## 2014-01-06 DIAGNOSIS — Z833 Family history of diabetes mellitus: Secondary | ICD-10-CM | POA: Insufficient documentation

## 2014-01-06 DIAGNOSIS — E559 Vitamin D deficiency, unspecified: Secondary | ICD-10-CM | POA: Insufficient documentation

## 2014-01-06 DIAGNOSIS — Z8659 Personal history of other mental and behavioral disorders: Secondary | ICD-10-CM | POA: Diagnosis not present

## 2014-01-06 DIAGNOSIS — Z8042 Family history of malignant neoplasm of prostate: Secondary | ICD-10-CM | POA: Diagnosis not present

## 2014-01-06 DIAGNOSIS — Z823 Family history of stroke: Secondary | ICD-10-CM | POA: Insufficient documentation

## 2014-01-06 DIAGNOSIS — I10 Essential (primary) hypertension: Secondary | ICD-10-CM | POA: Diagnosis not present

## 2014-01-06 DIAGNOSIS — Z86711 Personal history of pulmonary embolism: Secondary | ICD-10-CM | POA: Diagnosis not present

## 2014-01-06 DIAGNOSIS — Z800 Family history of malignant neoplasm of digestive organs: Secondary | ICD-10-CM | POA: Diagnosis not present

## 2014-01-06 DIAGNOSIS — Z8669 Personal history of other diseases of the nervous system and sense organs: Secondary | ICD-10-CM | POA: Insufficient documentation

## 2014-01-06 DIAGNOSIS — Z6841 Body Mass Index (BMI) 40.0 and over, adult: Secondary | ICD-10-CM | POA: Diagnosis not present

## 2014-01-06 DIAGNOSIS — Z8249 Family history of ischemic heart disease and other diseases of the circulatory system: Secondary | ICD-10-CM | POA: Insufficient documentation

## 2014-01-06 DIAGNOSIS — D649 Anemia, unspecified: Secondary | ICD-10-CM | POA: Diagnosis not present

## 2014-01-06 DIAGNOSIS — Z820 Family history of epilepsy and other diseases of the nervous system: Secondary | ICD-10-CM | POA: Diagnosis not present

## 2014-01-06 DIAGNOSIS — Z86718 Personal history of other venous thrombosis and embolism: Secondary | ICD-10-CM | POA: Insufficient documentation

## 2014-01-06 DIAGNOSIS — N926 Irregular menstruation, unspecified: Secondary | ICD-10-CM | POA: Diagnosis present

## 2014-01-06 DIAGNOSIS — Z8349 Family history of other endocrine, nutritional and metabolic diseases: Secondary | ICD-10-CM | POA: Insufficient documentation

## 2014-01-06 DIAGNOSIS — Z8543 Personal history of malignant neoplasm of ovary: Secondary | ICD-10-CM | POA: Diagnosis not present

## 2014-01-06 HISTORY — PX: DILITATION & CURRETTAGE/HYSTROSCOPY WITH THERMACHOICE ABLATION: SHX5569

## 2014-01-06 LAB — PREGNANCY, URINE: Preg Test, Ur: NEGATIVE

## 2014-01-06 SURGERY — DILATATION & CURETTAGE/HYSTEROSCOPY WITH THERMACHOICE ABLATION
Anesthesia: General | Site: Uterus

## 2014-01-06 MED ORDER — FENTANYL CITRATE 0.05 MG/ML IJ SOLN
25.0000 ug | INTRAMUSCULAR | Status: DC | PRN
  Administered 2014-01-06 (×2): 50 ug via INTRAVENOUS

## 2014-01-06 MED ORDER — LIDOCAINE HCL (CARDIAC) 20 MG/ML IV SOLN
INTRAVENOUS | Status: AC
  Filled 2014-01-06: qty 5

## 2014-01-06 MED ORDER — ONDANSETRON HCL 4 MG/2ML IJ SOLN
INTRAMUSCULAR | Status: DC | PRN
  Administered 2014-01-06: 4 mg via INTRAVENOUS

## 2014-01-06 MED ORDER — SCOPOLAMINE 1 MG/3DAYS TD PT72
MEDICATED_PATCH | TRANSDERMAL | Status: AC
  Filled 2014-01-06: qty 1

## 2014-01-06 MED ORDER — SCOPOLAMINE 1 MG/3DAYS TD PT72
1.0000 | MEDICATED_PATCH | Freq: Once | TRANSDERMAL | Status: DC
  Administered 2014-01-06: 1.5 mg via TRANSDERMAL

## 2014-01-06 MED ORDER — MEPERIDINE HCL 25 MG/ML IJ SOLN: 6.2500 mg | INTRAMUSCULAR | Status: DC | PRN

## 2014-01-06 MED ORDER — IBUPROFEN 600 MG PO TABS: 600.0000 mg | ORAL_TABLET | Freq: Four times a day (QID) | ORAL | Status: DC | PRN

## 2014-01-06 MED ORDER — LIDOCAINE HCL 2 % IJ SOLN
INTRAMUSCULAR | Status: AC
  Filled 2014-01-06: qty 20

## 2014-01-06 MED ORDER — FENTANYL CITRATE 0.05 MG/ML IJ SOLN
INTRAMUSCULAR | Status: AC
  Administered 2014-01-06: 50 ug via INTRAVENOUS
  Filled 2014-01-06: qty 2

## 2014-01-06 MED ORDER — METOCLOPRAMIDE HCL 5 MG/ML IJ SOLN: 10.0000 mg | Freq: Once | INTRAMUSCULAR | Status: DC | PRN

## 2014-01-06 MED ORDER — PROPOFOL 10 MG/ML IV EMUL
INTRAVENOUS | Status: AC
Start: 2014-01-06 — End: 2014-01-06
  Filled 2014-01-06: qty 20

## 2014-01-06 MED ORDER — SODIUM CHLORIDE 0.9 % IR SOLN
Status: DC | PRN
  Administered 2014-01-06: 3000 mL

## 2014-01-06 MED ORDER — KETOROLAC TROMETHAMINE 30 MG/ML IJ SOLN
INTRAMUSCULAR | Status: AC
  Filled 2014-01-06: qty 1

## 2014-01-06 MED ORDER — HYDROCODONE-ACETAMINOPHEN 5-325 MG PO TABS: 1.0000 | ORAL_TABLET | Freq: Four times a day (QID) | ORAL | Status: DC | PRN

## 2014-01-06 MED ORDER — MIDAZOLAM HCL 2 MG/2ML IJ SOLN
INTRAMUSCULAR | Status: DC | PRN
  Administered 2014-01-06: 2 mg via INTRAVENOUS

## 2014-01-06 MED ORDER — KETOROLAC TROMETHAMINE 30 MG/ML IJ SOLN: INTRAMUSCULAR | Status: DC | PRN

## 2014-01-06 MED ORDER — DEXAMETHASONE SODIUM PHOSPHATE 10 MG/ML IJ SOLN
INTRAMUSCULAR | Status: DC | PRN
  Administered 2014-01-06: 10 mg via INTRAVENOUS

## 2014-01-06 MED ORDER — MIDAZOLAM HCL 2 MG/2ML IJ SOLN
INTRAMUSCULAR | Status: AC
  Filled 2014-01-06: qty 2

## 2014-01-06 MED ORDER — KETOROLAC TROMETHAMINE 30 MG/ML IJ SOLN
INTRAMUSCULAR | Status: DC | PRN
Start: 2014-01-06 — End: 2014-01-06
  Administered 2014-01-06: 30 mg via INTRAVENOUS

## 2014-01-06 MED ORDER — PROPOFOL 10 MG/ML IV BOLUS
INTRAVENOUS | Status: DC | PRN
  Administered 2014-01-06: 250 mg via INTRAVENOUS

## 2014-01-06 MED ORDER — LIDOCAINE HCL 2 % IJ SOLN
INTRAMUSCULAR | Status: DC | PRN
  Administered 2014-01-06: 20 mL

## 2014-01-06 MED ORDER — LIDOCAINE HCL (CARDIAC) 20 MG/ML IV SOLN
INTRAVENOUS | Status: DC | PRN
  Administered 2014-01-06: 80 mg via INTRAVENOUS

## 2014-01-06 MED ORDER — DEXTROSE 5 % IV SOLN
INTRAVENOUS | Status: DC | PRN
  Administered 2014-01-06: 1000 mL

## 2014-01-06 MED ORDER — PROPOFOL 10 MG/ML IV EMUL
INTRAVENOUS | Status: AC
  Filled 2014-01-06: qty 20

## 2014-01-06 MED ORDER — DEXAMETHASONE SODIUM PHOSPHATE 10 MG/ML IJ SOLN
INTRAMUSCULAR | Status: AC
Start: 2014-01-06 — End: 2014-01-06
  Filled 2014-01-06: qty 1

## 2014-01-06 MED ORDER — ONDANSETRON HCL 4 MG/2ML IJ SOLN
INTRAMUSCULAR | Status: AC
  Filled 2014-01-06: qty 2

## 2014-01-06 MED ORDER — LACTATED RINGERS IV SOLN
INTRAVENOUS | Status: DC
Start: 2014-01-06 — End: 2014-01-06
  Administered 2014-01-06 (×2): via INTRAVENOUS

## 2014-01-06 MED ORDER — DOXYCYCLINE HYCLATE 50 MG PO CAPS: 100.0000 mg | ORAL_CAPSULE | Freq: Two times a day (BID) | ORAL | Status: AC

## 2014-01-06 MED ORDER — FENTANYL CITRATE 0.05 MG/ML IJ SOLN
INTRAMUSCULAR | Status: DC | PRN
  Administered 2014-01-06 (×2): 50 ug via INTRAVENOUS

## 2014-01-06 MED ORDER — FENTANYL CITRATE 0.05 MG/ML IJ SOLN
INTRAMUSCULAR | Status: AC
  Filled 2014-01-06: qty 2

## 2014-01-06 SURGICAL SUPPLY — 17 items
CANISTER SUCT 3000ML (MISCELLANEOUS) ×3 IMPLANT
CATH ROBINSON RED A/P 16FR (CATHETERS) ×3 IMPLANT
CATH THERMACHOICE III (CATHETERS) ×3 IMPLANT
CLOTH BEACON ORANGE TIMEOUT ST (SAFETY) ×3 IMPLANT
CONTAINER PREFILL 10% NBF 60ML (FORM) ×6 IMPLANT
GLOVE BIO SURGEON STRL SZ 6.5 (GLOVE) ×2 IMPLANT
GLOVE BIO SURGEONS STRL SZ 6.5 (GLOVE) ×1
GLOVE BIOGEL PI IND STRL 7.0 (GLOVE) ×3 IMPLANT
GLOVE BIOGEL PI INDICATOR 7.0 (GLOVE) ×6
GLOVE SURG SS PI 7.0 STRL IVOR (GLOVE) ×3 IMPLANT
GOWN STRL REUS W/TWL LRG LVL3 (GOWN DISPOSABLE) ×6 IMPLANT
HOVERMATT HALF SINGLE USE (PATIENT TRANSFER) ×3 IMPLANT
PACK VAGINAL MINOR WOMEN LF (CUSTOM PROCEDURE TRAY) ×3 IMPLANT
PAD OB MATERNITY 4.3X12.25 (PERSONAL CARE ITEMS) ×3 IMPLANT
SYR 20CC LL (SYRINGE) ×3 IMPLANT
TOWEL OR 17X24 6PK STRL BLUE (TOWEL DISPOSABLE) ×6 IMPLANT
WATER STERILE IRR 1000ML POUR (IV SOLUTION) ×3 IMPLANT

## 2014-01-06 NOTE — Discharge Instructions (Signed)
DISCHARGE INSTRUCTIONS: HYSTEROSCOPY / ENDOMETRIAL ABLATION The following instructions have been prepared to help you care for yourself upon your return home.  May take Ibuprofen after 8:30 pm as needed for cramps.  May remove Scop patch on or before 01/08/2014  Personal hygiene:  Use sanitary pads for vaginal drainage, not tampons.  Shower the day after your procedure.  NO tub baths, pools or Jacuzzis for 2-3 weeks.  Wipe front to back after using the bathroom.  Activity and limitations:  Do NOT drive or operate any equipment for 24 hours. The effects of anesthesia are still present and drowsiness may result.  Do NOT rest in bed all day.  Walking is encouraged.  Walk up and down stairs slowly.  You may resume your normal activity in one to two days or as indicated by your physician.  Sexual activity: NO intercourse for at least 2 weeks after the procedure, or as indicated by your Doctor.  Diet: Eat a light meal as desired this evening. You may resume your usual diet tomorrow.  Return to Work: You may resume your work activities in one to two days or as indicated by Therapist, sportsyour Doctor.  What to expect after your surgery: Expect to have vaginal bleeding/discharge for 2-3 days and spotting for up to 10 days. It is not unusual to have soreness for up to 1-2 weeks. You may have a slight burning sensation when you urinate for the first day. Mild cramps may continue for a couple of days. You may have a regular period in 2-6 weeks.  Call your doctor for any of the following:  Excessive vaginal bleeding or clotting, saturating and changing one pad every hour.  Inability to urinate 6 hours after discharge from hospital.  Pain not relieved by pain medication.  Fever of 100.4 F or greater.  Unusual vaginal discharge or odor.  Return to office _________________Call for an appointment ___________________ Patients signature: ______________________ Nurses signature  ________________________  Post Anesthesia Care Unit [phone] Hysteroscopy is a procedure used for looking inside the womb (uterus). It may be done for various reasons, including:  To evaluate abnormal bleeding, fibroid (benign, noncancerous) tumors, polyps, scar tissue (adhesions), and possibly cancer of the uterus.  To look for lumps (tumors) and other uterine growths.  To look for causes of why a woman cannot get pregnant (infertility), causes of recurrent loss of pregnancy (miscarriages), or a lost intrauterine device (IUD).  To perform a sterilization by blocking the fallopian tubes from inside the uterus. In this procedure, a thin, flexible tube with a tiny light and camera on the end of it (hysteroscope) is used to look inside the uterus. A hysteroscopy should be done right after a menstrual period to be sure you are not pregnant. LET [facility]OUR [facility]ARE PROVIDER KNOW ABOUT:   Any allergies you have.  All medicines you are taking, including vitamins, herbs, eye drops, creams, and over-the-counter medicines.  Previous problems you or members of your family have had with the use of anesthetics.  Any blood disorders you have.  Previous surgeries you have had.  Medical conditions you have. RISKS AND COMPLICATIONS  Generally, this is a safe procedure. However, as with any procedure, complications can occur. Possible complications include:  Putting a hole in the uterus.  Excessive bleeding.  Infection.  Damage to the cervix.  Injury to other organs.  Allergic reaction to medicines.  Too much fluid used in the uterus for the procedure. BEFORE THE PROCEDURE   Ask your health care  provider about changing or stopping any regular medicines.  Do not take aspirin or blood thinners for 1 week before the procedure, or as directed by your health care provider. These can cause bleeding.  If you smoke, do not smoke for 2 weeks before the procedure.  In some cases, a  medicine is placed in the cervix the day before the procedure. This medicine makes the cervix have a larger opening (dilate). This makes it easier for the instrument to be inserted into the uterus during the procedure.  Do not eat or drink anything for at least 8 hours before the surgery.  Arrange for someone to take you home after the procedure. PROCEDURE   You may be given a medicine to relax you (sedative). You may also be given one of the following:  A medicine that numbs the area around the cervix (local anesthetic).  A medicine that makes you sleep through the procedure (general anesthetic).  The hysteroscope is inserted through the vagina into the uterus. The camera on the hysteroscope sends a picture to a TV screen. This gives the surgeon a good view inside the uterus.  During the procedure, air or a liquid is put into the uterus, which allows the surgeon to see better.  Sometimes, tissue is gently scraped from inside the uterus. These tissue samples are sent to a lab for testing. AFTER THE PROCEDURE   If you had a general anesthetic, you may be groggy for a couple hours after the procedure.  If you had a local anesthetic, you will be able to go home as soon as you are stable and feel ready.  You may have some cramping. This normally lasts for a couple days.  You may have bleeding, which varies from light spotting for a few days to menstrual-like bleeding for 3-7 days. This is normal.  If your test results are not back during the visit, make an appointment with your health care provider to find out the results. Document Released: 04/29/2000 Document Revised: 11/11/2012 Document Reviewed: 08/20/2012 [facility] Patient Information 2015 [employer] [place]. This information is not intended to replace advice given to you by your health care provider. Make sure you discuss any questions you have with your health care provider.

## 2014-01-06 NOTE — Interval H&P Note (Signed)
History and Physical Interval Note:  01/06/2014 1:38 PM  Heather Vargas  has presented today for surgery, with the diagnosis of Irregular Menstruation  The various methods of treatment have been discussed with the patient and family. After consideration of risks, benefits and other options for treatment, the patient has consented to  Procedure(s): DILATATION & CURETTAGE/HYSTEROSCOPY WITH THERMACHOICE ABLATION (N/A) as a surgical intervention .  The patient's history has been reviewed, patient examined, no change in status, stable for surgery.  I have reviewed the patient's chart and labs.  Questions were answered to the patient's satisfaction.     [facility] HospitalDILLARD,Ervan Heber A

## 2014-01-06 NOTE — Transfer of Care (Signed)
Immediate Anesthesia Transfer of Care Note  Patient: Heather Vargas  Procedure(s) Performed: Procedure(s): DILATATION & CURETTAGE/HYSTEROSCOPY WITH THERMACHOICE ABLATION (N/A)  Patient Location: PACU  Anesthesia Type:General  Level of Consciousness: awake, alert  and oriented  Airway & Oxygen Therapy: Patient Spontanous Breathing and Patient connected to nasal cannula oxygen  Post-op Assessment: Report given to PACU RN, Post -op Vital signs reviewed and stable and Patient moving all extremities  Post vital signs: Reviewed and stable  Complications: No apparent anesthesia complications

## 2014-01-06 NOTE — Op Note (Signed)
Pre op DX: Irregular Menstruation   Post Op DX: menorrhagia   PHYSICIAN : Anecia Nusbaum   ASSISTANTS: none   ANESTHESIA:   LMA and paracervical block  ESTIMATED BLOOD LOSS: minimal  LOCAL MEDICATIONS USED:  LIDOCAINE 20CC  SPECIMEN:  Source of Specimen:  endometrial curettings  DISPOSITION OF SPECIMEN:  PATHOLOGY  COUNTS Correct:  YES    DICTATION #: The patient was taken to the operating room and prepped and draped in a normal sterile fashion. An in out catheter was used to drain the bladder.   A bivalve speculum was placed into the vagina and anterior lip of the cervix was grasped with a single-tooth tenaculum.  20 cc of 1% lidocaine was used for cervical block.  the cervix was then dilated with Shawnie PonsPratt dilators up to 21. The hysteroscope was placed into the uterine cavity. The  entire uterus and both ostia were visualized.   Hyseroscope was then removed from the uterus. A sharp curettage was then done with a curette and endometrial curettings were obtained. The endometrial curettings were sent to pathology. thermachoice ablation was done per protocol.   Again the hysteroscope was placed into the uterine cavity.   Both ostia were again visualized there were no polyps or submucosal fibroids or endometrial masses seen. The tenaculum was removed from the cervix and hemostasis was noted.   PLAN OF CARE: discharge to home  PATIENT DISPOSITION:  PACU - hemodynamically stable.

## 2014-01-06 NOTE — Anesthesia Preprocedure Evaluation (Signed)
Anesthesia Evaluation  Patient identified by MRN, date of birth, ID band Patient awake    Reviewed: Allergy & Precautions, H&P , NPO status , Patient's Chart, lab work & pertinent test results  Airway Mallampati: III  TM Distance: >3 FB Neck ROM: Full    Dental no notable dental hx. 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 on lovenox during pregnancy and most recently on Coumadin   Neuro/Psych  Headaches, PSYCHIATRIC DISORDERS Depression    GI/Hepatic negative GI ROS, Neg liver ROS,   Endo/Other  Morbid obesity  Renal/GU negative Renal ROS     Musculoskeletal negative musculoskeletal ROS (+)   Abdominal (+) + obese,   Peds  Hematology  (+) anemia , Recently anticoagulated last dose of Coumadin on 11/29   Anesthesia Other Findings   Reproductive/Obstetrics Irregular menses                             Anesthesia Physical Anesthesia Plan  ASA: III  Anesthesia Plan: General   Post-op Pain Management:    Induction: Intravenous  Airway Management Planned: LMA  Additional Equipment:   Intra-op Plan:   Post-operative Plan: Extubation in OR  Informed Consent: I have reviewed the patients History and Physical, chart, labs and discussed the procedure including the risks, benefits and alternatives for the proposed anesthesia with the patient or authorized representative who has indicated his/her understanding and acceptance.   Dental advisory given  Plan Discussed with: CRNA, Anesthesiologist and Surgeon  Anesthesia Plan Comments:         Anesthesia Quick Evaluation

## 2014-01-06 NOTE — Anesthesia Postprocedure Evaluation (Signed)
  Anesthesia Post-op Note  Patient: Heather Vargas  Procedure(s) Performed: Procedure(s): DILATATION & CURETTAGE/HYSTEROSCOPY WITH THERMACHOICE ABLATION (N/A)  Patient Location: PACU  Anesthesia Type:General  Level of Consciousness: awake, alert  and oriented  Airway and Oxygen Therapy: Patient Spontanous Breathing  Post-op Pain: none  Post-op Assessment: Post-op Vital signs reviewed, Patient's Cardiovascular Status Stable, Respiratory Function Stable, Patent Airway, No signs of Nausea or vomiting and Pain level controlled  Post-op Vital Signs: Reviewed and stable  Last Vitals:  Filed Vitals:   01/06/14 1530  BP: 137/56  Pulse: 87  Temp:   Resp: 18    Complications: No apparent anesthesia complications

## 2014-01-06 NOTE — H&P (Signed)
Heather Vargas is an 33 y.o. female. Presenting for D&C hysteroscopy for menorrrhagia with thermachoice.    Pertinent Gynecological History: Menses: irregular occurring approximately every 7 days with spotting approximately 2 days per month Bleeding: dysfunctional uterine bleeding Contraception: Essure DES exposure: unknown Blood transfusions: none Sexually transmitted diseases: no past history Previous GYN Procedures: DNC and Essure  Last mammogram: na Date: na Last pap: normal Date: 2014 OB History: G1, P1   Menstrual History: Menarche age: [age]  No LMP recorded.  Scheduled Meds: Continuous Infusions: PRN Meds:.       Past Medical History  Diagnosis Date  . H/O varicella   . H/O migraine   . Yeast infection   . Bacterial infection   . Trichomonas     as a teen  . Irregular menstrual cycle 2012  . Increased BMI 12/11/10  . Vitamin D deficiency 12/11/10  . History of PCOS 12/11/10  . Headache(784.0)     Migraines;was on Topamax last year;no longer taking  . PMS (premenstrual syndrome) 04/09/11    was Rx'd Prozac  . Anemia     during 1st pregnancy;iron supp taken  . Hypertension     on Labetolol 100mg  bid  . Pulmonary embolism 2013    on Lovenox 130mg  bid  . Morbidly obese 09/10/2011  . Pregnancy 09/25/2011  . NSVD (normal spontaneous vaginal delivery) 03/24/2012  . History of depression 09/10/2011    Prozac in the past  . History of polyhydramnios 09/10/2011    IOL for poly w/ G1 in 2002    Past Surgical History  Procedure Laterality Date  . No past surgeries    . Tooth extraction    . Tubal ligation Bilateral 05/28/2012    Procedure: ESSURE TUBAL STERILIZATION;  Surgeon: Michael LitterNaima A Omelia Marquart, MD;  Location: [facility];  Service: Gynecology;  Laterality: Bilateral;  . Hysteroscopy N/A 05/28/2012    Procedure: HYSTEROSCOPY;  Surgeon: Michael LitterNaima A Parker Sawatzky, MD;  Location: [facility];  Service: Gynecology;  Laterality: N/A;    Family History  Problem Relation Age of Onset  .  Hypertension Mother   . Hypertension Father   . Diabetes Maternal Grandmother   . Cancer Paternal Grandmother     Colon  . Cancer Paternal Uncle     Prostate x 3  . Seizures Son     Epilelpsy  . Stroke Maternal Grandmother   . Stroke Maternal Grandmother   . Thyroid disease Maternal Grandmother   . Thyroid disease Paternal Grandmother     Social History:  reports that she has never smoked. She has never used smokeless tobacco. She reports that she drinks alcohol. She reports that she does not use illicit drugs.  Allergies: No Known Allergies  No prescriptions prior to admission    ROS  There were no vitals taken for this visit. Physical Exam  165feet 2 inches, weight 279 lbs, 120/80,  Physical Examination: General appearance - alert, well appearing, and in no distress Mental status - alert, oriented to person, place, and time Eyes - pupils equal and reactive, extraocular eye movements intact Chest - clear to auscultation, no wheezes, rales or rhonchi, symmetric air entry Heart - normal rate and regular rhythm Abdomen - soft, nontender, nondistended, no masses or organomegaly Pelvic - normal external genitalia, vulva, vagina, cervix, uterus and adnexa Neurological - alert, oriented, normal speech, no focal findings or movement disorder noted Musculoskeletal - no joint tenderness, deformity or swelling Extremities - peripheral pulses normal, no pedal edema, no clubbing or  cyanosis Skin - normal coloration and turgor, no rashes, no suspicious skin lesions noted   No results found for this or any previous visit (from the past 24 hour(s)).  No results found.  Assessment/Plan: Menorrhagia H/o DVT For D&C Hysteroscopy.  R&B reviewed  Taro Hidrogo A 01/06/2014, 11:36 AM

## 2014-01-07 ENCOUNTER — Encounter (HOSPITAL_COMMUNITY): Payer: Self-pay | Admitting: Obstetrics and Gynecology

## 2014-01-20 ENCOUNTER — Ambulatory Visit (HOSPITAL_BASED_OUTPATIENT_CLINIC_OR_DEPARTMENT_OTHER): Payer: BC Managed Care – PPO | Admitting: Pharmacist

## 2014-01-20 ENCOUNTER — Other Ambulatory Visit (HOSPITAL_BASED_OUTPATIENT_CLINIC_OR_DEPARTMENT_OTHER): Payer: BC Managed Care – PPO

## 2014-01-20 DIAGNOSIS — I2699 Other pulmonary embolism without acute cor pulmonale: Secondary | ICD-10-CM

## 2014-01-20 DIAGNOSIS — Z86718 Personal history of other venous thrombosis and embolism: Secondary | ICD-10-CM

## 2014-01-20 LAB — PROTIME-INR
INR: 2.4 (ref 2.00–3.50)
Protime: 28.8 Seconds — ABNORMAL HIGH (ref 10.6–13.4)

## 2014-01-20 LAB — POCT INR: INR: 2.4

## 2014-01-20 MED ORDER — WARFARIN SODIUM 10 MG PO TABS: ORAL_TABLET | ORAL | Status: DC

## 2014-01-20 NOTE — Progress Notes (Signed)
INR at goal Pt is doing well after procedure in early December No issues to report other then some muscle aches in her legs No missed or extra doses No diet or medication changes (she completed antibiotic post procedure > 1 week ago) No unusual bleeding or bruising Ms. Heather Vargas is asking about her hypercoagulable panel that was drawn last month to see if she will be continuing coumadin or not. Message sent to provider/desk nurse inbox Plan for now: No changes Continue 15 mg daily except for 10 mg Mondays, Wednesdays, and Fridays.   Recheck INR on 03/01/14; lab at 8:00am and coumadin clinic at 8:15am.

## 2014-01-20 NOTE — Patient Instructions (Signed)
INR at goal No changes Continue 15 mg daily except for 10 mg Mondays, Wednesdays, and Fridays.   Recheck INR on 03/01/14; lab at 8:00am and coumadin clinic at 8:15am.

## 2014-02-17 ENCOUNTER — Encounter (HOSPITAL_COMMUNITY): Payer: Self-pay | Admitting: Obstetrics and Gynecology

## 2014-02-28 ENCOUNTER — Ambulatory Visit: Payer: BC Managed Care – PPO

## 2014-02-28 ENCOUNTER — Other Ambulatory Visit: Payer: BC Managed Care – PPO

## 2014-03-14 ENCOUNTER — Telehealth: Payer: Self-pay | Admitting: Pharmacist

## 2014-03-14 NOTE — Telephone Encounter (Signed)
Pt previous seen in the coumadin clinic now off of coumadin due to unremarkable hypercoag panel. Pt now on a daily aspirin. Pt called in this afternoon stating she has been having some pain in her lower leg when is going to bed. There is no swelling or redness. Pt was questioning if she should go back on coumadin. I instructed patient to continue the daily aspirin and she would not need to start coumadin unless it is determined that she has another clot.   Will forward message to Dr. Pamelia HoitGudena and nurse to see if pt should be set up for D-dimer. I instructed patient to go to ED if swelling occurs and pain worsens.    Thank you,  Christell Faithhris Cory Rama, PharmD, BCOP

## 2014-06-13 ENCOUNTER — Other Ambulatory Visit: Payer: Self-pay | Admitting: Obstetrics and Gynecology

## 2014-06-13 DIAGNOSIS — N6310 Unspecified lump in the right breast, unspecified quadrant: Principal | ICD-10-CM

## 2014-06-13 DIAGNOSIS — N6315 Unspecified lump in the right breast, overlapping quadrants: Secondary | ICD-10-CM

## 2014-06-15 ENCOUNTER — Ambulatory Visit
Admission: RE | Admit: 2014-06-15 | Discharge: 2014-06-15 | Disposition: A | Discharge status: Home / Self Care | Payer: BLUE CROSS/BLUE SHIELD | Source: Ambulatory Visit | Attending: Obstetrics and Gynecology | Admitting: Obstetrics and Gynecology

## 2014-06-15 DIAGNOSIS — N6310 Unspecified lump in the right breast, unspecified quadrant: Principal | ICD-10-CM

## 2014-06-15 DIAGNOSIS — N6315 Unspecified lump in the right breast, overlapping quadrants: Secondary | ICD-10-CM

## 2014-07-14 ENCOUNTER — Encounter (HOSPITAL_COMMUNITY): Payer: Self-pay | Admitting: Obstetrics and Gynecology

## 2015-06-14 ENCOUNTER — Other Ambulatory Visit: Payer: Self-pay | Admitting: Orthopaedic Surgery

## 2015-06-14 DIAGNOSIS — M545 Low back pain: Secondary | ICD-10-CM

## 2015-06-22 ENCOUNTER — Ambulatory Visit
Admission: RE | Admit: 2015-06-22 | Discharge: 2015-06-22 | Disposition: A | Discharge status: Home / Self Care | Payer: BLUE CROSS/BLUE SHIELD | Source: Ambulatory Visit | Attending: Orthopaedic Surgery | Admitting: Orthopaedic Surgery

## 2015-06-22 DIAGNOSIS — M545 Low back pain: Secondary | ICD-10-CM

## 2016-07-25 ENCOUNTER — Encounter (HOSPITAL_COMMUNITY): Payer: Self-pay | Admitting: Emergency Medicine

## 2016-07-25 ENCOUNTER — Emergency Department (HOSPITAL_COMMUNITY)
Admission: EM | Admit: 2016-07-25 | Discharge: 2016-07-25 | Disposition: A | Discharge status: Home / Self Care | Payer: BLUE CROSS/BLUE SHIELD | Attending: Emergency Medicine | Admitting: Emergency Medicine

## 2016-07-25 ENCOUNTER — Emergency Department (HOSPITAL_COMMUNITY): Payer: BLUE CROSS/BLUE SHIELD

## 2016-07-25 DIAGNOSIS — Y999 Unspecified external cause status: Secondary | ICD-10-CM | POA: Diagnosis not present

## 2016-07-25 DIAGNOSIS — I10 Essential (primary) hypertension: Secondary | ICD-10-CM | POA: Insufficient documentation

## 2016-07-25 DIAGNOSIS — M25512 Pain in left shoulder: Secondary | ICD-10-CM | POA: Insufficient documentation

## 2016-07-25 DIAGNOSIS — Z79899 Other long term (current) drug therapy: Secondary | ICD-10-CM | POA: Diagnosis not present

## 2016-07-25 DIAGNOSIS — Y939 Activity, unspecified: Secondary | ICD-10-CM | POA: Diagnosis not present

## 2016-07-25 DIAGNOSIS — S4992XA Unspecified injury of left shoulder and upper arm, initial encounter: Secondary | ICD-10-CM | POA: Diagnosis present

## 2016-07-25 DIAGNOSIS — Z7982 Long term (current) use of aspirin: Secondary | ICD-10-CM | POA: Insufficient documentation

## 2016-07-25 DIAGNOSIS — X509XXA Other and unspecified overexertion or strenuous movements or postures, initial encounter: Secondary | ICD-10-CM | POA: Insufficient documentation

## 2016-07-25 DIAGNOSIS — M25552 Pain in left hip: Secondary | ICD-10-CM | POA: Insufficient documentation

## 2016-07-25 DIAGNOSIS — Y929 Unspecified place or not applicable: Secondary | ICD-10-CM | POA: Insufficient documentation

## 2016-07-25 MED ORDER — CYCLOBENZAPRINE HCL 10 MG PO TABS: 10.0000 mg | ORAL_TABLET | Freq: Two times a day (BID) | ORAL | 0 refills | Status: DC | PRN

## 2016-07-25 NOTE — ED Provider Notes (Signed)
[facility]-EMERGENCY DEPT Provider Note   CSN: [id number] Arrival date & time: 07/25/16  7829  By signing my name below, I, Modena Jansky, attest that this documentation has been prepared under the direction and in the presence of non-physician practitioner, Frederik Pear, PA-C. Electronically Signed: Modena Jansky, Scribe. 07/25/2016. 10:14 AM.  History   Chief Complaint Chief Complaint  Patient presents with  . Shoulder Pain  . Hip Pain  . Leg Pain   The history is provided by the patient. No language interpreter was used.   HPI Comments: Heather Vargas is a 36 y.o. female with a PMHx of pulmonary embolism and migraines who presents to the Emergency Department complaining of sudden onset, constant left hip pain that started about a month ago. She went to Urgent Care and given Prednisone, which resolved her pain. Her pain returned after she finished the medication, and now feels as if it radiating down her leg. She also reports sudden onset of associated left shoulder pain that began 2 days ago. She reports one episode of gasping for breath with initial onset of the shoulder pain, but she now describes the pain as aching. She tried using a foam roller without relief. She describes the pain as aching. She also has been having intermittent frontal, retro-orbital headaches over the last week. She reports a remote h/o of migraines, but states that she has been symptom free for years, and the current HAs do not present like her migraines. She used ibuprofen and tramadol (once) with no relief. PMH includes PE in 07/2011 while she was pregnant. Denies any use of estrogen hormone therapy, recent surgery/travel, fever, abdominal pain, nausea, vomiting, rash, or other complaints at this time. No h/o of gonorrhea.   Past Medical History:  Diagnosis Date  . Anemia    during 1st pregnancy;iron supp taken  . Bacterial infection   . H/O migraine   . H/O varicella   . Headache(784.0)    Migraines;was on  Topamax last year;no longer taking  . History of depression 09/10/2011   Prozac in the past  . History of PCOS 12/11/10  . History of polyhydramnios 09/10/2011   IOL for poly w/ G1 in 2002  . Hypertension    on Labetolol 100mg  bid  . Increased BMI 12/11/10  . Irregular menstrual cycle 2012  . Morbidly obese (HCC) 09/10/2011  . NSVD (normal spontaneous vaginal delivery) 03/24/2012  . PMS (premenstrual syndrome) 04/09/11   was Rx'd Prozac  . Pregnancy 09/25/2011  . Pulmonary embolism (HCC) 2013   on Lovenox 130mg  bid  . Trichomonas    as a teen  . Vitamin D deficiency 12/11/10  . Yeast infection     Patient Active Problem List   Diagnosis Date Noted  . Personal history of venous thrombosis and embolism 04/15/2012  . NSVD (normal spontaneous vaginal delivery) 03/24/2012  . Nuchal cord, delivered, current hospitalization 03/24/2012  . Anemia 01/02/2012  . Desires tubal ligation 12/03/2011  . Pregnancy 09/25/2011  . Morbidly obese (HCC) 09/10/2011  . History of depression 09/10/2011  . History of polyhydramnios 09/10/2011  . PE (pulmonary embolism) 08/02/2011  . HTN (hypertension) 08/02/2011  . Chest pain on breathing 07/31/2011  . SOB (shortness of breath) 07/31/2011  . PCOS (polycystic ovarian syndrome) 07/31/2011    Past Surgical History:  Procedure Laterality Date  . DILITATION & CURRETTAGE/HYSTROSCOPY WITH THERMACHOICE ABLATION N/A 01/06/2014   Procedure: DILATATION & CURETTAGE/HYSTEROSCOPY WITH THERMACHOICE ABLATION;  Surgeon: Jaymes Graff, MD;  Location: [facility];  Service:  Gynecology;  Laterality: N/A;  . HYSTEROSCOPY N/A 05/28/2012   Procedure: HYSTEROSCOPY;  Surgeon: Michael LitterNaima A Dillard, MD;  Location: [facility];  Service: Gynecology;  Laterality: N/A;  . NO PAST SURGERIES    . TOOTH EXTRACTION    . TUBAL LIGATION Bilateral 05/28/2012   Procedure: ESSURE TUBAL STERILIZATION;  Surgeon: Michael LitterNaima A Dillard, MD;  Location: [facility];  Service: Gynecology;  Laterality: Bilateral;    OB History     Gravida Para Term Preterm AB Living   3 3 3     3    SAB TAB Ectopic Multiple Live Births           3       Home Medications    Prior to Admission medications   Medication Sig Start Date End Date Taking? Authorizing Provider  aspirin 81 MG chewable tablet Chew 81 mg by mouth daily.   Yes [provider]  atorvastatin (LIPITOR) 10 MG tablet Take 10 mg by mouth daily.   Yes [provider]  buPROPion (WELLBUTRIN XL) 300 MG 24 hr tablet Take 300 mg by mouth daily. 08/18/12  Yes [provider]  FLUoxetine (PROZAC) 20 MG capsule Take 20 mg by mouth daily.   Yes [provider]  fluticasone (FLONASE) 50 MCG/ACT nasal spray Place 2 sprays into both nostrils daily.   Yes [provider]  metformin (FORTAMET) 500 MG (OSM) 24 hr tablet Take 500 mg by mouth daily.   Yes [provider]  Phentermine-Topiramate (QSYMIA) 3.75-23 MG CP24 Take 1 capsule by mouth daily.   Yes [provider]  sertraline (ZOLOFT) 25 MG tablet Take 25 mg by mouth daily.   Yes [provider]  traMADol (ULTRAM) 50 MG tablet Take 50 mg by mouth every 6 (six) hours as needed.   Yes [provider]  valACYclovir (VALTREX) 500 MG tablet Take 500 mg by mouth daily.   Yes [provider]  cyclobenzaprine (FLEXERIL) 10 MG tablet Take 1 tablet (10 mg total) by mouth 2 (two) times daily as needed for muscle spasms. 07/25/16   McDonald, Mia A, PA-C    Family History Family History  Problem Relation Age of Onset  . Hypertension Mother   . Hypertension Father   . Diabetes Maternal Grandmother   . Stroke Maternal Grandmother   . Thyroid disease Maternal Grandmother   . Cancer Paternal Grandmother        Colon  . Thyroid disease Paternal Grandmother   . Cancer Paternal Uncle        Prostate x 3  . Seizures Son        Epilelpsy    Social History Social History  Substance Use Topics  . Smoking status: Never Smoker  . Smokeless  tobacco: Never Used  . Alcohol use Yes     Comment: socially     Allergies   Patient has no known allergies.   Review of Systems Review of Systems  Constitutional: Negative for activity change and fever.  Respiratory: Positive for shortness of breath (once).   Cardiovascular: Negative for chest pain.  Gastrointestinal: Negative for abdominal pain, nausea and vomiting.  Musculoskeletal: Positive for myalgias. Negative for back pain.  Skin: Negative for rash.  Neurological: Positive for headaches.   Physical Exam Updated Vital Signs BP (!) 143/100 (BP Location: Right Wrist)   Pulse 88   Temp 98.4 F (36.9 C) (Oral)   Resp 16   Ht 5\' 3"  (1.6 m)   Wt (!) 316  lb (143.3 kg)   SpO2 98%   BMI 55.98 kg/m   Physical Exam  Constitutional: No distress.  HENT:  Head: Normocephalic.  Eyes: Conjunctivae are normal.  Neck: Neck supple.  Cardiovascular: Normal rate, regular rhythm, normal heart sounds and intact distal pulses.  Exam reveals no gallop and no friction rub.   No murmur heard. Pulmonary/Chest: Effort normal and breath sounds normal. No respiratory distress. She has no wheezes. She has no rales.  Abdominal: Soft. She exhibits no distension. There is no tenderness.  Musculoskeletal: Normal range of motion. She exhibits tenderness. She exhibits no edema or deformity.  +2 radial, PT, and DP pulses. 5/5 strength of the LE and BUE. Full active and passive ROM of the neck and bilateral ankles, knees, hips, wrists, shoulders, and elbows. Increased pain with passive and active external rotation of the left hip. Increased pain with extension of the right shoulder with passive and active ROM. Point tenderness over the lateral aspect of the left hip. TTP along the medial order of the left scapula. No overlying erythema, warmth, or swelling to the skin.   Neurological: She is alert.  Skin: Skin is warm. No rash noted.  Psychiatric: Her behavior is normal.  Nursing note and vitals  reviewed.    ED Treatments / Results  DIAGNOSTIC STUDIES: Oxygen Saturation is 98% on RA, normal by my interpretation.    COORDINATION OF CARE: 10:18 AM- Pt advised of plan for treatment and pt agrees.  Labs (all labs ordered are listed, but only abnormal results are displayed) Labs Reviewed - No data to display  EKG  EKG Interpretation  Date/Time:  Thursday July 25 2016 09:52:38 EDT Ventricular Rate:  92 PR Interval:    QRS Duration: 98 QT Interval:  368 QTC Calculation: 456 R Axis:   25 Text Interpretation:  Sinus rhythm Baseline wander in lead(s) II III aVF No significant change since last tracing Confirmed by Doug Sou [phone]) on 07/26/2016 3:59:58 PM       Radiology Dg Shoulder Left  Result Date: 07/25/2016 CLINICAL DATA:  Pt felt pop in lt shoulder x 2 days ago and pain has since increased, no other known trauma EXAM: LEFT SHOULDER - 2+ VIEW COMPARISON:  None. FINDINGS: Glenohumeral joint is intact. No evidence of scapular fracture or humeral fracture. The acromioclavicular joint is intact. IMPRESSION: No fracture or dislocation. Electronically Signed   By: Genevive Bi M.D.   On: 07/25/2016 11:48   Dg Hip Unilat With Pelvis 2-3 Views Left  Result Date: 07/25/2016 CLINICAL DATA:  Pain for 10 days EXAM: DG HIP (WITH OR WITHOUT PELVIS) 2-3V LEFT COMPARISON:  None. FINDINGS: Frontal pelvis as well as frontal and lateral left hip images were obtained. No fracture or dislocation. Joint spaces appear normal. No erosive change. Stents are noted in each fallopian tube. IMPRESSION: No fracture or dislocation.  No appreciable arthropathy. Electronically Signed   By: Bretta Bang III M.D.   On: 07/25/2016 11:37    Procedures Procedures (including critical care time)  Medications Ordered in ED Medications - No data to display   Initial Impression / Assessment and Plan / ED Course  I have reviewed the triage vital signs and the nursing notes.  Pertinent labs &  imaging results that were available during my care of the patient were reviewed by me and considered in my medical decision making (see chart for details).     Patient presenting with worsening, atraumatic polyarticular joint pain of the left hip x1  month and shoulder x2 days. She was evaluated by urgent care for left hip pain that began 1 month ago and given a short course of prednisone for likely bursitis. She reports complete resolution of the pain while taking prednisone, but reports the pain returned. The patient is afebrile. No overlying erythema, warmth, or swelling to the overlying skin. Unlikely septic joint or gout. Possibly musculoskeletal vs rheumatological symptoms. VSS. NAD. Will d/c the patient with conservative tx for musculoskeletal pain with follow-up to PCP if no improvement in symptoms.   Final Clinical Impressions(s) / ED Diagnoses   Final diagnoses:  Acute pain of left shoulder  Left hip pain    New Prescriptions Discharge Medication List as of 07/25/2016 12:36 PM    START taking these medications   Details  cyclobenzaprine (FLEXERIL) 10 MG tablet Take 1 tablet (10 mg total) by mouth 2 (two) times daily as needed for muscle spasms., Starting Thu 07/25/2016, Print       I personally performed the services described in this documentation, which was scribed in my presence. The recorded information has been reviewed and is accurate.      Frederik Pear A, PA-C 07/26/16 2001    Melene Plan, DO 07/27/16 [phone]

## 2016-07-25 NOTE — Discharge Instructions (Signed)
Please call the number on her discharge paperwork if he would like to get established with a new primary care provider. Please let them know that you were seen in the emergency department today for atraumatic hip and shoulder pain and that x-rays were performed. If you develop new or worsening symptoms including, redness, swelling, or if the joints become hot, or if the developed fever, numbness, tingling, or weakness, please return to the emergency department. You can take Flexeril as needed up to 2 times per day to help with muscle tightness and cramping. Please use constant with this medication because it can make you sleepy so please do not take before you drive or have to work.

## 2016-07-25 NOTE — ED Notes (Signed)
Bed: WTR5 Expected date:  Expected time:  Means of arrival:  Comments: 

## 2016-07-25 NOTE — ED Triage Notes (Signed)
Pt reports pain in l /shoulder, l/hip and l/leg pain. Pt has hx of PE. Tx for l/hip pain 10 days ago. Pain in l/hip and shoulder increased over last two days. Treated with Tramadol x 1 with some relief. Denies shortness of breath, denies chest pain.

## 2016-11-27 ENCOUNTER — Other Ambulatory Visit: Payer: Self-pay | Admitting: Obstetrics and Gynecology

## 2016-11-27 DIAGNOSIS — N63 Unspecified lump in unspecified breast: Secondary | ICD-10-CM

## 2016-12-16 ENCOUNTER — Ambulatory Visit: Payer: BLUE CROSS/BLUE SHIELD

## 2016-12-16 ENCOUNTER — Ambulatory Visit
Admission: RE | Admit: 2016-12-16 | Discharge: 2016-12-16 | Disposition: A | Discharge status: Home / Self Care | Payer: BLUE CROSS/BLUE SHIELD | Source: Ambulatory Visit | Attending: Obstetrics and Gynecology | Admitting: Obstetrics and Gynecology

## 2016-12-16 DIAGNOSIS — N63 Unspecified lump in unspecified breast: Secondary | ICD-10-CM

## 2016-12-28 ENCOUNTER — Emergency Department (HOSPITAL_COMMUNITY): Payer: BLUE CROSS/BLUE SHIELD

## 2016-12-28 ENCOUNTER — Other Ambulatory Visit: Payer: Self-pay

## 2016-12-28 ENCOUNTER — Emergency Department (HOSPITAL_COMMUNITY)
Admission: EM | Admit: 2016-12-28 | Discharge: 2016-12-28 | Disposition: A | Discharge status: Home / Self Care | Payer: BLUE CROSS/BLUE SHIELD | Attending: Emergency Medicine | Admitting: Emergency Medicine

## 2016-12-28 DIAGNOSIS — R102 Pelvic and perineal pain: Secondary | ICD-10-CM | POA: Diagnosis not present

## 2016-12-28 DIAGNOSIS — R1032 Left lower quadrant pain: Secondary | ICD-10-CM | POA: Diagnosis not present

## 2016-12-28 DIAGNOSIS — Z79899 Other long term (current) drug therapy: Secondary | ICD-10-CM | POA: Insufficient documentation

## 2016-12-28 DIAGNOSIS — Z86718 Personal history of other venous thrombosis and embolism: Secondary | ICD-10-CM | POA: Diagnosis not present

## 2016-12-28 DIAGNOSIS — Z7982 Long term (current) use of aspirin: Secondary | ICD-10-CM | POA: Diagnosis not present

## 2016-12-28 DIAGNOSIS — I10 Essential (primary) hypertension: Secondary | ICD-10-CM | POA: Diagnosis not present

## 2016-12-28 LAB — CBC WITH DIFFERENTIAL/PLATELET
Basophils Absolute: 0 10*3/uL (ref 0.0–0.1)
Basophils Relative: 0 %
EOS PCT: 1 %
Eosinophils Absolute: 0.1 10*3/uL (ref 0.0–0.7)
HCT: 34.3 % — ABNORMAL LOW (ref 36.0–46.0)
Hemoglobin: 10.7 g/dL — ABNORMAL LOW (ref 12.0–15.0)
LYMPHS ABS: 1.9 10*3/uL (ref 0.7–4.0)
LYMPHS PCT: 34 %
MCH: 26.2 pg (ref 26.0–34.0)
MCHC: 31.2 g/dL (ref 30.0–36.0)
MCV: 84.1 fL (ref 78.0–100.0)
Monocytes Absolute: 0.4 10*3/uL (ref 0.1–1.0)
Monocytes Relative: 8 %
Neutro Abs: 3.2 10*3/uL (ref 1.7–7.7)
Neutrophils Relative %: 57 %
PLATELETS: 225 10*3/uL (ref 150–400)
RBC: 4.08 MIL/uL (ref 3.87–5.11)
RDW: 14.3 % (ref 11.5–15.5)
WBC: 5.5 10*3/uL (ref 4.0–10.5)

## 2016-12-28 LAB — COMPREHENSIVE METABOLIC PANEL
ALBUMIN: 3.2 g/dL — AB (ref 3.5–5.0)
ALT: 17 U/L (ref 14–54)
AST: 17 U/L (ref 15–41)
Alkaline Phosphatase: 69 U/L (ref 38–126)
Anion gap: 8 (ref 5–15)
BUN: 11 mg/dL (ref 6–20)
CO2: 23 mmol/L (ref 22–32)
CREATININE: 0.83 mg/dL (ref 0.44–1.00)
Calcium: 8.8 mg/dL — ABNORMAL LOW (ref 8.9–10.3)
Chloride: 105 mmol/L (ref 101–111)
GFR calc non Af Amer: >60 mL/min (ref >60)
Glucose, Bld: 126 mg/dL — ABNORMAL HIGH (ref 65–99)
Potassium: 3.8 mmol/L (ref 3.5–5.1)
Sodium: 136 mmol/L (ref 135–145)
Total Bilirubin: 0.6 mg/dL (ref 0.3–1.2)
Total Protein: 6.3 g/dL — ABNORMAL LOW (ref 6.5–8.1)

## 2016-12-28 LAB — WET PREP, GENITAL
SPERM: NONE SEEN
TRICH WET PREP: NONE SEEN
Yeast Wet Prep HPF POC: NONE SEEN

## 2016-12-28 LAB — I-STAT BETA HCG BLOOD, ED (MC, WL, AP ONLY): I-stat hCG, quantitative: <5 m[IU]/mL (ref <5)

## 2016-12-28 LAB — LIPASE, BLOOD: Lipase: 27 U/L (ref 11–51)

## 2016-12-28 MED ORDER — MORPHINE SULFATE (PF) 4 MG/ML IV SOLN
4.0000 mg | Freq: Once | INTRAVENOUS | Status: AC
  Administered 2016-12-28: 4 mg via INTRAVENOUS
  Filled 2016-12-28: qty 1

## 2016-12-28 MED ORDER — ONDANSETRON HCL 4 MG/2ML IJ SOLN
4.0000 mg | Freq: Once | INTRAMUSCULAR | Status: AC
  Administered 2016-12-28: 4 mg via INTRAVENOUS
  Filled 2016-12-28: qty 2

## 2016-12-28 NOTE — Discharge Instructions (Signed)
You came to the ED for left lower abdominal pain.  Your lab work was mostly normal. Your ultrasound results are below.   IMPRESSION:  3 cm complex cyst seen in right ovary most consistent with  hemorrhagic cyst or endometrioma. Short-interval follow up  ultrasound in 6-12 weeks is recommended, preferably during the week  following the patient's normal menses.     There is no Doppler evidence of ovarian torsion.     1.1 cm cystic area seen in endometrium most consistent with prior  ablation.   Ultrasound shows a small cyst on the RIGHT ovary, possibly a cyst or endometrioma. Both of these are treated as outpatient by OBGYN.    You declined narcotic pain medications today.  And you did not provide a urine sample, we are unable to determine if you have a urinary tract infection.   For pain, you can take 1000 mg tylenol PLUS 600 mg ibuprofen every 8 hours.    Contact your OBGYN and make an appointment for re-evaluation as soon as possible. You need repeat ultrasound within 6-12 weeks to reassess cyst on RIGHT ovary.   Return to ED for nausea, vomiting, changes in abdominal pain, burning with urination, blood in your urine, abnormal vaginal discharge or bleeding.

## 2016-12-28 NOTE — ED Triage Notes (Signed)
Pt reports left lower abdominal pain sudden, intense that woke her up out of her sleep pain has improved

## 2016-12-28 NOTE — ED Notes (Signed)
Pt stated she already urinated when taken to ultrasound but gave no urine sample. Notified pt of need for urine sample.

## 2016-12-28 NOTE — ED Provider Notes (Signed)
[facility] EMERGENCY DEPARTMENT Provider Note   CSN: [id number] Arrival date & time: 12/28/16  0541     History   Chief Complaint Chief Complaint  Patient presents with  . Abdominal Pain    HPI Heather Vargas is a 36 y.o. female with history of PCOS, D&C, Essure tubal sterilization, obesity, pulmonary embolism presents to the ED for evaluation of intermittent, sharp mid/left lower quadrant abdominal pain that woke her up out of her sleep this morning. Pain is starting to improve but is still lingering. Worse with movement and direct palpation. No interventions PTA. No alleviating factors. States she had similar but less intense pain last month during her cycle and she is currently in her cycle right now. States she does not bleed bc of D&C and Essure procedure but keeps track of her cycles on an app and she gets them monthly. LMP in 2014. States last month this left lower quadrant sharp pain was intermittently worse but eventually resolved after her cycle was over. This time however pain was sharper and more intense. She is sexually active with men only w/o contraception use (s/p tubal). No abdominal surgeries. No h/o IBS, IBD, diverticulitis.  No fevers, chills, nausea, vomiting, dysuria, hematuria, flank pain, abnormal vaginal discharge, diarrhea, constipation, melena, hematochezia.   HPI  Past Medical History:  Diagnosis Date  . Anemia    during 1st pregnancy;iron supp taken  . Bacterial infection   . H/O migraine   . H/O varicella   . Headache(784.0)    Migraines;was on Topamax last year;no longer taking  . History of depression 09/10/2011   Prozac in the past  . History of PCOS 12/11/10  . History of polyhydramnios 09/10/2011   IOL for poly w/ G1 in 2002  . Hypertension    on Labetolol 100mg  bid  . Increased BMI 12/11/10  . Irregular menstrual cycle 2012  . Morbidly obese (HCC) 09/10/2011  . NSVD (normal spontaneous vaginal delivery) 03/24/2012  . PMS  (premenstrual syndrome) 04/09/11   was Rx'd Prozac  . Pregnancy 09/25/2011  . Pulmonary embolism (HCC) 2013   on Lovenox 130mg  bid  . Trichomonas    as a teen  . Vitamin D deficiency 12/11/10  . Yeast infection     Patient Active Problem List   Diagnosis Date Noted  . Personal history of venous thrombosis and embolism 04/15/2012  . NSVD (normal spontaneous vaginal delivery) 03/24/2012  . Nuchal cord, delivered, current hospitalization 03/24/2012  . Anemia 01/02/2012  . Desires tubal ligation 12/03/2011  . Pregnancy 09/25/2011  . Morbidly obese (HCC) 09/10/2011  . History of depression 09/10/2011  . History of polyhydramnios 09/10/2011  . PE (pulmonary embolism) 08/02/2011  . HTN (hypertension) 08/02/2011  . Chest pain on breathing 07/31/2011  . SOB (shortness of breath) 07/31/2011  . PCOS (polycystic ovarian syndrome) 07/31/2011    Past Surgical History:  Procedure Laterality Date  . DILITATION & CURRETTAGE/HYSTROSCOPY WITH THERMACHOICE ABLATION N/A 01/06/2014   Procedure: DILATATION & CURETTAGE/HYSTEROSCOPY WITH THERMACHOICE ABLATION;  Surgeon: Jaymes Graff, MD;  Location: [facility];  Service: Gynecology;  Laterality: N/A;  . HYSTEROSCOPY N/A 05/28/2012   Procedure: HYSTEROSCOPY;  Surgeon: Michael Litter, MD;  Location: [facility];  Service: Gynecology;  Laterality: N/A;  . NO PAST SURGERIES    . TOOTH EXTRACTION    . TUBAL LIGATION Bilateral 05/28/2012   Procedure: ESSURE TUBAL STERILIZATION;  Surgeon: Michael Litter, MD;  Location: [facility];  Service: Gynecology;  Laterality: Bilateral;  OB History    Gravida Para Term Preterm AB Living   3 3 3     3    SAB TAB Ectopic Multiple Live Births           3       Home Medications    Prior to Admission medications   Medication Sig Start Date End Date Taking? Authorizing Provider  aspirin 81 MG chewable tablet Chew 81 mg by mouth daily.   Yes [provider]  EVENING PRIMROSE OIL PO Take 1,300 mg by mouth daily.   Yes  [provider]  fluticasone (FLONASE) 50 MCG/ACT nasal spray Place 2 sprays into both nostrils daily.   Yes [provider]  Multiple Vitamins-Minerals (MULTIVITAMIN WITH MINERALS) tablet Take 1 tablet by mouth daily.   Yes [provider]  Phentermine-Topiramate (QSYMIA) 3.75-23 MG CP24 Take 1 capsule by mouth daily.   Yes [provider]  valACYclovir (VALTREX) 500 MG tablet Take 500 mg by mouth daily.   Yes [provider]  cyclobenzaprine (FLEXERIL) 10 MG tablet Take 1 tablet (10 mg total) by mouth 2 (two) times daily as needed for muscle spasms. Patient not taking: Reported on 12/28/2016 07/25/16   Barkley BoardsMcDonald, Mia A, PA-C    Family History Family History  Problem Relation Age of Onset  . Hypertension Mother   . Hypertension Father   . Diabetes Maternal Grandmother   . Stroke Maternal Grandmother   . Thyroid disease Maternal Grandmother   . Cancer Paternal Grandmother        Colon  . Thyroid disease Paternal Grandmother   . Cancer Paternal Uncle        Prostate x 3  . Seizures Son        Epilelpsy    Social History Social History   Tobacco Use  . Smoking status: Never Smoker  . Smokeless tobacco: Never Used  Substance Use Topics  . Alcohol use: Yes    Comment: socially  . Drug use: No     Allergies   Patient has no known allergies.   Review of Systems Review of Systems  Gastrointestinal: Positive for abdominal pain.  Genitourinary: Positive for menstrual problem.  All other systems reviewed and are negative.    Physical Exam Updated Vital Signs BP (!) 147/86   Pulse 91   Temp 98.2 F (36.8 C) (Oral)   Resp (!) 22   Ht 5\' 3"  (1.6 m)   Wt (!) 138.8 kg (306 lb)   SpO2 98%   BMI 54.21 kg/m   Physical Exam  Constitutional: She is oriented to person, place, and time. She appears well-developed and well-nourished. No distress.  Non toxic.   HENT:  Head: Normocephalic and atraumatic.  Nose: Nose normal.    Mouth/Throat: Oropharynx is clear and moist. No oropharyngeal exudate.  Eyes: Conjunctivae and EOM are normal. Pupils are equal, round, and reactive to light.  Neck: Normal range of motion. Neck supple. No JVD present.  Cardiovascular: Normal rate, regular rhythm, normal heart sounds and intact distal pulses.  No murmur heard. Pulmonary/Chest: Effort normal and breath sounds normal. No respiratory distress. She has no wheezes. She has no rales. She exhibits no tenderness.  No chest wall tenderness   Abdominal: Soft. Bowel sounds are normal. She exhibits no distension and no mass. There is tenderness in the suprapubic area and left lower quadrant. There is no rebound and no guarding.  Obese abdomen +Diffuse, mild tenderness to left mid/lower and suprapubic area No  G/R/R No CVAT No bulging or tenderness in inguinal region  Genitourinary: Left adnexum displays tenderness.  Genitourinary Comments:  Chaperone present during exam. Patient tolerated exam well.  External genitalia normal without erythema, edema, tenderness, discharge or lesions.  No groin lymphadenopathy.  Vaginal mucosa and cervix normal, pink without discharge or lesions.  Uterus in midline, smooth, not enlarged or tender.  No CMT.  +Mild left adnexal tenderness w/o fullness. Normal, non tender right adnexum.   Musculoskeletal: Normal range of motion. She exhibits no deformity.  Lymphadenopathy:    She has no cervical adenopathy.  Neurological: She is alert and oriented to person, place, and time. No sensory deficit.  Skin: Skin is warm and dry. Capillary refill takes less than 2 seconds.  Psychiatric: She has a normal mood and affect. Her behavior is normal. Judgment and thought content normal.  Nursing note and vitals reviewed.    ED Treatments / Results  Labs (all labs ordered are listed, but only abnormal results are displayed) Labs Reviewed  WET PREP, GENITAL - Abnormal; Notable for the following components:       Result Value   Clue Cells Wet Prep HPF POC PRESENT (*)    WBC, Wet Prep HPF POC TOO NUMEROUS TO COUNT (*)    All other components within normal limits  COMPREHENSIVE METABOLIC PANEL - Abnormal; Notable for the following components:   Glucose, Bld 126 (*)    Calcium 8.8 (*)    Total Protein 6.3 (*)    Albumin 3.2 (*)    All other components within normal limits  CBC WITH DIFFERENTIAL/PLATELET - Abnormal; Notable for the following components:   Hemoglobin 10.7 (*)    HCT 34.3 (*)    All other components within normal limits  LIPASE, BLOOD  URINALYSIS, ROUTINE W REFLEX MICROSCOPIC  I-STAT BETA HCG BLOOD, ED ([facility], [facility], [facility] ONLY)  GC/CHLAMYDIA PROBE AMP ([place]) NOT AT [facility]    EKG  EKG Interpretation None       Radiology US Transvaginal Non-ob  Result Date: 12/28/2016 CLINICAL DATA:  Acute left lower quadrant abdominal pain. EXAM: TRANSABDOMINAL AND TRANSVAGINAL ULTRASOUND OF PELVIS DOPPLER ULTRASOUND OF OVARIES TECHNIQUE: Both transabdominal and transvaginal ultrasound examinations of the pelvis were performed. Transabdominal technique was performed for global imaging of the pelvis including uterus, ovaries, adnexal regions, and pelvic cul-de-sac. It was necessary to proceed with endovaginal exam following the transabdominal exam to visualize the ovaries. Color and duplex Doppler ultrasound was utilized to evaluate blood flow to the ovaries. COMPARISON:  None available currently. FINDINGS: Uterus Measurements: 8.1 x 6.3 x 5.1 cm. 7 mm cystic area is noted posteriorly and to the right in the uterus which may represent cystic fibroid. Endometrium Thickness: 9 mm which is within normal limits. 1.1 cm cystic area is noted which may represent former ablation site. Right ovary Measurements: 4.3 x 2.9 x 2.9 cm. 3 cm complex cyst is noted concerning for hemorrhagic cyst or endometrioma. Left ovary Measurements: 3.9 x 2.7 x 2.2 cm. Normal appearance/no adnexal mass. Pulsed Doppler evaluation of  both ovaries demonstrates normal low-resistance arterial and venous waveforms. Other findings Small amount of free fluid is noted which most likely is physiologic. IMPRESSION: 3 cm complex cyst seen in right ovary most consistent with hemorrhagic cyst or endometrioma. Short-interval follow up ultrasound in 6-12 weeks is recommended, preferably during the week following the patient's normal menses. There is no Doppler evidence of ovarian torsion. 1.1 cm cystic area seen in endometrium most consistent with prior  ablation. Electronically Signed   By: Lupita RaiderJames  Green Jr, M.D.   On: 12/28/2016 08:55   [place] Pelvis Complete  Result Date: 12/28/2016 CLINICAL DATA:  Acute left lower quadrant abdominal pain. EXAM: TRANSABDOMINAL AND TRANSVAGINAL ULTRASOUND OF PELVIS DOPPLER ULTRASOUND OF OVARIES TECHNIQUE: Both transabdominal and transvaginal ultrasound examinations of the pelvis were performed. Transabdominal technique was performed for global imaging of the pelvis including uterus, ovaries, adnexal regions, and pelvic cul-de-sac. It was necessary to proceed with endovaginal exam following the transabdominal exam to visualize the ovaries. Color and duplex Doppler ultrasound was utilized to evaluate blood flow to the ovaries. COMPARISON:  None available currently. FINDINGS: Uterus Measurements: 8.1 x 6.3 x 5.1 cm. 7 mm cystic area is noted posteriorly and to the right in the uterus which may represent cystic fibroid. Endometrium Thickness: 9 mm which is within normal limits. 1.1 cm cystic area is noted which may represent former ablation site. Right ovary Measurements: 4.3 x 2.9 x 2.9 cm. 3 cm complex cyst is noted concerning for hemorrhagic cyst or endometrioma. Left ovary Measurements: 3.9 x 2.7 x 2.2 cm. Normal appearance/no adnexal mass. Pulsed Doppler evaluation of both ovaries demonstrates normal low-resistance arterial and venous waveforms. Other findings Small amount of free fluid is noted which most likely is  physiologic. IMPRESSION: 3 cm complex cyst seen in right ovary most consistent with hemorrhagic cyst or endometrioma. Short-interval follow up ultrasound in 6-12 weeks is recommended, preferably during the week following the patient's normal menses. There is no Doppler evidence of ovarian torsion. 1.1 cm cystic area seen in endometrium most consistent with prior ablation. Electronically Signed   By: Lupita RaiderJames  Green Jr, M.D.   On: 12/28/2016 08:55   [place] Art/ven Flow Abd Pelv Doppler  Result Date: 12/28/2016 CLINICAL DATA:  Acute left lower quadrant abdominal pain. EXAM: TRANSABDOMINAL AND TRANSVAGINAL ULTRASOUND OF PELVIS DOPPLER ULTRASOUND OF OVARIES TECHNIQUE: Both transabdominal and transvaginal ultrasound examinations of the pelvis were performed. Transabdominal technique was performed for global imaging of the pelvis including uterus, ovaries, adnexal regions, and pelvic cul-de-sac. It was necessary to proceed with endovaginal exam following the transabdominal exam to visualize the ovaries. Color and duplex Doppler ultrasound was utilized to evaluate blood flow to the ovaries. COMPARISON:  None available currently. FINDINGS: Uterus Measurements: 8.1 x 6.3 x 5.1 cm. 7 mm cystic area is noted posteriorly and to the right in the uterus which may represent cystic fibroid. Endometrium Thickness: 9 mm which is within normal limits. 1.1 cm cystic area is noted which may represent former ablation site. Right ovary Measurements: 4.3 x 2.9 x 2.9 cm. 3 cm complex cyst is noted concerning for hemorrhagic cyst or endometrioma. Left ovary Measurements: 3.9 x 2.7 x 2.2 cm. Normal appearance/no adnexal mass. Pulsed Doppler evaluation of both ovaries demonstrates normal low-resistance arterial and venous waveforms. Other findings Small amount of free fluid is noted which most likely is physiologic. IMPRESSION: 3 cm complex cyst seen in right ovary most consistent with hemorrhagic cyst or endometrioma. Short-interval follow up  ultrasound in 6-12 weeks is recommended, preferably during the week following the patient's normal menses. There is no Doppler evidence of ovarian torsion. 1.1 cm cystic area seen in endometrium most consistent with prior ablation. Electronically Signed   By: Lupita RaiderJames  Green Jr, M.D.   On: 12/28/2016 08:55    Procedures Procedures (including critical care time)  Medications Ordered in ED Medications  morphine 4 MG/ML injection 4 mg (4 mg Intravenous Given 12/28/16 0649)  ondansetron (ZOFRAN) injection 4  mg (4 mg Intravenous Given 12/28/16 0649)     Initial Impression / Assessment and Plan / ED Course  I have reviewed the triage vital signs and the nursing notes.  Pertinent labs & imaging results that were available during my care of the patient were reviewed by me and considered in my medical decision making (see chart for details).  Clinical Course as of Dec 29 1038  Sat Dec 28, 2016  0935 Uterus Measurements: 8.1 x 6.3 x 5.1 cm. 7 mm cystic area is noted posteriorly and to the right in the uterus which may represent cystic fibroid.  Endometrium Thickness: 9 mm which is within normal limits. 1.1 cm cystic area is noted which may represent former ablation site.  Right ovary Measurements: 4.3 x 2.9 x 2.9 cm. 3 cm complex cyst is noted concerning for hemorrhagic cyst or endometrioma.  Left ovary Measurements: 3.9 x 2.7 x 2.2 cm. Normal appearance/no adnexal mass. US Pelvis Complete [CG]  1030 US Pelvis Complete [CG]    Clinical Course User Index [CG] Liberty Handy, PA-C   36 year old female with history of PCOS status post D&C and a sugar sterilization presents to ED for sudden onset, improving left lower quadrant abdominal pain. States had similar but less intense pain last month didn't during her cycle that resolved when her cycle ended. She is currently on her cycle now. No other associated symptoms.  On exam, she has left lower quadrant and suprapubic tenderness but more  tenderness with adnexal palpation. Abdomen is soft without peritoneal signs. Afebrile.  Considering ovarian cyst, fibroid or torsion Less likely diverticulitis or other GI etiology given lack of associated symptoms.   CBC, CMP, lipase, HCG negative. Pending wet prep and pelvic ultrasound.   Final Clinical Impressions(s) / ED Diagnoses   U/S shows possible cystic fibroid in uterus and right ovarian hemorrhagic cyst vs endometrioma, left ovary normal. Unable to collect urine. Given reassuring lab and imaging work up, will d/c pt.  No indication for further emergent lab work or imaging. Really low suspicion for GI etiology including diverticulitis given lack of fever, diarrhea, melena, hematochezia. She has no other systemic symptoms associated with her abdominal pain.   Prior to d/c I re-evaluated pt and discussed work up so far. She was upset bc her pain was not gone.  Discussed reassuring work up and OBGYN outpatient indicated. Offered rx for tramadol or percocet but she refused stating these don't work. Ibuprofen and tylenol also do not work. Adequate for d/c.   Final diagnoses:  Abdominal pain, left lower quadrant  Pelvic pain    ED Discharge Orders    None       Jerrell Mylar 12/28/16 1040    Geoffery Lyons, MD 12/28/16 2253

## 2016-12-28 NOTE — ED Notes (Signed)
Patient transported to Ultrasound 

## 2016-12-30 LAB — GC/CHLAMYDIA PROBE AMP (~~LOC~~) NOT AT ARMC
Chlamydia: NEGATIVE
Neisseria Gonorrhea: NEGATIVE

## 2017-01-15 ENCOUNTER — Ambulatory Visit: Payer: BLUE CROSS/BLUE SHIELD | Admitting: Women's Health

## 2017-01-15 ENCOUNTER — Telehealth: Payer: Self-pay | Admitting: *Deleted

## 2017-01-15 ENCOUNTER — Encounter: Payer: Self-pay | Admitting: Women's Health

## 2017-01-15 VITALS — BP 130/84 | Ht 64.0 in | Wt 312.0 lb

## 2017-01-15 DIAGNOSIS — A5901 Trichomonal vulvovaginitis: Secondary | ICD-10-CM | POA: Diagnosis not present

## 2017-01-15 DIAGNOSIS — N83201 Unspecified ovarian cyst, right side: Secondary | ICD-10-CM | POA: Diagnosis not present

## 2017-01-15 LAB — WET PREP FOR TRICH, YEAST, CLUE

## 2017-01-15 MED ORDER — METRONIDAZOLE 500 MG PO TABS: ORAL_TABLET | ORAL | 0 refills | Status: DC

## 2017-01-15 MED ORDER — METRONIDAZOLE 500 MG PO TABS: 500.0000 mg | ORAL_TABLET | Freq: Two times a day (BID) | ORAL | 0 refills | Status: DC

## 2017-01-15 NOTE — Telephone Encounter (Signed)
Victorino DikeJennifer [facility] [employer] [facility] for flagyl 500 mg 2 set of directions on Rx. Correct Rx directions sent for #4 tablet take 4 tablets daily.

## 2017-01-15 NOTE — Patient Instructions (Addendum)
Cervicitis Cervicitis is when the cervix gets irritated and swollen. Your cervix is the lower end of your uterus. Follow these instructions at home:  Do not have sex until your doctor says it is okay.  Take over-the-counter and prescription medicines only as told by your doctor.  If you were prescribed an antibiotic medicine, take it as told by your doctor. Do not stop taking it even if you start to feel better.  Keep all follow-up visits as told by your doctor. This is important. Contact a doctor if:  Your symptoms come back after treatment.  Your symptoms get worse after treatment.  You have a fever.  You feel tired (fatigued).  Your belly (abdomen) hurts.  You feel like you are going to throw up (are nauseous).  You throw up (vomit).  You have watery poop (diarrhea).  Your back hurts. Get help right away if:  You have very bad pain in your belly, and medicine does not help it.  You cannot pee (urinate). Summary  Cervicitis is when the cervix gets irritated and swollen.  Do not have sex until your doctor says it is okay.  If you need to take an antibiotic, do not stop taking even if you start to feel better. Take medicines only as told by your doctor. This information is not intended to replace advice given to you by your health care provider. Make sure you discuss any questions you have with your health care provider. Document Released: 10/31/2007 Document Revised: 10/08/2015 Document Reviewed: 10/08/2015 Elsevier Interactive Patient Education  2017 Elsevier Inc. Bariatric Surgery Information Bariatric surgery, also called weight loss surgery, is a procedure that helps you lose weight. You may consider or your health care provider may suggest bariatric surgery if:  You are severely obese and have been unable to lose weight through diet and exercise.  You have health problems related to obesity, such as: ? Type 2 diabetes. ? Heart disease. ? Lung  disease.  How does bariatric surgery help me lose weight? Bariatric surgery helps you lose weight by decreasing how much food your body absorbs. This is done by closing off part of your stomach to make it smaller. This restricts the amount of food your stomach can hold. Bariatric surgery can also change your body's regular digestive process, so that food bypasses the parts of your body that absorb calories and nutrients. If you decide to have bariatric surgery, it is important to continue to eat a healthy diet and exercise regularly after the surgery. What are the different kinds of bariatric surgery? There are two kinds of bariatric surgeries:  Restrictive surgeries make your stomach smaller. They do not change your digestive process. The smaller the size of your new stomach, the less food you can eat. There are different types of restrictive surgeries.  Malabsorptive surgeries both make your stomach smaller and alter your digestive process so that your body processes less calories and nutrients. These are the most common kind of bariatric surgery. There are different types of malabsorptive surgeries.  What are the different types of restrictive surgery? Adjustable Gastric Banding In this procedure, an inflatable band is placed around your stomach near the upper end. This makes the passageway for food into the rest of your stomach much smaller. The band can be adjusted, making it tighter or looser, by filling it with salt solution. Your surgeon can adjust the band based on how are you feeling and how much weight you are losing. The band can be removed  in the future. Vertical Banded Gastroplasty In this procedure, staples are used to separate your stomach into two parts, a small upper pouch and a bigger lower pouch. This decreases how much food you can eat. Sleeve Gastrectomy In this procedure, your stomach is made smaller. This is done by surgically removing a large part of your stomach. When your  stomach is smaller, you feel full more quickly and reduce how much you eat. What are the different types of malabsorptive surgery? Roux-en-Y Gastric Bypass (RGB) This is the most common weight loss surgery. In this procedure, a small stomach pouch is created in the upper part of your stomach. Next, this small stomach pouch is attached directly to the middle part of your small intestine. The farther down your small intestine the new connection is made, the fewer calories and nutrients you will absorb. Biliopancreatic Diversion with Duodenal Switch (BPD/DS) This is a multi-step procedure. In this procedure, a large part of your stomach is removed, making your stomach smaller. Next, this smaller stomach is attached to the lower part of your small intestine. Like the RGB surgery, you absorb fewer calories and nutrients the farther down your small intestine the attachment is made. What are the risks of bariatric surgery? As with any surgical procedure, each type of bariatric surgery has its own risks. These risks also depend on your age, your overall health, and any other medical conditions you may have. When deciding on bariatric surgery, it is very important to:  Talk to your health care provider and choose the surgery that is best for you.  Ask your health care provider about specific risks for the surgery you choose.  Where to find more information:  American Society for Metabolic & Bariatric Surgery: www.asmbs.org  Weight-control Information Network (WIN): win.StageSync.si This information is not intended to replace advice given to you by your health care provider. Make sure you discuss any questions you have with your health care provider. Document Released: 01/21/2005 Document Revised: 06/29/2015 Document Reviewed: 07/22/2012 Elsevier Interactive Patient Education  2017 Elsevier Inc.  Ovarian Cyst An ovarian cyst is a fluid-filled sac on an ovary. The ovaries are organs that make eggs in  women. Most ovarian cysts go away on their own and are not cancerous (are benign). Some cysts need treatment. Follow these instructions at home:  Take over-the-counter and prescription medicines only as told by your doctor.  Do not drive or use heavy machinery while taking prescription pain medicine.  Get pelvic exams and Pap tests as often as told by your doctor.  Return to your normal activities as told by your doctor. Ask your doctor what activities are safe for you.  Do not use any products that contain nicotine or tobacco, such as cigarettes and e-cigarettes. If you need help quitting, ask your doctor.  Keep all follow-up visits as told by your doctor. This is important. Contact a doctor if:  Your periods are: ? Late. ? Irregular. ? Painful.  Your periods stop.  You have pelvic pain that does not go away.  You have pressure on your bladder.  You have trouble making your bladder empty when you pee (urinate).  You have pain during sex.  You have any of the following in your belly (abdomen): ? A feeling of fullness. ? Pressure. ? Discomfort. ? Pain that does not go away. ? Swelling.  You feel sick most of the time.  You have trouble pooping (have constipation).  You are not as hungry as usual (you  lose your appetite).  You get very bad acne.  You start to have more hair on your body and face.  You are gaining weight or losing weight without changing your exercise and eating habits.  You think you may be pregnant. Get help right away if:  You have belly pain that is very bad or gets worse.  You cannot eat or drink without throwing up (vomiting).  You suddenly get a fever.  Your period is a lot heavier than usual. This information is not intended to replace advice given to you by your health care provider. Make sure you discuss any questions you have with your health care provider. Document Released: 07/10/2007 Document Revised: 08/11/2015 Document  Reviewed: 06/25/2015 Elsevier Interactive Patient Education  2017 [employer]

## 2017-01-15 NOTE — Progress Notes (Signed)
Heather Vargas 30-May-1980 [id number]    History:    Presents for new patient with a problem. Was seen at the ER 12/28/2016 and was found to have a 3 cm complex right ovarian cyst, hemorrhagic or endometrioma. States has had problems with cysts 4-5 times per year but last cyst caused debilitating pain that sent her to go to the ER. Requesting hysterectomy due to problems of the recurrent cysts.  Essure tubal ligation with confirmation on hysterosalpingogram. Has had an endometrial ablation 2015 for menorrhagia and is now amenorrheic done per Dr. Normand Sloopillard. Significant history - PE, morbid obesity, PCOS, hypertension. Primary care manages labs and meds. Last Pap 2017 normal.   Past medical history, past surgical history, family history and social history were all reviewed and documented in the EPIC chart. 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 Children's ages are 5917, 7815 and 4. Same partner for 7 years with negative STD screen.  ROS:  A ROS was performed and pertinent positives and negatives are included.  Exam:  Vitals:   01/15/17 0918  BP: 130/84  Weight: (!) 312 lb (141.5 kg)  Height: 5\' 4"  (1.626 m)   Body mass index is 53.55 kg/m.   General appearance:  Normal Thyroid:  Symmetrical, normal in size, without palpable masses or nodularity. Respiratory  Auscultation:  Clear without wheezing or rhonchi Cardiovascular  Auscultation:  Regular rate, without rubs, murmurs or gallops  Edema/varicosities:  Not grossly evident Abdominal  Soft,nontender, without masses, guarding or rebound.  Liver/spleen:  No organomegaly noted  Hernia:  None appreciated  Skin  Inspection:  Grossly normal   Breasts: Examined lying and sitting.     Right: Without masses, retractions, discharge or axillary adenopathy.     Left: Without masses, retractions, discharge or axillary adenopathy. Gentitourinary   Inguinal/mons:  Normal without inguinal adenopathy  External genitalia:  Normal  BUS/Urethra/Skene's glands:   Normal  Vagina: Frothy adherent discharge positive for Trichomonas  Cervix:  Normal  Uterus: normal in size, shape and contour.  Midline and mobile  Adnexa/parametria:     Rt: Without masses or tenderness.   Lt: Without masses or tenderness.  Anus and perineum: Normal  Digital rectal exam: Normal sphincter tone without palpated masses or tenderness  Assessment/Plan:  36 y.o. SBF G3 P3  for new patient problem visit for ovarian cyst follow-up and requesting hysterectomy.  3 cm right ovarian complex cyst History of PE  on baby aspirin daily Hypertension on anxiety and depression-primary care manages labs and meds HSV rare outbreaks Trichomonas Morbid obesity  Plan: Flagyl 2 g by mouth for both she and her partner. (Had negative GC/Chlamydia 12/2016.) SBE's, calcium rich diet, vitamin D 1000 daily encouraged. Reviewed importance of increasing exercise and decreasing calories for weight loss, briefly discussed weight loss surgery, return to office in February will repeat ultrasound. Reviewed best not to jump to surgery due to her numerous surgical risks, will assess at February ultrasound appointment. Pap normal 2017.      Harrington Challengerancy J Devinn Hurwitz [facility]NP, 11:23 AM 01/15/2017

## 2017-03-19 ENCOUNTER — Other Ambulatory Visit: Payer: BLUE CROSS/BLUE SHIELD

## 2017-03-19 ENCOUNTER — Ambulatory Visit: Payer: BLUE CROSS/BLUE SHIELD | Admitting: Women's Health

## 2017-03-19 DIAGNOSIS — Z0289 Encounter for other administrative examinations: Secondary | ICD-10-CM

## 2017-04-26 ENCOUNTER — Emergency Department (HOSPITAL_COMMUNITY): Payer: BLUE CROSS/BLUE SHIELD

## 2017-04-26 ENCOUNTER — Other Ambulatory Visit: Payer: Self-pay

## 2017-04-26 ENCOUNTER — Encounter (HOSPITAL_COMMUNITY): Payer: Self-pay | Admitting: Emergency Medicine

## 2017-04-26 ENCOUNTER — Emergency Department (HOSPITAL_COMMUNITY)
Admission: EM | Admit: 2017-04-26 | Discharge: 2017-04-26 | Disposition: A | Discharge status: Home / Self Care | Payer: BLUE CROSS/BLUE SHIELD | Attending: Emergency Medicine | Admitting: Emergency Medicine

## 2017-04-26 DIAGNOSIS — Z7982 Long term (current) use of aspirin: Secondary | ICD-10-CM | POA: Insufficient documentation

## 2017-04-26 DIAGNOSIS — R079 Chest pain, unspecified: Secondary | ICD-10-CM | POA: Diagnosis present

## 2017-04-26 DIAGNOSIS — Z79899 Other long term (current) drug therapy: Secondary | ICD-10-CM | POA: Insufficient documentation

## 2017-04-26 DIAGNOSIS — Z7984 Long term (current) use of oral hypoglycemic drugs: Secondary | ICD-10-CM | POA: Diagnosis not present

## 2017-04-26 DIAGNOSIS — I10 Essential (primary) hypertension: Secondary | ICD-10-CM | POA: Insufficient documentation

## 2017-04-26 LAB — BASIC METABOLIC PANEL
Anion gap: 7 (ref 5–15)
BUN: 11 mg/dL (ref 6–20)
CO2: 25 mmol/L (ref 22–32)
Calcium: 8.9 mg/dL (ref 8.9–10.3)
Chloride: 104 mmol/L (ref 101–111)
Creatinine, Ser: 0.92 mg/dL (ref 0.44–1.00)
GFR calc Af Amer: >60 mL/min (ref >60)
GFR calc non Af Amer: >60 mL/min (ref >60)
GLUCOSE: 101 mg/dL — AB (ref 65–99)
POTASSIUM: 3.8 mmol/L (ref 3.5–5.1)
Sodium: 136 mmol/L (ref 135–145)

## 2017-04-26 LAB — CBC
HCT: 39.5 % (ref 36.0–46.0)
Hemoglobin: 12.3 g/dL (ref 12.0–15.0)
MCH: 26.6 pg (ref 26.0–34.0)
MCHC: 31.1 g/dL (ref 30.0–36.0)
MCV: 85.3 fL (ref 78.0–100.0)
Platelets: 295 10*3/uL (ref 150–400)
RBC: 4.63 MIL/uL (ref 3.87–5.11)
RDW: 14.6 % (ref 11.5–15.5)
WBC: 4.4 10*3/uL (ref 4.0–10.5)

## 2017-04-26 LAB — I-STAT BETA HCG BLOOD, ED (MC, WL, AP ONLY): I-stat hCG, quantitative: <5 m[IU]/mL

## 2017-04-26 LAB — D-DIMER, QUANTITATIVE (NOT AT ARMC)

## 2017-04-26 LAB — I-STAT TROPONIN, ED: Troponin i, poc: 0 ng/mL (ref 0.00–0.08)

## 2017-04-26 NOTE — ED Provider Notes (Signed)
[facility] EMERGENCY DEPARTMENT Provider Note   CSN: [id number] Arrival date & time: 04/26/17  1045     History   Chief Complaint Chief Complaint  Patient presents with  . Chest Pain  . Shortness of Breath    HPI Heather Vargas is a 37 y.o. female.  The history is provided by the patient and medical records. No language interpreter was used.  Chest Pain   Pertinent negatives include no palpitations.  Shortness of Breath  Associated symptoms include chest pain. Pertinent negatives include no leg swelling.   Heather Vargas is a 37 y.o. female  with a PMH of HTN who presents to the Emergency Department complaining of sharp, central chest pain off and on for the last week.  Pain is worse when she takes a deep breath.  She also notes her chest being somewhat tender to palpation.  She did have sinus congestion and dry cough about 2-3 weeks ago.  This is now resolved.  She does have history of prior pulmonary embolus during pregnancy.  She was on Lovenox injections and then Coumadin for about 2 years.  She has been off of anticoagulation for several years now.  It was thought that blood clot was due to pregnancy.  She has not had any recent travel, surgeries or immobilizations.  She does report some bilateral calf pain, but states that she has been much more active and climbing up stairs a good bit recently.  She attributed calf soreness to muscle soreness.  Denies any shortness of breath.  Not on OCPs.    Past Medical History:  Diagnosis Date  . Anemia    during 1st pregnancy;iron supp taken  . Bacterial infection   . H/O migraine   . H/O varicella   . Headache(784.0)    Migraines;was on Topamax last year;no longer taking  . History of depression 09/10/2011   Prozac in the past  . History of PCOS 12/11/10  . History of polyhydramnios 09/10/2011   IOL for poly w/ G1 in 2002  . Hypertension    on Labetolol 100mg  bid  . Increased BMI 12/11/10  . Irregular  menstrual cycle 2012  . Morbidly obese (HCC) 09/10/2011  . NSVD (normal spontaneous vaginal delivery) 03/24/2012  . Ovarian cyst   . PMS (premenstrual syndrome) 04/09/11   was Rx'd Prozac  . Pregnancy 09/25/2011  . Pulmonary embolism (HCC) 2013   on Lovenox 130mg  bid  . Trichomonas    as a teen  . Vitamin D deficiency 12/11/10  . Yeast infection     Patient Active Problem List   Diagnosis Date Noted  . Personal history of venous thrombosis and embolism 04/15/2012  . NSVD (normal spontaneous vaginal delivery) 03/24/2012  . Nuchal cord, delivered, current hospitalization 03/24/2012  . Anemia 01/02/2012  . Desires tubal ligation 12/03/2011  . Pregnancy 09/25/2011  . Morbidly obese (HCC) 09/10/2011  . History of depression 09/10/2011  . History of polyhydramnios 09/10/2011  . PE (pulmonary embolism) 08/02/2011  . HTN (hypertension) 08/02/2011  . Chest pain on breathing 07/31/2011  . SOB (shortness of breath) 07/31/2011  . PCOS (polycystic ovarian syndrome) 07/31/2011    Past Surgical History:  Procedure Laterality Date  . DILITATION & CURRETTAGE/HYSTROSCOPY WITH THERMACHOICE ABLATION N/A 01/06/2014   Procedure: DILATATION & CURETTAGE/HYSTEROSCOPY WITH THERMACHOICE ABLATION;  Surgeon: Jaymes Graff, MD;  Location: [facility];  Service: Gynecology;  Laterality: N/A;  . HYSTEROSCOPY N/A 05/28/2012   Procedure: HYSTEROSCOPY;  Surgeon: Michael Litter,  MD;  Location: [facility];  Service: Gynecology;  Laterality: N/A;  . TOOTH EXTRACTION    . TUBAL LIGATION Bilateral 05/28/2012   Procedure: ESSURE TUBAL STERILIZATION;  Surgeon: Michael LitterNaima A Dillard, MD;  Location: [facility];  Service: Gynecology;  Laterality: Bilateral;     OB History    Gravida  3   Para  3   Term  3   Preterm      AB      Living  3     SAB      TAB      Ectopic      Multiple      Live Births  3            Home Medications    Prior to Admission medications   Medication Sig Start Date End Date Taking?  Authorizing Provider  aspirin 81 MG chewable tablet Chew 81 mg by mouth daily.   Yes [provider]  EVENING PRIMROSE OIL PO Take 1,300 mg by mouth daily.   Yes [provider]  FLUoxetine (PROZAC) 20 MG capsule Take 20 mg by mouth daily. 04/10/17  Yes [provider]  fluticasone (FLONASE) 50 MCG/ACT nasal spray Place 2 sprays into both nostrils daily as needed for allergies.    Yes [provider]  metFORMIN (GLUCOPHAGE-XR) 500 MG 24 hr tablet Take 500 mg by mouth daily.   Yes [provider]  valACYclovir (VALTREX) 500 MG tablet Take 500 mg by mouth daily as needed (outbreak).    Yes [provider]    Family History Family History  Problem Relation Age of Onset  . Hypertension Mother   . Hypertension Father   . Diabetes Maternal Grandmother   . Stroke Maternal Grandmother   . Thyroid disease Maternal Grandmother   . Cancer Paternal Grandmother        Colon  . Thyroid disease Paternal Grandmother   . Cancer Paternal Uncle        Prostate x 3  . Seizures Son        Epilelpsy    Social History Social History   Tobacco Use  . Smoking status: Never Smoker  . Smokeless tobacco: Never Used  Substance Use Topics  . Alcohol use: Yes    Comment: socially  . Drug use: No     Allergies   Patient has no known allergies.   Review of Systems Review of Systems  Cardiovascular: Positive for chest pain. Negative for palpitations and leg swelling.  All other systems reviewed and are negative.    Physical Exam Updated Vital Signs BP (!) 146/90   Pulse 88   Temp 97.8 F (36.6 C) (Oral)   Resp 20   Ht 5\' 2"  (1.575 m)   Wt (!) 143.3 kg (316 lb)   SpO2 100%   BMI 57.80 kg/m   Physical Exam  Constitutional: She is oriented to person, place, and time. She appears well-developed and well-nourished. No distress.  HENT:  Head: Normocephalic and atraumatic.  Cardiovascular: Normal rate, regular rhythm and normal heart  sounds.  No murmur heard. Pulmonary/Chest: Effort normal and breath sounds normal. No respiratory distress. She has no wheezes. She has no rales. She exhibits tenderness.  Abdominal: Soft. She exhibits no distension. There is no tenderness.  Musculoskeletal: She exhibits no edema.  Neurological: She is alert and oriented to person, place, and time.  Skin: Skin is warm and dry.  Nursing note and vitals reviewed.    ED  Treatments / Results  Labs (all labs ordered are listed, but only abnormal results are displayed) Labs Reviewed  BASIC METABOLIC PANEL - Abnormal; Notable for the following components:      Result Value   Glucose, Bld 101 (*)    All other components within normal limits  CBC  D-DIMER, QUANTITATIVE (NOT AT [facility])  I-STAT TROPONIN, ED  I-STAT BETA HCG BLOOD, ED ([facility], [facility], [facility] ONLY)    EKG EKG Interpretation  Date/Time:  Saturday April 26 2017 10:54:51 EDT Ventricular Rate:  74 PR Interval:  162 QRS Duration: 100 QT Interval:  378 QTC Calculation: 419 R Axis:   74 Text Interpretation:  Normal sinus rhythm Normal ECG No significant change since last tracing Confirmed by Frederick Peers [phone]) on 04/26/2017 12:03:37 PM   Radiology Dg Chest 2 View  Result Date: 04/26/2017 CLINICAL DATA:  Shortness of breath and chest pain. EXAM: CHEST - 2 VIEW COMPARISON:  July 31, 2011 FINDINGS: The heart size and mediastinal contours are within normal limits. Both lungs are clear. The visualized skeletal structures are unremarkable. IMPRESSION: No active cardiopulmonary disease. Electronically Signed   By: Gerome Sam III M.D   On: 04/26/2017 11:19    Procedures Procedures (including critical care time)  Medications Ordered in ED Medications - No data to display   Initial Impression / Assessment and Plan / ED Course  I have reviewed the triage vital signs and the nursing notes.  Pertinent labs & imaging results that were available during my care of the patient were  reviewed by me and considered in my medical decision making (see chart for details).    Heather Vargas is a 37 y.o. female who presents to ED for chest pain x 1 week worse with deep breathing. On exam, patient with tenderness to chest wall, but otherwise normal cardiopulmonary examination.   Labs reviewed and reassuring with negative troponin and negative d-dimer.  CXR with no acute abnormalities.  EKG unchanged from previous.  Heart score of 2, low risk.   Patient's symptoms unlikely to be of cardiac etiology. Labs and imaging reviewed again prior to discharge. Patient has been advised to return to the ED if development of any exertional chest pain, trouble breathing, new/worsening symptoms or for any additional concerns. Evaluation does not show pathology that would require ongoing emergent intervention or inpatient treatment. Encouraged to follow up with PCP. Referral information included in discharge paperwork. Patient understands return precautions and follow up plan. All questions answered.   Final Clinical Impressions(s) / ED Diagnoses   Final diagnoses:  Chest pain with low risk for cardiac etiology    ED Discharge Orders    None       Ward, Chase Picket, PA-C 04/26/17 1520    Little, Ambrose Finland, MD 04/29/17 1057

## 2017-04-26 NOTE — ED Notes (Signed)
Pt still in x-ray. Will be brought to room when finished

## 2017-04-26 NOTE — ED Triage Notes (Signed)
Pt. Stated, Heather Vargas had some chest pain for a week and hurts when I breath.

## 2017-04-26 NOTE — ED Notes (Signed)
Pt discharged from ED; instructions provided; Pt encouraged to return to ED if symptoms worsen and to f/u with PCP; Pt verbalized understanding of all instructions 

## 2017-04-26 NOTE — Discharge Instructions (Signed)
It was my pleasure taking care of you today!  ° °You were seen in the Emergency Department today for chest pain.  As we have discussed, today’s blood work and imaging are normal, but you may require further testing. ° °Please call your primary care physician to schedule a follow up appointment to discuss your ER visit today.  ° °Return to the Emergency Department if you experience any further chest pain/pressure/tightness, difficulty breathing, sudden sweating, or other symptoms that concern you. °

## 2018-01-19 ENCOUNTER — Other Ambulatory Visit: Payer: Self-pay | Admitting: Obstetrics and Gynecology

## 2018-01-19 DIAGNOSIS — Z1231 Encounter for screening mammogram for malignant neoplasm of breast: Secondary | ICD-10-CM

## 2018-02-16 ENCOUNTER — Other Ambulatory Visit: Payer: Self-pay

## 2018-02-16 ENCOUNTER — Emergency Department (HOSPITAL_COMMUNITY): Payer: BLUE CROSS/BLUE SHIELD

## 2018-02-16 ENCOUNTER — Encounter (HOSPITAL_COMMUNITY): Payer: Self-pay | Admitting: *Deleted

## 2018-02-16 DIAGNOSIS — I10 Essential (primary) hypertension: Secondary | ICD-10-CM | POA: Diagnosis not present

## 2018-02-16 DIAGNOSIS — Z79899 Other long term (current) drug therapy: Secondary | ICD-10-CM | POA: Diagnosis not present

## 2018-02-16 DIAGNOSIS — Z7982 Long term (current) use of aspirin: Secondary | ICD-10-CM | POA: Insufficient documentation

## 2018-02-16 DIAGNOSIS — R0602 Shortness of breath: Secondary | ICD-10-CM | POA: Diagnosis present

## 2018-02-16 MED ORDER — ALBUTEROL SULFATE (2.5 MG/3ML) 0.083% IN NEBU
5.0000 mg | INHALATION_SOLUTION | Freq: Once | RESPIRATORY_TRACT | Status: AC
  Administered 2018-02-17: 5 mg via RESPIRATORY_TRACT
  Filled 2018-02-16: qty 6

## 2018-02-16 NOTE — ED Triage Notes (Signed)
Pt states a few days of SHOB, worse today, has history of PE's and DVT's in June 2013. Has leg pain in rt leg today.

## 2018-02-17 ENCOUNTER — Emergency Department (HOSPITAL_COMMUNITY)
Admission: EM | Admit: 2018-02-17 | Discharge: 2018-02-17 | Disposition: A | Discharge status: Home / Self Care | Payer: BLUE CROSS/BLUE SHIELD | Attending: Emergency Medicine | Admitting: Emergency Medicine

## 2018-02-17 ENCOUNTER — Emergency Department (HOSPITAL_COMMUNITY): Payer: BLUE CROSS/BLUE SHIELD

## 2018-02-17 ENCOUNTER — Other Ambulatory Visit: Payer: Self-pay

## 2018-02-17 ENCOUNTER — Encounter (HOSPITAL_COMMUNITY): Payer: Self-pay

## 2018-02-17 DIAGNOSIS — R0602 Shortness of breath: Secondary | ICD-10-CM

## 2018-02-17 LAB — CBC WITH DIFFERENTIAL/PLATELET
ABS IMMATURE GRANULOCYTES: 0.01 10*3/uL (ref 0.00–0.07)
Basophils Absolute: 0 10*3/uL (ref 0.0–0.1)
Basophils Relative: 0 %
Eosinophils Absolute: 0.1 10*3/uL (ref 0.0–0.5)
Eosinophils Relative: 1 %
HEMATOCRIT: 40.5 % (ref 36.0–46.0)
Hemoglobin: 12.3 g/dL (ref 12.0–15.0)
IMMATURE GRANULOCYTES: 0 %
LYMPHS ABS: 2.7 10*3/uL (ref 0.7–4.0)
LYMPHS PCT: 52 %
MCH: 27.5 pg (ref 26.0–34.0)
MCHC: 30.4 g/dL (ref 30.0–36.0)
MCV: 90.6 fL (ref 80.0–100.0)
MONOS PCT: 5 %
Monocytes Absolute: 0.3 10*3/uL (ref 0.1–1.0)
NEUTROS ABS: 2.3 10*3/uL (ref 1.7–7.7)
NEUTROS PCT: 42 %
PLATELETS: 246 10*3/uL (ref 150–400)
RBC: 4.47 MIL/uL (ref 3.87–5.11)
RDW: 14.6 % (ref 11.5–15.5)
WBC: 5.4 10*3/uL (ref 4.0–10.5)
nRBC: 0 % (ref 0.0–0.2)

## 2018-02-17 LAB — D-DIMER, QUANTITATIVE: D-Dimer, Quant: 0.45 ug/mL-FEU (ref 0.00–0.50)

## 2018-02-17 LAB — BASIC METABOLIC PANEL
ANION GAP: 9 (ref 5–15)
BUN: 10 mg/dL (ref 6–20)
CO2: 23 mmol/L (ref 22–32)
Calcium: 8.9 mg/dL (ref 8.9–10.3)
Chloride: 105 mmol/L (ref 98–111)
Creatinine, Ser: 0.84 mg/dL (ref 0.44–1.00)
GFR calc Af Amer: >60 mL/min (ref >60)
GFR calc non Af Amer: >60 mL/min (ref >60)
GLUCOSE: 120 mg/dL — AB (ref 70–99)
POTASSIUM: 3.6 mmol/L (ref 3.5–5.1)
Sodium: 137 mmol/L (ref 135–145)

## 2018-02-17 LAB — I-STAT BETA HCG BLOOD, ED (NOT ORDERABLE): I-stat hCG, quantitative: <5 m[IU]/mL (ref <5)

## 2018-02-17 MED ORDER — IOPAMIDOL (ISOVUE-370) INJECTION 76%
INTRAVENOUS | Status: AC
  Filled 2018-02-17: qty 100

## 2018-02-17 MED ORDER — SODIUM CHLORIDE (PF) 0.9 % IJ SOLN
INTRAMUSCULAR | Status: AC
  Filled 2018-02-17: qty 50

## 2018-02-17 MED ORDER — ALBUTEROL SULFATE HFA 108 (90 BASE) MCG/ACT IN AERS
2.0000 | INHALATION_SPRAY | RESPIRATORY_TRACT | Status: DC | PRN
  Administered 2018-02-17: 2 via RESPIRATORY_TRACT
  Filled 2018-02-17: qty 6.7

## 2018-02-17 MED ORDER — AEROCHAMBER Z-STAT PLUS/MEDIUM MISC: 1.0000 | Freq: Once | Status: DC

## 2018-02-17 MED ORDER — IOPAMIDOL (ISOVUE-370) INJECTION 76%
100.0000 mL | Freq: Once | INTRAVENOUS | Status: AC | PRN
  Administered 2018-02-17: 100 mL via INTRAVENOUS

## 2018-02-17 NOTE — ED Provider Notes (Signed)
[facility] Provider Note: Heather Vargas Heather Woodberry, MD, FACEP  CSN: [id number] MRN: [id number] ARRIVAL: 02/16/18 at 1933 ROOM: WA10/WA10   CHIEF COMPLAINT  Shortness of Breath and Leg Pain (left)   HISTORY OF PRESENT ILLNESS  02/17/18 1:13 AM Heather Vargas is a 38 y.o. female with a remote history of thromboembolic disease in 2013.  She is here with a 5-day history of mild pain in her left calf and a 4-day history of shortness of breath.  The shortness of breath is moderate and worse with exertion.  She is not aware of wheezing.  She has had no chest pain or fever.    Past Medical History:  Diagnosis Date  . Anemia    during 1st pregnancy;iron supp taken  . Bacterial infection   . H/O migraine   . H/O varicella   . Headache(784.0)    Migraines;was on Topamax last year;no longer taking  . History of depression 09/10/2011   Prozac in the past  . History of PCOS 12/11/10  . History of polyhydramnios 09/10/2011   IOL for poly w/ G1 in 2002  . Hypertension    on Labetolol 100mg  bid  . Increased BMI 12/11/10  . Irregular menstrual cycle 2012  . Morbidly obese (HCC) 09/10/2011  . NSVD (normal spontaneous vaginal delivery) 03/24/2012  . Ovarian cyst   . PMS (premenstrual syndrome) 04/09/11   was Rx'd Prozac  . Pregnancy 09/25/2011  . Pulmonary embolism (HCC) 2013   on Lovenox 130mg  bid  . Trichomonas    as a teen  . Vitamin D deficiency 12/11/10  . Yeast infection     Past Surgical History:  Procedure Laterality Date  . DILITATION & CURRETTAGE/HYSTROSCOPY WITH THERMACHOICE ABLATION N/A 01/06/2014   Procedure: DILATATION & CURETTAGE/HYSTEROSCOPY WITH THERMACHOICE ABLATION;  Surgeon: Jaymes GraffNaima Dillard, MD;  Location: [facility];  Service: Gynecology;  Laterality: N/A;  . HYSTEROSCOPY N/A 05/28/2012   Procedure: HYSTEROSCOPY;  Surgeon: Michael LitterNaima A Dillard, MD;  Location: [facility];  Service: Gynecology;  Laterality: N/A;  . TOOTH EXTRACTION    . TUBAL LIGATION Bilateral 05/28/2012   Procedure:  ESSURE TUBAL STERILIZATION;  Surgeon: Michael LitterNaima A Dillard, MD;  Location: [facility];  Service: Gynecology;  Laterality: Bilateral;    Family History  Problem Relation Age of Onset  . Hypertension Mother   . Hypertension Father   . Diabetes Maternal Grandmother   . Stroke Maternal Grandmother   . Thyroid disease Maternal Grandmother   . Cancer Paternal Grandmother        Colon  . Thyroid disease Paternal Grandmother   . Cancer Paternal Uncle        Prostate x 3  . Seizures Son        Epilelpsy    Social History   Tobacco Use  . Smoking status: Never Smoker  . Smokeless tobacco: Never Used  Substance Use Topics  . Alcohol use: Yes    Comment: socially  . Drug use: No    Prior to Admission medications   Medication Sig Start Date End Date Taking? Authorizing Provider  aspirin 81 MG chewable tablet Chew 81 mg by mouth daily.   Yes [provider]  EVENING PRIMROSE OIL PO Take 1,300 mg by mouth daily.   Yes [provider]    Allergies Patient has no known allergies.   REVIEW OF SYSTEMS  Negative except as noted here or in the History of Present Illness.   PHYSICAL EXAMINATION  Initial Vital Signs Blood pressure Marland Kitchen(!)  144/93, pulse (!) 101, temperature 98.6 F (37 C), resp. rate 18, height 5\' 2"  (1.575 m), weight 125.6 kg, SpO2 99 %.  Examination General: Well-developed, well-nourished female in no acute distress; appearance consistent with age of record HENT: normocephalic; atraumatic Eyes: pupils equal, round and reactive to light; extraocular muscles intact Neck: supple Heart: regular rate and rhythm Lungs: Mildly diminished sounds without wheezing Abdomen: soft; nondistended; nontender; bowel sounds present Extremities: No deformity; full range of motion; pulses normal; no calf tenderness; no lower extremity edema Neurologic: Awake, alert and oriented; motor function intact in all extremities and symmetric; no facial droop Skin: Warm and  dry Psychiatric: Normal mood and affect   RESULTS  Summary of this visit's results, reviewed by myself:   EKG Interpretation  Date/Time:  Tuesday February 17 2018 01:25:33 EST Ventricular Rate:  88 PR Interval:    QRS Duration: 109 QT Interval:  375 QTC Calculation: 454 R Axis:   27 Text Interpretation:  Sinus rhythm Normal ECG No significant change was found Confirmed by Theophilus Walz, Jonny RuizJohn ([id number]) on 02/17/2018 1:27:56 AM      Laboratory Studies: Results for orders placed or performed during the hospital encounter of 02/17/18 (from the past 24 hour(s))  CBC with Differential/Platelet     Status: None   Collection Time: 02/17/18  1:55 AM  Result Value Ref Range   WBC 5.4 4.0 - 10.5 K/uL   RBC 4.47 3.87 - 5.11 MIL/uL   Hemoglobin 12.3 12.0 - 15.0 g/dL   HCT 19.140.5 47.836.0 - 29.546.0 %   MCV 90.6 80.0 - 100.0 fL   MCH 27.5 26.0 - 34.0 pg   MCHC 30.4 30.0 - 36.0 g/dL   RDW 62.114.6 30.811.5 - 65.715.5 %   Platelets 246 150 - 400 K/uL   nRBC 0.0 0.0 - 0.2 %   Neutrophils Relative % 42 %   Neutro Abs 2.3 1.7 - 7.7 K/uL   Lymphocytes Relative 52 %   Lymphs Abs 2.7 0.7 - 4.0 K/uL   Monocytes Relative 5 %   Monocytes Absolute 0.3 0.1 - 1.0 K/uL   Eosinophils Relative 1 %   Eosinophils Absolute 0.1 0.0 - 0.5 K/uL   Basophils Relative 0 %   Basophils Absolute 0.0 0.0 - 0.1 K/uL   Immature Granulocytes 0 %   Abs Immature Granulocytes 0.01 0.00 - 0.07 K/uL  Basic metabolic panel     Status: Abnormal   Collection Time: 02/17/18  1:55 AM  Result Value Ref Range   Sodium 137 135 - 145 mmol/L   Potassium 3.6 3.5 - 5.1 mmol/L   Chloride 105 98 - 111 mmol/L   CO2 23 22 - 32 mmol/L   Glucose, Bld 120 (H) 70 - 99 mg/dL   BUN 10 6 - 20 mg/dL   Creatinine, Ser 8.460.84 0.44 - 1.00 mg/dL   Calcium 8.9 8.9 - 96.210.3 mg/dL   GFR calc non Af Amer >60 >60 mL/min   GFR calc Af Amer >60 >60 mL/min   Anion gap 9 5 - 15  D-dimer, quantitative (not at [facility])     Status: None   Collection Time: 02/17/18  1:55 AM  Result  Value Ref Range   D-Dimer, Quant 0.45 0.00 - 0.50 ug/mL-FEU  I-Stat beta hCG blood, ED     Status: None   Collection Time: 02/17/18  3:13 AM  Result Value Ref Range   I-stat hCG, quantitative <5.0 <5 mIU/mL   Comment 3  Imaging Studies: Dg Chest 2 View  Result Date: 02/16/2018 CLINICAL DATA:  Shortness of Breath EXAM: CHEST - 2 VIEW COMPARISON:  April 26, 2017 FINDINGS: There is no edema or consolidation. The heart size and pulmonary vascularity are normal. No adenopathy. No bone lesions. IMPRESSION: No edema or consolidation. Electronically Signed   By: Bretta Bang III M.D.   On: 02/16/2018 20:31   Ct Angio Chest Pe W And/or Wo Contrast  Result Date: 02/17/2018 CLINICAL DATA:  Shortness of breath for a few days. Normal D-dimer. Previous history of pulmonary embolus and DVT in 2013. EXAM: CT ANGIOGRAPHY CHEST WITH CONTRAST TECHNIQUE: Multidetector CT imaging of the chest was performed using the standard protocol during bolus administration of intravenous contrast. Multiplanar CT image reconstructions and MIPs were obtained to evaluate the vascular anatomy. CONTRAST:  ISOVUE-370 IOPAMIDOL (ISOVUE-370) INJECTION 76% COMPARISON:  12/22/2012 FINDINGS: Cardiovascular: Moderately good opacification of the central and segmental pulmonary arteries. No focal filling defects. No evidence of significant pulmonary embolus. Normal heart size. No pericardial effusions. Normal caliber thoracic aorta. No aortic dissection. Great vessel origins are patent. Mediastinum/Nodes: Esophagus is decompressed. No significant lymphadenopathy in the chest. Lungs/Pleura: Motion artifact limits examination. No airspace disease or consolidation in the lungs. No pleural effusions. No pneumothorax. Airways are patent. Upper Abdomen: No acute abnormalities demonstrated. Musculoskeletal: No chest wall abnormality. No acute or significant osseous findings. Review of the MIP images confirms the above findings.  IMPRESSION: No evidence of significant pulmonary embolus. No evidence of active pulmonary disease. Electronically Signed   By: Burman Nieves M.D.   On: 02/17/2018 03:34    ED COURSE and MDM  Nursing notes and initial vitals signs, including pulse oximetry, reviewed.  Vitals:   02/16/18 1939 02/17/18 0129 02/17/18 0340  BP: (!) 144/93 (!) 144/99 131/88  Pulse: (!) 101 88 86  Resp: 18 (!) 22 10  Temp: 98.6 F (37 C) 98.6 F (37 C)   TempSrc:  Oral   SpO2: 99% 100% 100%  Weight: 125.6 kg    Height: 5\' 2"  (1.575 m)     4:20 AM CT negative for PE.  Will treat as bronchitis given diminished breath sounds despite no significant cough.  PROCEDURES    ED DIAGNOSES     ICD-10-CM   1. Shortness of breath R06.02        Paula Libra, MD 02/17/18 [phone]

## 2018-02-17 NOTE — ED Notes (Signed)
EKG done in triage, EKG was seen by Dr. Read Drivers after pt roomed.

## 2018-02-17 NOTE — ED Notes (Signed)
Pt transported to CT ?

## 2018-02-18 ENCOUNTER — Telehealth (HOSPITAL_COMMUNITY): Payer: Self-pay | Admitting: Psychiatry

## 2018-02-18 NOTE — Telephone Encounter (Signed)
D:  Pt phoned inquiring about MH-IOP.  Dr. Lovie Chol had referred pt.  A:  Placed call to pt, but there was no answer; left vm for her to call writer back.

## 2018-02-25 ENCOUNTER — Other Ambulatory Visit (HOSPITAL_COMMUNITY): Payer: BLUE CROSS/BLUE SHIELD | Attending: Psychiatry | Admitting: Licensed Clinical Social Worker

## 2018-02-25 ENCOUNTER — Encounter (HOSPITAL_COMMUNITY): Payer: Self-pay | Admitting: Psychiatry

## 2018-02-25 DIAGNOSIS — F322 Major depressive disorder, single episode, severe without psychotic features: Secondary | ICD-10-CM | POA: Insufficient documentation

## 2018-02-25 DIAGNOSIS — Z8249 Family history of ischemic heart disease and other diseases of the circulatory system: Secondary | ICD-10-CM | POA: Insufficient documentation

## 2018-02-25 DIAGNOSIS — Z818 Family history of other mental and behavioral disorders: Secondary | ICD-10-CM | POA: Diagnosis not present

## 2018-02-25 DIAGNOSIS — Z7982 Long term (current) use of aspirin: Secondary | ICD-10-CM | POA: Diagnosis not present

## 2018-02-25 DIAGNOSIS — F418 Other specified anxiety disorders: Secondary | ICD-10-CM | POA: Diagnosis not present

## 2018-02-25 DIAGNOSIS — I10 Essential (primary) hypertension: Secondary | ICD-10-CM | POA: Diagnosis not present

## 2018-02-25 DIAGNOSIS — Z79899 Other long term (current) drug therapy: Secondary | ICD-10-CM | POA: Diagnosis not present

## 2018-02-25 DIAGNOSIS — Z800 Family history of malignant neoplasm of digestive organs: Secondary | ICD-10-CM | POA: Diagnosis not present

## 2018-02-25 DIAGNOSIS — Z733 Stress, not elsewhere classified: Secondary | ICD-10-CM | POA: Diagnosis not present

## 2018-02-25 DIAGNOSIS — Z86711 Personal history of pulmonary embolism: Secondary | ICD-10-CM | POA: Insufficient documentation

## 2018-02-25 DIAGNOSIS — Z833 Family history of diabetes mellitus: Secondary | ICD-10-CM | POA: Diagnosis not present

## 2018-02-25 MED ORDER — DULOXETINE HCL 30 MG PO CPEP: 30.0000 mg | ORAL_CAPSULE | Freq: Every day | ORAL | 0 refills | Status: DC
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
## 2018-02-26 ENCOUNTER — Other Ambulatory Visit (HOSPITAL_COMMUNITY): Payer: BLUE CROSS/BLUE SHIELD | Admitting: Psychiatry

## 2018-02-26 ENCOUNTER — Telehealth (HOSPITAL_COMMUNITY): Payer: Self-pay | Admitting: Psychiatry

## 2018-02-26 NOTE — Progress Notes (Signed)
    Daily Group Progress Note  Program: IOP  Group Time: 9am-12pm  Participation Level: Active  Behavioral Response: Appropriate, Sharing and Motivated  Type of Therapy:  Group Therapy; process group, psycho-educational group  Summary of Progress:  The purpose of this group is to utilize CBT in a group setting to increase use of healthy coping skills and decrease intensity of active mental health symtpoms.  9am-10:30am Clinician checked in with clients, reviewing daily inventory and assessing for SI/HI/psychosis. Clinician presented 'I Am Poem' focused on identifying parts of self and areas of desired growth. Clinician and group members processed easy and difficult parts of completing activity and which automatic thoughts about self brought up feelings. Clinician provided clients with educational video on improving the feelings of self-worth by using self compassion to cope with self criticism. Clinician and group members processed automatic thoughts of self and from which experiences these were created. Clinician actively listened to clients providing summarizing statements and validating difficulty of identifying and addressing desired changes in self.  10:30am-12pm Clinician and group members reviewed types of self compassion excercises which can be added into daily life. Clinician and group members participated in progressive muscle relaxation skill to address feelings of anxiety. Clinician requested group members share a self-care activity planned for the rest of the day. Clinician reminded clients that self care includes making self a priority, not an afterthought which includes eating, drinking water, improving sleep hygiene.  Client completed intake and assessment with case manager and nurse practitioner, see notes for details. Client identifies wanting to work on improving self esteem and feelings of self worth. Client was able to verbalized when she needed to take a break due to feeling  overwhelmed and was receptive to mindfulness activity which she then identified as helpful.  Harlon Ditty, LCSW

## 2018-02-27 ENCOUNTER — Ambulatory Visit: Payer: BLUE CROSS/BLUE SHIELD

## 2018-02-27 ENCOUNTER — Encounter (HOSPITAL_COMMUNITY): Payer: Self-pay | Admitting: Family

## 2018-02-27 ENCOUNTER — Telehealth (HOSPITAL_COMMUNITY): Payer: Self-pay | Admitting: Psychiatry

## 2018-03-02 ENCOUNTER — Other Ambulatory Visit (HOSPITAL_COMMUNITY): Payer: BLUE CROSS/BLUE SHIELD | Admitting: Licensed Clinical Social Worker

## 2018-03-02 DIAGNOSIS — F322 Major depressive disorder, single episode, severe without psychotic features: Secondary | ICD-10-CM | POA: Diagnosis not present

## 2018-03-03 ENCOUNTER — Other Ambulatory Visit (HOSPITAL_COMMUNITY): Payer: BLUE CROSS/BLUE SHIELD | Admitting: Licensed Clinical Social Worker

## 2018-03-03 DIAGNOSIS — F322 Major depressive disorder, single episode, severe without psychotic features: Secondary | ICD-10-CM | POA: Diagnosis not present

## 2018-03-03 NOTE — Progress Notes (Signed)
    Daily Group Progress Note  Program: IOP  Group Time: 9am-12pm  Participation Level: Active  Behavioral Response: Appropriate  Type of Therapy:  Group Therapy  Summary of Progress:  The purpose of this group is to utilize CBT and DBT skills in a group setting to increase use of healthy coping skills and decrease frequency and intensity of active mental health symptoms.  9am-10:30am Clinician presented the topic of effective, assertive communication skills. Clinician provided activity focused on identifying common struggles with communicating, both talking and listening. Clinician praised client engagement in activity and processed with clients barriers to effective communication. Clinician reviewed and encouraged clients to practice using I-statements and [facility] DBT skill presented in handouts. Clinician highlighted assertive communication skills demonstrated in group by members.  10:30am-12pm Clinician presented Laughter Yoga as a healthy coping skill. Clinician and group members processed having mixed emotions based on multiple losses or related to a specific event. Clinician and group members processed different responses to grief and how what was acceptable to feel and show from emotions was learned. Clinician thanked group members for sharing and validated reported 'feelings hangover' following some groups.  Client participated in group discussions and activities. Client reports she is not likely to use laughter yoga as a skill and reported it was more difficult for her this day due to getting over a sickness and having some trouble breathing. Client inquired about effective communication with a narcissist. Client shared a highlight of her day was being around her children who make her laugh, and a plan for self care today is a plan to get her hair done.   Harlon Ditty, LCSW

## 2018-03-04 ENCOUNTER — Other Ambulatory Visit (HOSPITAL_COMMUNITY): Payer: BLUE CROSS/BLUE SHIELD | Admitting: Psychiatry

## 2018-03-04 ENCOUNTER — Encounter (HOSPITAL_COMMUNITY): Payer: Self-pay | Admitting: Family

## 2018-03-04 MED ORDER — TRAZODONE HCL 50 MG PO TABS: 50.0000 mg | ORAL_TABLET | Freq: Every day | ORAL | 0 refills | Status: DC

## 2018-03-04 NOTE — Addendum Note (Signed)
Addended by: Oneta Rack on: 03/04/2018 10:08 AM   Modules accepted: Orders

## 2018-03-05 ENCOUNTER — Telehealth (HOSPITAL_COMMUNITY): Payer: Self-pay | Admitting: Psychiatry

## 2018-03-05 ENCOUNTER — Other Ambulatory Visit (HOSPITAL_COMMUNITY): Payer: BLUE CROSS/BLUE SHIELD

## 2018-03-06 ENCOUNTER — Encounter (HOSPITAL_COMMUNITY): Payer: Self-pay

## 2018-03-06 ENCOUNTER — Other Ambulatory Visit (HOSPITAL_COMMUNITY): Payer: BLUE CROSS/BLUE SHIELD | Admitting: Family

## 2018-03-06 DIAGNOSIS — F322 Major depressive disorder, single episode, severe without psychotic features: Secondary | ICD-10-CM

## 2018-03-06 MED ORDER — TRAZODONE HCL 50 MG PO TABS: 25.0000 mg | ORAL_TABLET | Freq: Every day | ORAL | 0 refills | Status: DC

## 2018-03-06 NOTE — Progress Notes (Signed)
Discussed decreasing Trazodone 50 mg to 25 m g po QHS due to over sedation and feeling "groggy"  in the morning.  Patient reports she was battling cold like symptoms and will attempt to take trazodone 50 mg again over the weekend. NP will make 25 mg available. Support, encouragement and reassurance was provided.

## 2018-03-07 NOTE — Progress Notes (Signed)
    Daily Group Progress Note  Program: IOP  Group Time: 9am-12pm  Participation Level: Active  Behavioral Response: Appropriate  Type of Therapy:  Group Therapy; process group, pscyho-educational group  Summary of Progress:  The purpose of this group is to utilize CBT and DBT skills in a group setting to increase use of healthy coping skills and decrease frequency and intensity of active mental health symptoms.  9am-10:30am Clinician checked in with group members, inquiring about something that went well yesterday and something that could have gone better. Clinician and group members reviewed topics of the week, including boundary setting, assertive communication, distorted thinking, and goal setting. Clinician presented examples of effective, assertive communication statements. Clinician and group members discussed the importance of practicing I-statements regularly in order to gain mastery over skill. Clinician and group members role played in group scenarios where alternative phrases could be used. Group members processed the importance of timing, tone, and phrasing in conversations and how things are received. Clinician and group members reviewed previous GIVE and FAST skills which encourage communication skills based on intended outcome of interaction. Clinician allowed group members to process several examples of interactions between significant others and feedback for alternative statements.  10:30am-12pm Clinician presented list of Coping Skills. Clinician and group members discussed the importance of adding some activities into every day life to help manage levels of uncomfortable emotions. Clinician and group members discussed which skills were most helpful based on which 'level' of emotional they were feeling at the moment. Clients processed differences in roles in family and community systems which gave added pressure to appropriate behaviors. Clients picked coping skill to attempt over  the weekend. Client presented to group tearful stating she was already feeling overwhelmed from the morning. Client requested to participated minimally for the first part of group. Client was able to successfully demonstrate using I-statements to identify feelings and needs. Client was receptive to praise from clinician. Client later participated in conversations related to communicating with significant others and provided feedback to group members.   Harlon Ditty, LCSW

## 2018-03-07 NOTE — Progress Notes (Signed)
    Daily Group Progress Note  Program: IOP  Group Time: 9am-12pm  Participation Level: Active  Behavioral Response: Appropriate  Type of Therapy:  Group Therapy; psycho-educational group, process group  Summary of Progress:  The purpose of this group is to utilize CBT skills in a group setting to increase use of healthy coping skills and decrease frequency or intensity of active mental health symptoms.  9am-10:30am Clinician presented psycho-educational information on traits of rigid, porous, and healthy boundaries. Group members processed examples of types of boundaries including physical, intellectual, emotional, sexual, material, and time boundaries. Clinician and group members processed which boundaries can be put in place for each, and the barriers to maintaining boundaries.  10:30am-12pm Clinician and group members reviewed Tips for Setting Healthy Boundaries including identifying personal values and verbalizing boundaries to others. Clinician and group members practiced 'How to establish healthy boundaries' in session. Clinician praised client use of creativity when setting boundaries and validated feelings of frustration when having to continually re-enforce boundaries to those who are not respectful. Client shares struggle to maintain healthy boundaries with ex due to his lack of boundaries and poor communication. Client reports she has improved on boundary setting with family members which she is proud of. Client reports also being fairly good at setting and maintaining boundaries for children.  Harlon Ditty, LCSW

## 2018-03-09 ENCOUNTER — Other Ambulatory Visit (HOSPITAL_COMMUNITY): Payer: BLUE CROSS/BLUE SHIELD | Attending: Psychiatry | Admitting: Licensed Clinical Social Worker

## 2018-03-09 DIAGNOSIS — F411 Generalized anxiety disorder: Secondary | ICD-10-CM | POA: Insufficient documentation

## 2018-03-09 DIAGNOSIS — F322 Major depressive disorder, single episode, severe without psychotic features: Secondary | ICD-10-CM | POA: Insufficient documentation

## 2018-03-10 ENCOUNTER — Other Ambulatory Visit (HOSPITAL_COMMUNITY): Payer: BLUE CROSS/BLUE SHIELD | Admitting: Licensed Clinical Social Worker

## 2018-03-10 DIAGNOSIS — F322 Major depressive disorder, single episode, severe without psychotic features: Secondary | ICD-10-CM

## 2018-03-10 NOTE — Progress Notes (Signed)
    Daily Group Progress Note  Program: IOP  Group Time: 9am-12pm  Participation Level: Active  Behavioral Response: Appropriate, Sharing and Motivated  Type of Therapy: Group Therapy; process group, psycho-educational group ? Summary of Progress:  The purpose of this group is to utilize CBT and DBT skills in a group setting to increase use of healthy coping skills and decrease frequency and intensity of active mental health symptoms.  9am-11am Clinician checked in with clients and processed recent stressors and skills used. Clinician actively listened to clients and validated feelings. Clinician provided some reframing and redirecting. Clinician utilized summarizing statements and Socratic questioning learned in previous session to challenge clients thoughts and opportunities for skills use.  11am-12pm Clinician presented STOPP skill to address some ruminating thoughts and allow a mindful moment for clients to pause and to respond rather than react. Clinician praised clients use of I-statements to identify related thoughts and feelings and connection with previous behaviors and values. Client participated in all group discussions and activities. Client is receptive to feedback from group members and provides supportive feedback to others. Client shows progress toward goals as evidence by accepting invitation to lunch despite desire to isolate. Client reports having improved mood this day and presents with bright affect, utilizing I-statements to identify thoughts and feelings. Client also shared she maintained a boundary prior to group over the phone by not allowing someone to speak to her disrespectfully.   Harlon Ditty, LCSW

## 2018-03-10 NOTE — Progress Notes (Signed)
    Daily Group Progress Note  Program: IOP  Group Time: 9am-12pm  Participation Level: Active  Behavioral Response: Appropriate, Sharing and Motivated  Type of Therapy: Group Therapy; process group, psycho-educational group ? Summary of Progress:  The purpose of this group is to utilize CBT and DBT skills in a group setting to increase use of healthy coping skills and decrease frequency and intensity of active mental health symptoms.  9am-11am Clinician checked in with group members, assessing for SI/HI/psychosis, and any recent highs, lows, and skills used. Clinician and group members processed progress in therapy so far, and barriers to improvements. Clinician validated client feelings of frustration and some struggles with feeling a lack of support. Clinician facilitated discussion on the effects of distorted thinking on communication styles, relationships, boundaries, and expectations of others. Clinician reviewed Mind Traps from previous group to review common thought distortions and reviewed the connection between thoughts, feelings, and behaviors.  11am-12pm Clinician presented information on challenging negative thoughts to decrease negative self talk and increased symptoms of anxiety, depression, and irritability. Clinician presented questions for use in Socratic Questioning to provide clients with questions to challenge negative thoughts. Clinician and group members role played addressing examples of unhelpful thoughts in group. Client described "picking fights" to push people away and how she would like to avoid this interaction.  Client was encouraged to explore her limits of vulnerability and the effect it could have on potential relationships. She was also provided literature to assist her in combating mental traps such as "all or nothing thinking", "mind reading" and emotional reasoning. Client was casually dressed and appeared open. Client was open to being vulnerable and  discussing her concerns with her maladaptive communication and coping skills. Client has shown a higher level of openness to the skills provided to her. Client seems apprehensive about her ability to change and encouragement during the session.  Harlon DittyKarissa A Jaydence Arnesen, LCSW

## 2018-03-11 ENCOUNTER — Other Ambulatory Visit (HOSPITAL_COMMUNITY): Payer: BLUE CROSS/BLUE SHIELD | Admitting: Licensed Clinical Social Worker

## 2018-03-11 DIAGNOSIS — F322 Major depressive disorder, single episode, severe without psychotic features: Secondary | ICD-10-CM

## 2018-03-12 ENCOUNTER — Other Ambulatory Visit (HOSPITAL_COMMUNITY): Payer: BLUE CROSS/BLUE SHIELD | Admitting: Licensed Clinical Social Worker

## 2018-03-12 DIAGNOSIS — F322 Major depressive disorder, single episode, severe without psychotic features: Secondary | ICD-10-CM

## 2018-03-12 NOTE — Progress Notes (Signed)
    Daily Group Progress Note  Program: IOP  Group Time: 9am-12pm  Participation Level: Active  Behavioral Response: Appropriate and Sharing  Type of Therapy:  Group Therapy; process group, psycho-educational and  skills group  Summary of Progress:  The purpose of this group is to utilize CBT and DBT skills in group setting to increase use of healthy coping skills and decrease frequency or intensity of active mental health symptoms.  9am-11am Clinician presented the Mindfulness topic of Letting Go of Emotional Suffering and 'riding the wave' of an emotion. Clinician and group members discussed the steps of observing emotion, experiencing the emotion, note judging the emotions, and remembering there is not always a need to act on the emotion. Clients processed where they felt different feelings in their body and using questions starting with "what" rather than "why" to investigate emotions and triggering thoughts or events. Clinician presented clients [employer] to help manage overwhelming uncomfortable emotions and demonstrated in session in addition to 'Life Savers' review of mindful activities to focus on positive. Clinician and group members processed the difficulties with setting boundaries with resistant family members, as this often causes mixed emotions. Group members discussed how to address uncomfortable or conflicting emotions, including celebrating self and acknowledging high expectations while accepting family members behaviors might not meet them.  11am-12pm Clinician and group members participated in yoga activity. Client participated in group discussions and activities, finding yoga relaxing. Client shared disappointment with trying to set boundaries with family members as they often respond disrespectfully and 'punish' her children as well as her for disagreements. Client shared she is often frustrated when people do not provide support or celebrate for her when she helps  others, however acknowledged that she has not been specifically verbal about her needs, and does not enforce boundaries strictly with family members. Client continues to struggle with managing expectations of others and allowing herself to 'sit' in uncomfortable emotions for extended periods of time. Client notes she did attempt to use skills the previous night when she woke up to distract herself from ruminating thoughts however did not find mediation of breathing skills effective at the time.  Harlon DittyKarissa A Brone, LCSW

## 2018-03-12 NOTE — Progress Notes (Signed)
    Daily Group Progress Note  Program: IOP  Group Time: 9am-12pm  Participation Level: Active  Behavioral Response: Appropriate, Sharing and Motivated  Type of Therapy: Group Therapy; process group, psycho-educational group  Summary of Progress:  The purpose of this group is to utilize CBT and DBT skills in a group setting to increase use of healthy coping skills and decrease frequency and intensity of active mental health symptoms.  9am-10:30 Clinician facilitated process group on anxiety related to upcoming changes, such as program discharge and returning to work. Clinician actively listened to group members using reflective statements and prompting use of socratic questions. Clinician validaded group member concerns surrounding transitions and encouraged group members to review grounding skills prior to returning to stressful situations.  10:30am-12pm Clinician provided psycho-educational information related to mindfulness and relationship between emotions and eating. Clinician and group members brainstormed options for other coping skills to use in place of eating, and the benefits of mindful eating. Clinician presented the skill of EFT or Tapping, focused on acknowledging problems and emotions, and showing self compassion and acceptance. Client participated in group discussion and activities. Client did not find tapping effective or a likely coping skill to use. Client verbalized continuing to use her assertive communication skills both inside and outside of the group setting.   Harlon Ditty, LCSW

## 2018-03-13 ENCOUNTER — Ambulatory Visit (INDEPENDENT_AMBULATORY_CARE_PROVIDER_SITE_OTHER): Payer: BLUE CROSS/BLUE SHIELD | Admitting: Psychiatry

## 2018-03-13 ENCOUNTER — Other Ambulatory Visit (HOSPITAL_COMMUNITY): Payer: BLUE CROSS/BLUE SHIELD | Admitting: Licensed Clinical Social Worker

## 2018-03-13 DIAGNOSIS — F331 Major depressive disorder, recurrent, moderate: Secondary | ICD-10-CM | POA: Insufficient documentation

## 2018-03-13 DIAGNOSIS — F411 Generalized anxiety disorder: Secondary | ICD-10-CM | POA: Insufficient documentation

## 2018-03-13 DIAGNOSIS — F322 Major depressive disorder, single episode, severe without psychotic features: Secondary | ICD-10-CM

## 2018-03-13 MED ORDER — DULOXETINE HCL 60 MG PO CPEP: 60.0000 mg | ORAL_CAPSULE | Freq: Every day | ORAL | 0 refills | Status: DC

## 2018-03-13 MED ORDER — TRAZODONE HCL 100 MG PO TABS: 100.0000 mg | ORAL_TABLET | Freq: Every day | ORAL | 0 refills | Status: DC
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
## 2018-03-13 NOTE — Patient Instructions (Signed)
D:  Patient successfully completed MH-IOP today.  A:  Discharged pt today.  Follow up with Dr. Hinton Dyer on 03-16-18 2 1pm and Dr. Lovie Chol on 03-18-18 @ 11 a.m.  Pt will RTW on 03-23-18 without any restrictions.  R:  Patient receptive.
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
## 2018-03-16 ENCOUNTER — Encounter (HOSPITAL_COMMUNITY): Payer: Self-pay | Admitting: Psychiatry

## 2018-03-16 ENCOUNTER — Ambulatory Visit (HOSPITAL_COMMUNITY): Payer: BLUE CROSS/BLUE SHIELD | Admitting: Psychiatry

## 2018-03-16 ENCOUNTER — Ambulatory Visit (INDEPENDENT_AMBULATORY_CARE_PROVIDER_SITE_OTHER): Payer: BLUE CROSS/BLUE SHIELD | Admitting: Psychiatry

## 2018-03-16 ENCOUNTER — Other Ambulatory Visit (HOSPITAL_COMMUNITY): Payer: BLUE CROSS/BLUE SHIELD

## 2018-03-16 VITALS — Ht 62.0 in | Wt 311.0 lb

## 2018-03-16 DIAGNOSIS — F331 Major depressive disorder, recurrent, moderate: Secondary | ICD-10-CM | POA: Diagnosis not present

## 2018-03-16 DIAGNOSIS — F411 Generalized anxiety disorder: Secondary | ICD-10-CM | POA: Diagnosis not present
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
## 2018-03-16 NOTE — Progress Notes (Signed)
    Daily Group Progress Note  Program: IOP  Group Time: 9am-12pm  Participation Level: Active  Behavioral Response: Appropriate, Sharing and Motivated  Type of Therapy: Group Therapy; process group, psycho-educational group ? Summary of Progress:  The purpose of this group is to utilize CBT and DBT skills in a group setting to increase use of healthy coping skills and decrease frequency and intensity of distressing mental health symptoms.  9am-10:30am Clinician checked in with group members, assessing for SI/HI/psychosis and overall level of functioning. Clinician inquired about completion of self care activity from the previous day. Clinician presented 'I am...' poem for clients to complete and discuss focusing on improving self awareness. Clinician facilitated process discussion related to relationship dynamics based on perspective of boundaries and values.  10:30am-12pm Clinician provided values clarification activity. Clinician and group members created crisis plans, to identify triggers, coping skills, and what supports are available and how they could be most helpful. Client completes IOP on this day, see case management note for step-down plan. Client reports she has gained skills to address setting boundaries as well as some coping skills for her anxiety symptoms.  Harlon Ditty, LCSW

## 2018-03-17 ENCOUNTER — Other Ambulatory Visit (HOSPITAL_COMMUNITY): Payer: BLUE CROSS/BLUE SHIELD

## 2018-03-18 ENCOUNTER — Other Ambulatory Visit (HOSPITAL_COMMUNITY): Payer: BLUE CROSS/BLUE SHIELD

## 2018-03-19 ENCOUNTER — Other Ambulatory Visit (HOSPITAL_COMMUNITY): Payer: BLUE CROSS/BLUE SHIELD

## 2018-03-20 ENCOUNTER — Other Ambulatory Visit (HOSPITAL_COMMUNITY): Payer: BLUE CROSS/BLUE SHIELD

## 2018-03-23 ENCOUNTER — Other Ambulatory Visit (HOSPITAL_COMMUNITY): Payer: BLUE CROSS/BLUE SHIELD

## 2018-03-24 ENCOUNTER — Other Ambulatory Visit (HOSPITAL_COMMUNITY): Payer: BLUE CROSS/BLUE SHIELD

## 2018-03-25 ENCOUNTER — Other Ambulatory Visit (HOSPITAL_COMMUNITY): Payer: BLUE CROSS/BLUE SHIELD

## 2018-03-26 ENCOUNTER — Other Ambulatory Visit (HOSPITAL_COMMUNITY): Payer: BLUE CROSS/BLUE SHIELD

## 2018-03-27 ENCOUNTER — Other Ambulatory Visit (HOSPITAL_COMMUNITY): Payer: BLUE CROSS/BLUE SHIELD

## 2018-03-30 ENCOUNTER — Other Ambulatory Visit (HOSPITAL_COMMUNITY): Payer: BLUE CROSS/BLUE SHIELD

## 2018-03-31 ENCOUNTER — Other Ambulatory Visit (HOSPITAL_COMMUNITY): Payer: BLUE CROSS/BLUE SHIELD

## 2018-04-01 ENCOUNTER — Other Ambulatory Visit (HOSPITAL_COMMUNITY): Payer: BLUE CROSS/BLUE SHIELD

## 2018-04-02 ENCOUNTER — Other Ambulatory Visit (HOSPITAL_COMMUNITY): Payer: BLUE CROSS/BLUE SHIELD

## 2018-04-03 ENCOUNTER — Other Ambulatory Visit (HOSPITAL_COMMUNITY): Payer: BLUE CROSS/BLUE SHIELD

## 2018-04-06 ENCOUNTER — Other Ambulatory Visit (HOSPITAL_COMMUNITY): Payer: BLUE CROSS/BLUE SHIELD

## 2018-04-06 ENCOUNTER — Encounter (HOSPITAL_COMMUNITY): Payer: Self-pay | Admitting: Psychiatry

## 2018-04-06 ENCOUNTER — Ambulatory Visit (INDEPENDENT_AMBULATORY_CARE_PROVIDER_SITE_OTHER): Payer: BLUE CROSS/BLUE SHIELD | Admitting: Psychiatry

## 2018-04-06 VITALS — BP 137/90 | HR 100 | Ht 62.0 in | Wt 313.0 lb

## 2018-04-06 DIAGNOSIS — F411 Generalized anxiety disorder: Secondary | ICD-10-CM

## 2018-04-06 DIAGNOSIS — F331 Major depressive disorder, recurrent, moderate: Secondary | ICD-10-CM | POA: Diagnosis not present

## 2018-04-06 MED ORDER — BUPROPION HCL ER (SR) 150 MG PO TB12: 150.0000 mg | ORAL_TABLET | Freq: Every day | ORAL | 0 refills | Status: DC

## 2018-04-06 MED ORDER — DULOXETINE HCL 60 MG PO CPEP: 60.0000 mg | ORAL_CAPSULE | Freq: Every day | ORAL | 0 refills | Status: DC

## 2018-04-06 MED ORDER — TRAZODONE HCL 100 MG PO TABS: 100.0000 mg | ORAL_TABLET | Freq: Every day | ORAL | 0 refills | Status: DC

## 2018-04-06 NOTE — Patient Instructions (Signed)
Plan:  1. Continue please to take duloxetine and trazodone as you were.  2. We are adding slow release bupropion (Wellbutrin) in the morning to augment effectiveness of duloxetine (and counteract certain adverse effects of the latter). If you forget the morning dose to not take it late in the day, just restart the next day.
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
## 2018-04-07 ENCOUNTER — Other Ambulatory Visit (HOSPITAL_COMMUNITY): Payer: BLUE CROSS/BLUE SHIELD

## 2018-04-08 ENCOUNTER — Other Ambulatory Visit (HOSPITAL_COMMUNITY): Payer: BLUE CROSS/BLUE SHIELD

## 2018-04-09 ENCOUNTER — Other Ambulatory Visit (HOSPITAL_COMMUNITY): Payer: BLUE CROSS/BLUE SHIELD

## 2018-04-10 ENCOUNTER — Other Ambulatory Visit (HOSPITAL_COMMUNITY): Payer: BLUE CROSS/BLUE SHIELD

## 2018-04-13 ENCOUNTER — Other Ambulatory Visit (HOSPITAL_COMMUNITY): Payer: BLUE CROSS/BLUE SHIELD

## 2018-04-14 ENCOUNTER — Other Ambulatory Visit (HOSPITAL_COMMUNITY): Payer: BLUE CROSS/BLUE SHIELD

## 2018-04-15 ENCOUNTER — Other Ambulatory Visit (HOSPITAL_COMMUNITY): Payer: BLUE CROSS/BLUE SHIELD

## 2018-04-16 ENCOUNTER — Other Ambulatory Visit (HOSPITAL_COMMUNITY): Payer: BLUE CROSS/BLUE SHIELD

## 2018-04-17 ENCOUNTER — Other Ambulatory Visit (HOSPITAL_COMMUNITY): Payer: BLUE CROSS/BLUE SHIELD

## 2018-04-20 ENCOUNTER — Other Ambulatory Visit (HOSPITAL_COMMUNITY): Payer: BLUE CROSS/BLUE SHIELD

## 2018-04-21 ENCOUNTER — Other Ambulatory Visit (HOSPITAL_COMMUNITY): Payer: BLUE CROSS/BLUE SHIELD

## 2018-05-04 ENCOUNTER — Telehealth (INDEPENDENT_AMBULATORY_CARE_PROVIDER_SITE_OTHER): Payer: BLUE CROSS/BLUE SHIELD | Admitting: Psychiatry

## 2018-05-04 ENCOUNTER — Other Ambulatory Visit: Payer: Self-pay

## 2018-05-04 DIAGNOSIS — F411 Generalized anxiety disorder: Secondary | ICD-10-CM | POA: Diagnosis not present

## 2018-05-04 DIAGNOSIS — F330 Major depressive disorder, recurrent, mild: Secondary | ICD-10-CM

## 2018-05-04 MED ORDER — TRAZODONE HCL 100 MG PO TABS: 100.0000 mg | ORAL_TABLET | Freq: Every day | ORAL | 0 refills | Status: DC

## 2018-05-04 MED ORDER — BUPROPION HCL ER (SR) 150 MG PO TB12: 150.0000 mg | ORAL_TABLET | Freq: Two times a day (BID) | ORAL | 0 refills | Status: DC

## 2018-05-04 MED ORDER — DULOXETINE HCL 60 MG PO CPEP: 60.0000 mg | ORAL_CAPSULE | Freq: Every day | ORAL | 0 refills | Status: DC
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
## 2018-07-20 ENCOUNTER — Other Ambulatory Visit: Payer: Self-pay

## 2018-07-20 ENCOUNTER — Ambulatory Visit (INDEPENDENT_AMBULATORY_CARE_PROVIDER_SITE_OTHER): Payer: BC Managed Care – PPO | Admitting: Psychiatry

## 2018-07-20 DIAGNOSIS — F3181 Bipolar II disorder: Secondary | ICD-10-CM | POA: Insufficient documentation

## 2018-07-20 DIAGNOSIS — F411 Generalized anxiety disorder: Secondary | ICD-10-CM | POA: Diagnosis not present

## 2018-07-20 DIAGNOSIS — F3281 Premenstrual dysphoric disorder: Secondary | ICD-10-CM | POA: Diagnosis not present

## 2018-07-20 DIAGNOSIS — F3341 Major depressive disorder, recurrent, in partial remission: Secondary | ICD-10-CM

## 2018-07-20 DIAGNOSIS — F331 Major depressive disorder, recurrent, moderate: Secondary | ICD-10-CM | POA: Insufficient documentation

## 2018-07-20 MED ORDER — BUPROPION HCL ER (SR) 150 MG PO TB12: 150.0000 mg | ORAL_TABLET | Freq: Two times a day (BID) | ORAL | 0 refills | Status: DC

## 2018-07-20 MED ORDER — TRAZODONE HCL 100 MG PO TABS: 200.0000 mg | ORAL_TABLET | Freq: Every evening | ORAL | 0 refills | Status: DC | PRN

## 2018-07-20 MED ORDER — DULOXETINE HCL 60 MG PO CPEP: 60.0000 mg | ORAL_CAPSULE | Freq: Every day | ORAL | 0 refills | Status: DC
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
## 2018-09-16 ENCOUNTER — Ambulatory Visit (INDEPENDENT_AMBULATORY_CARE_PROVIDER_SITE_OTHER): Payer: BC Managed Care – PPO | Admitting: Psychiatry

## 2018-09-16 ENCOUNTER — Other Ambulatory Visit: Payer: Self-pay

## 2018-09-16 DIAGNOSIS — F3341 Major depressive disorder, recurrent, in partial remission: Secondary | ICD-10-CM

## 2018-09-16 DIAGNOSIS — F4323 Adjustment disorder with mixed anxiety and depressed mood: Secondary | ICD-10-CM | POA: Diagnosis not present

## 2018-09-16 DIAGNOSIS — F411 Generalized anxiety disorder: Secondary | ICD-10-CM | POA: Diagnosis not present

## 2018-09-16 MED ORDER — BUPROPION HCL ER (SR) 150 MG PO TB12: 150.0000 mg | ORAL_TABLET | Freq: Two times a day (BID) | ORAL | 0 refills | Status: DC

## 2018-09-16 MED ORDER — DULOXETINE HCL 30 MG PO CPEP: 90.0000 mg | ORAL_CAPSULE | Freq: Every day | ORAL | 0 refills | Status: DC

## 2018-09-16 MED ORDER — CYPROHEPTADINE HCL 4 MG PO TABS: 4.0000 mg | ORAL_TABLET | Freq: Every day | ORAL | 0 refills | Status: DC

## 2018-09-16 MED ORDER — TRAZODONE HCL 100 MG PO TABS: 200.0000 mg | ORAL_TABLET | Freq: Every evening | ORAL | 0 refills | Status: DC | PRN
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
## 2018-10-17 ENCOUNTER — Other Ambulatory Visit (HOSPITAL_COMMUNITY): Payer: Self-pay | Admitting: Psychiatry

## 2018-10-18 ENCOUNTER — Other Ambulatory Visit (HOSPITAL_COMMUNITY): Payer: Self-pay | Admitting: Psychiatry

## 2018-10-20 ENCOUNTER — Other Ambulatory Visit: Payer: Self-pay

## 2018-10-20 ENCOUNTER — Ambulatory Visit (INDEPENDENT_AMBULATORY_CARE_PROVIDER_SITE_OTHER): Payer: BC Managed Care – PPO | Admitting: Psychiatry

## 2018-10-20 DIAGNOSIS — F3341 Major depressive disorder, recurrent, in partial remission: Secondary | ICD-10-CM

## 2018-10-20 DIAGNOSIS — F411 Generalized anxiety disorder: Secondary | ICD-10-CM | POA: Diagnosis not present

## 2018-10-20 MED ORDER — TRAZODONE HCL 100 MG PO TABS: 200.0000 mg | ORAL_TABLET | Freq: Every evening | ORAL | 0 refills | Status: DC | PRN

## 2018-10-20 MED ORDER — CYPROHEPTADINE HCL 4 MG PO TABS: 4.0000 mg | ORAL_TABLET | Freq: Every day | ORAL | 0 refills | Status: DC | PRN

## 2018-10-20 MED ORDER — BUPROPION HCL ER (SR) 150 MG PO TB12: 150.0000 mg | ORAL_TABLET | Freq: Two times a day (BID) | ORAL | 0 refills | Status: DC

## 2018-10-20 MED ORDER — DULOXETINE HCL 30 MG PO CPEP: 90.0000 mg | ORAL_CAPSULE | Freq: Every day | ORAL | 0 refills | Status: DC
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
## 2018-11-19 ENCOUNTER — Other Ambulatory Visit: Payer: Self-pay | Admitting: Obstetrics and Gynecology

## 2018-11-19 DIAGNOSIS — Z1231 Encounter for screening mammogram for malignant neoplasm of breast: Secondary | ICD-10-CM

## 2018-11-20 ENCOUNTER — Telehealth (HOSPITAL_COMMUNITY): Payer: Self-pay

## 2018-11-20 NOTE — Telephone Encounter (Signed)
[place] patient, patient is calling because she says she is not sleeping. Patient said she is not doing well right now and really needs sleep. Please review and advise, thank you

## 2018-11-21 NOTE — Telephone Encounter (Signed)
I reviewed her notes.  She is taking Cymbalta, trazodone and recently Wellbutrin added.  She should try cutting down her Wellbutrin and just to take 1 tablet in the morning to avoid insomnia.

## 2018-11-23 NOTE — Telephone Encounter (Signed)
Called patient and advised her to cut back on Wellbutrin to one a day per provider. Patient agreed with the plan and will call me back to let me know how she is doing.

## 2018-11-24 ENCOUNTER — Other Ambulatory Visit (HOSPITAL_COMMUNITY): Payer: Self-pay | Admitting: Psychiatry

## 2019-01-06 ENCOUNTER — Other Ambulatory Visit (HOSPITAL_COMMUNITY): Payer: Self-pay | Admitting: Psychiatry

## 2019-01-06 ENCOUNTER — Telehealth (HOSPITAL_COMMUNITY): Payer: Self-pay | Admitting: *Deleted

## 2019-01-06 MED ORDER — BUPROPION HCL ER (XL) 300 MG PO TB24: 300.0000 mg | ORAL_TABLET | ORAL | 2 refills | Status: DC

## 2019-01-06 NOTE — Telephone Encounter (Signed)
Pt called regarding increasing anxiety and decreased sleep since changing Wellbutrin dose. Please review and advise.    MG chewable tablet 81 mg, Daily         buPROPion (WELLBUTRIN SR) 150 MG 12 hr tablet 150 mg, 2 times daily 0 ordered       cyproheptadine (PERIACTIN) 4 MG tablet  0 ordered       DULoxetine (CYMBALTA) 30 MG capsule 90 mg, Daily 0 ordered       EVENING PRIMROSE OIL PO 1,300 mg, Daily        traZODone (DESYREL) 100 MG ta

## 2019-01-06 NOTE — Telephone Encounter (Signed)
Will call her and let her know.

## 2019-01-06 NOTE — Telephone Encounter (Signed)
I am confused - Wellbutrin was not changed since March this year and she has not been reporting any increase of anxiety or insomnia over these several months? On a remote chance that it is contributing to these problems I have changed its form from SR bid to XL once in the morning - this will definitely not affect sleep and is similarly not likely to cause anxiety.

## 2019-01-18 ENCOUNTER — Other Ambulatory Visit: Payer: Self-pay

## 2019-01-18 ENCOUNTER — Ambulatory Visit (INDEPENDENT_AMBULATORY_CARE_PROVIDER_SITE_OTHER): Payer: BC Managed Care – PPO | Admitting: Psychiatry

## 2019-01-18 DIAGNOSIS — F3281 Premenstrual dysphoric disorder: Secondary | ICD-10-CM | POA: Diagnosis not present

## 2019-01-18 DIAGNOSIS — F331 Major depressive disorder, recurrent, moderate: Secondary | ICD-10-CM

## 2019-01-18 DIAGNOSIS — F411 Generalized anxiety disorder: Secondary | ICD-10-CM

## 2019-01-18 MED ORDER — ZOLPIDEM TARTRATE ER 12.5 MG PO TBCR: 12.5000 mg | EXTENDED_RELEASE_TABLET | Freq: Every evening | ORAL | 1 refills | Status: DC | PRN

## 2019-01-18 MED ORDER — DULOXETINE HCL 60 MG PO CPEP: 120.0000 mg | ORAL_CAPSULE | Freq: Every day | ORAL | 0 refills | Status: DC
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
## 2019-01-22 ENCOUNTER — Other Ambulatory Visit (HOSPITAL_COMMUNITY): Payer: Self-pay | Admitting: Psychiatry

## 2019-01-23 ENCOUNTER — Other Ambulatory Visit (HOSPITAL_COMMUNITY): Payer: Self-pay | Admitting: Psychiatry

## 2019-01-28 ENCOUNTER — Other Ambulatory Visit (HOSPITAL_COMMUNITY): Payer: Self-pay | Admitting: Psychiatry

## 2019-03-22 ENCOUNTER — Other Ambulatory Visit: Payer: Self-pay

## 2019-03-22 ENCOUNTER — Ambulatory Visit (INDEPENDENT_AMBULATORY_CARE_PROVIDER_SITE_OTHER): Payer: 59 | Admitting: Psychiatry

## 2019-03-22 DIAGNOSIS — F3341 Major depressive disorder, recurrent, in partial remission: Secondary | ICD-10-CM

## 2019-03-22 DIAGNOSIS — F411 Generalized anxiety disorder: Secondary | ICD-10-CM

## 2019-03-22 DIAGNOSIS — F3281 Premenstrual dysphoric disorder: Secondary | ICD-10-CM

## 2019-03-22 MED ORDER — CYPROHEPTADINE HCL 4 MG PO TABS: ORAL_TABLET | ORAL | 1 refills | Status: AC

## 2019-03-22 MED ORDER — ZOLPIDEM TARTRATE ER 12.5 MG PO TBCR: 12.5000 mg | EXTENDED_RELEASE_TABLET | Freq: Every evening | ORAL | 5 refills | Status: DC | PRN

## 2019-03-22 MED ORDER — DULOXETINE HCL 60 MG PO CPEP: 120.0000 mg | ORAL_CAPSULE | Freq: Every day | ORAL | 1 refills | Status: DC

## 2019-03-22 MED ORDER — BUPROPION HCL ER (XL) 300 MG PO TB24: 300.0000 mg | ORAL_TABLET | ORAL | 1 refills | Status: DC
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
## 2019-04-07 ENCOUNTER — Telehealth (HOSPITAL_COMMUNITY): Payer: Self-pay | Admitting: *Deleted

## 2019-04-07 NOTE — Telephone Encounter (Signed)
Pt called clinic c/o se she thinks are r/t "recent" med changes. .Ambien Cr and Pericatin were added looks like on last visit 03/22/19. Pt c/o avh, insomnia, and panic attacks. Pt next appointment on 06/18/19. Please review and advise.

## 2019-04-08 NOTE — Telephone Encounter (Signed)
There were no recent medication changes made - she has been on Ambien for over three months and has not reported these symptoms on visit last month (on the contrary said she has been sleeping well and not needing Ambien often). I have no idea what is going on so she needs to schedule an appointment for next week rather than wait till May.

## 2019-05-05 ENCOUNTER — Other Ambulatory Visit (HOSPITAL_COMMUNITY): Payer: Self-pay | Admitting: Psychiatry

## 2019-05-11 ENCOUNTER — Ambulatory Visit (INDEPENDENT_AMBULATORY_CARE_PROVIDER_SITE_OTHER): Payer: 59 | Admitting: Psychiatry

## 2019-05-11 ENCOUNTER — Other Ambulatory Visit: Payer: Self-pay

## 2019-05-11 ENCOUNTER — Other Ambulatory Visit (HOSPITAL_COMMUNITY): Payer: Self-pay | Admitting: Psychiatry

## 2019-05-11 DIAGNOSIS — F411 Generalized anxiety disorder: Secondary | ICD-10-CM | POA: Diagnosis not present

## 2019-05-11 DIAGNOSIS — F3281 Premenstrual dysphoric disorder: Secondary | ICD-10-CM

## 2019-05-11 DIAGNOSIS — F3341 Major depressive disorder, recurrent, in partial remission: Secondary | ICD-10-CM

## 2019-05-11 DIAGNOSIS — F410 Panic disorder [episodic paroxysmal anxiety] without agoraphobia: Secondary | ICD-10-CM | POA: Insufficient documentation

## 2019-05-11 MED ORDER — DIAZEPAM 5 MG PO TABS: 5.0000 mg | ORAL_TABLET | Freq: Two times a day (BID) | ORAL | 0 refills | Status: DC | PRN

## 2019-05-11 MED ORDER — DIAZEPAM 5 MG PO TABS: 5.0000 mg | ORAL_TABLET | Freq: Two times a day (BID) | ORAL | 1 refills | Status: DC | PRN
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
## 2019-05-11 NOTE — Progress Notes (Unsigned)
Done

## 2019-06-18 ENCOUNTER — Other Ambulatory Visit: Payer: Self-pay

## 2019-06-18 ENCOUNTER — Telehealth (HOSPITAL_COMMUNITY): Payer: 59 | Admitting: Psychiatry

## 2019-06-18 ENCOUNTER — Other Ambulatory Visit (HOSPITAL_COMMUNITY): Payer: Self-pay | Admitting: Psychiatry

## 2019-06-18 MED ORDER — DIAZEPAM 5 MG PO TABS: 5.0000 mg | ORAL_TABLET | Freq: Two times a day (BID) | ORAL | 0 refills | Status: AC | PRN

## 2019-07-12 ENCOUNTER — Telehealth (HOSPITAL_COMMUNITY): Payer: 59 | Admitting: Psychiatry

## 2019-07-15 ENCOUNTER — Other Ambulatory Visit: Payer: Self-pay

## 2019-07-15 ENCOUNTER — Telehealth (INDEPENDENT_AMBULATORY_CARE_PROVIDER_SITE_OTHER): Payer: Self-pay | Admitting: Psychiatry

## 2019-07-15 DIAGNOSIS — F3341 Major depressive disorder, recurrent, in partial remission: Secondary | ICD-10-CM

## 2019-07-15 DIAGNOSIS — F411 Generalized anxiety disorder: Secondary | ICD-10-CM

## 2019-07-15 DIAGNOSIS — F410 Panic disorder [episodic paroxysmal anxiety] without agoraphobia: Secondary | ICD-10-CM

## 2019-07-15 DIAGNOSIS — F3281 Premenstrual dysphoric disorder: Secondary | ICD-10-CM

## 2019-07-15 MED ORDER — BUPROPION HCL ER (XL) 300 MG PO TB24: 300.0000 mg | ORAL_TABLET | ORAL | 1 refills | Status: DC

## 2019-07-15 MED ORDER — DULOXETINE HCL 60 MG PO CPEP: 120.0000 mg | ORAL_CAPSULE | Freq: Every day | ORAL | 1 refills | Status: DC
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
## 2019-07-22 IMAGING — US US PELVIS COMPLETE
1 series · 13 of 25 positions shown · non-contrast
Comparison: None available currently.

CLINICAL DATA: Acute left lower quadrant abdominal pain.

EXAM:
TRANSABDOMINAL AND TRANSVAGINAL ULTRASOUND OF PELVIS
DOPPLER ULTRASOUND OF OVARIES
TECHNIQUE: Both transabdominal and transvaginal ultrasound examinations of the
pelvis were performed. Transabdominal technique was performed for
global imaging of the pelvis including uterus, ovaries, adnexal
regions, and pelvic cul-de-sac.
It was necessary to proceed with endovaginal exam following the
transabdominal exam to visualize the ovaries. Color and duplex
Doppler ultrasound was utilized to evaluate blood flow to the
ovaries.

[Series 1: us pelvis complete · 0.29mm/px · 80 acquisitions, 13 frames shown]
[im 1/80]
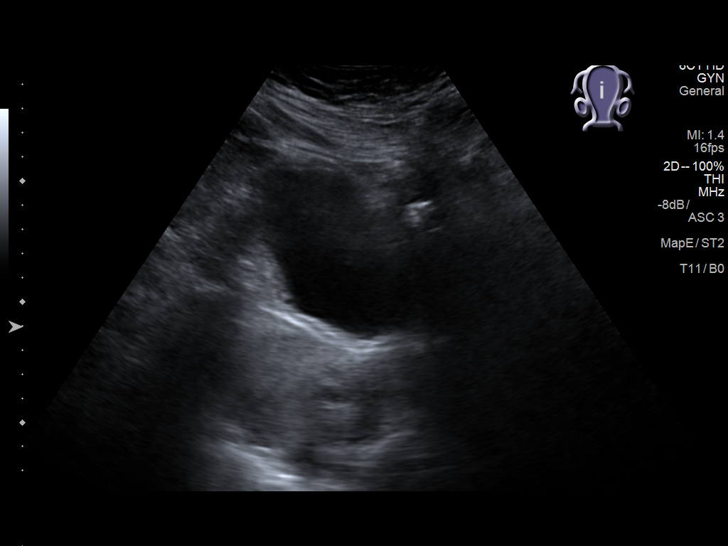
[im 7/80]
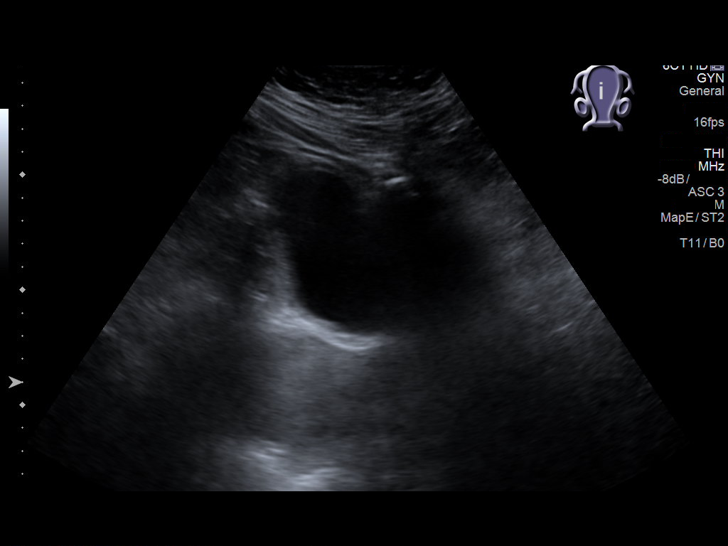
[im 14/80]
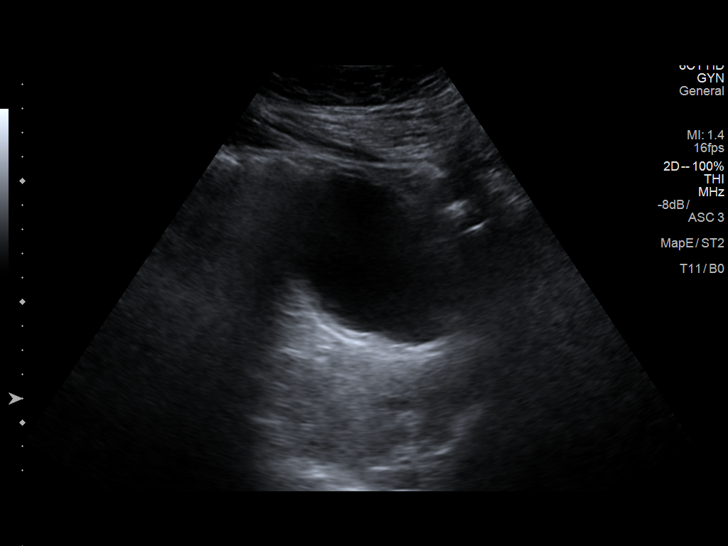
[im 20/80]
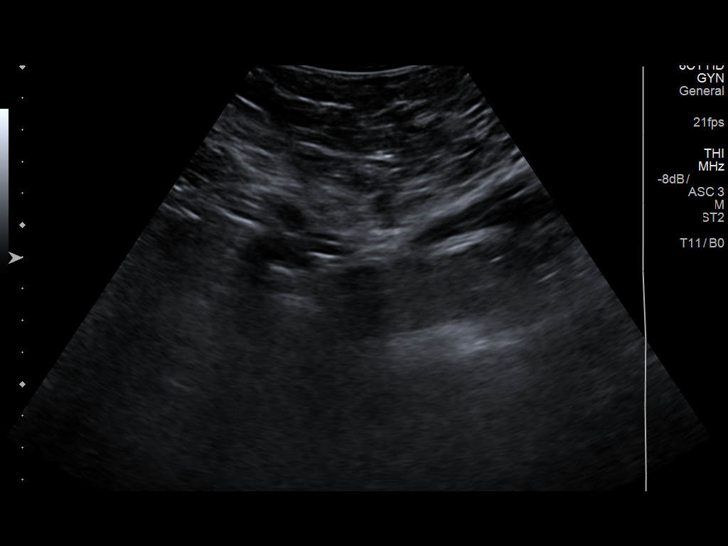
[im 27/80]
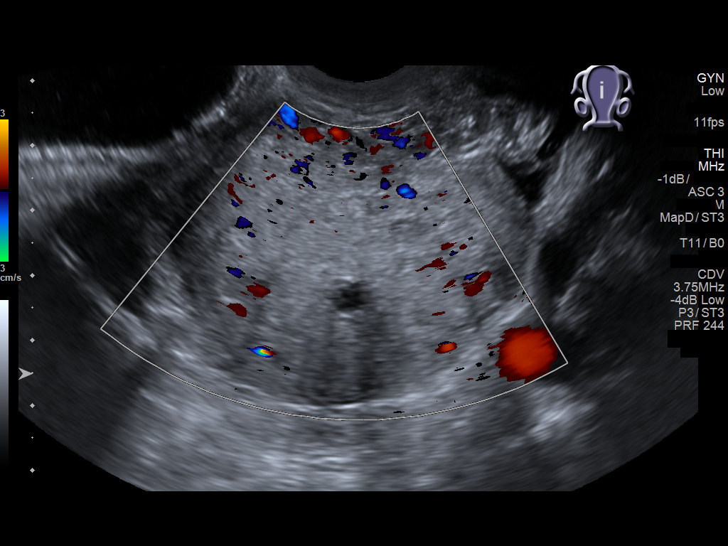
[im 33/80]
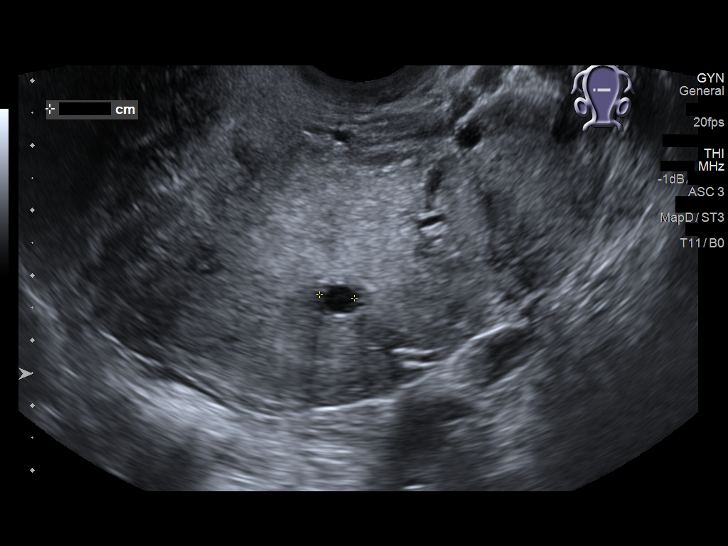
[im 40/80]
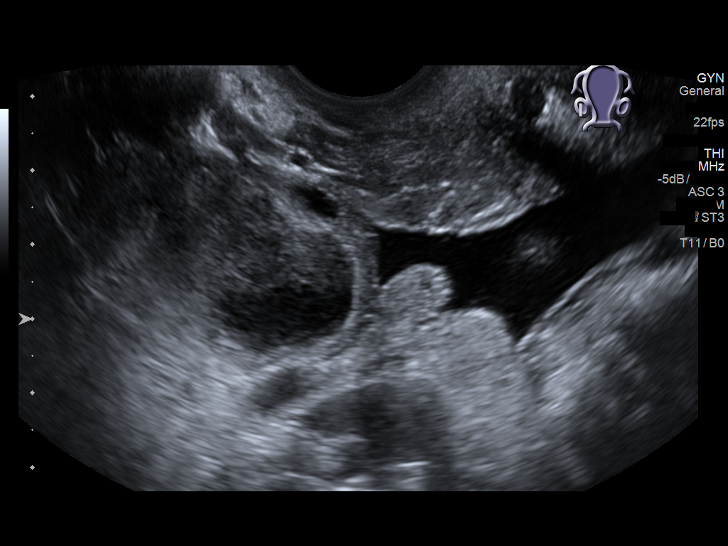
[im 47/80]
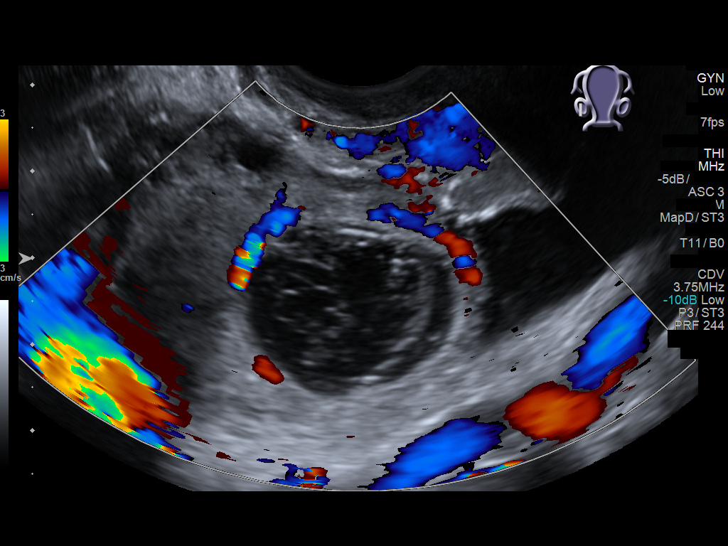
[im 53/80]
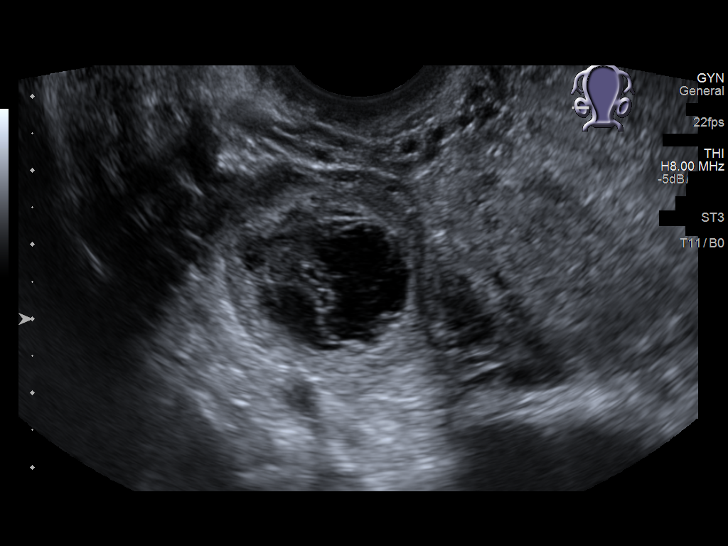
[im 60/80]
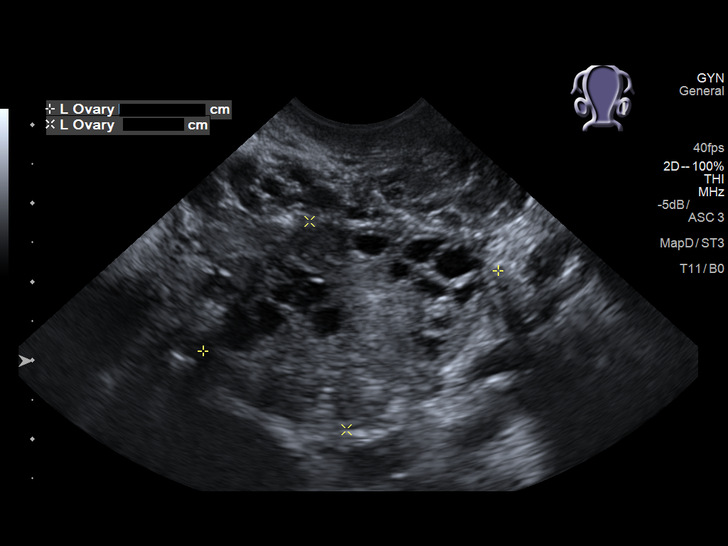
[im 66/80]
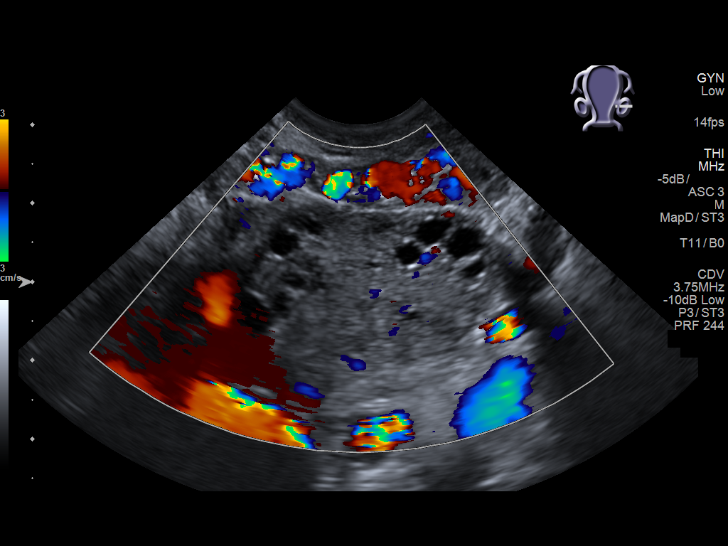
[im 73/80]
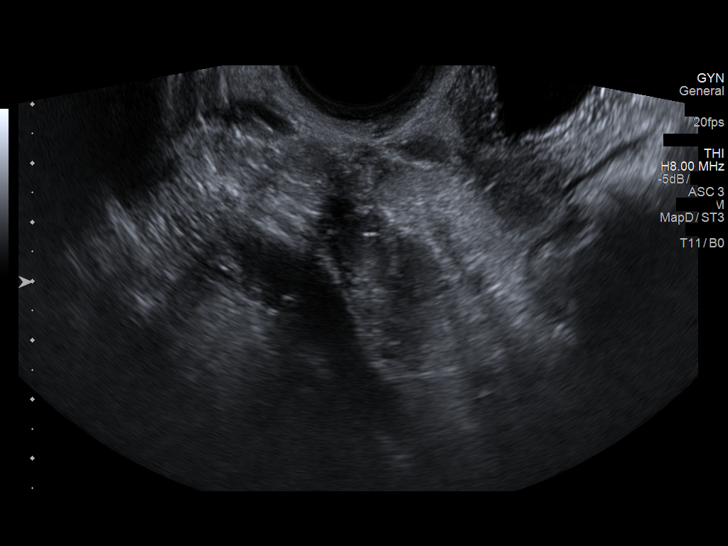
[im 80/80]
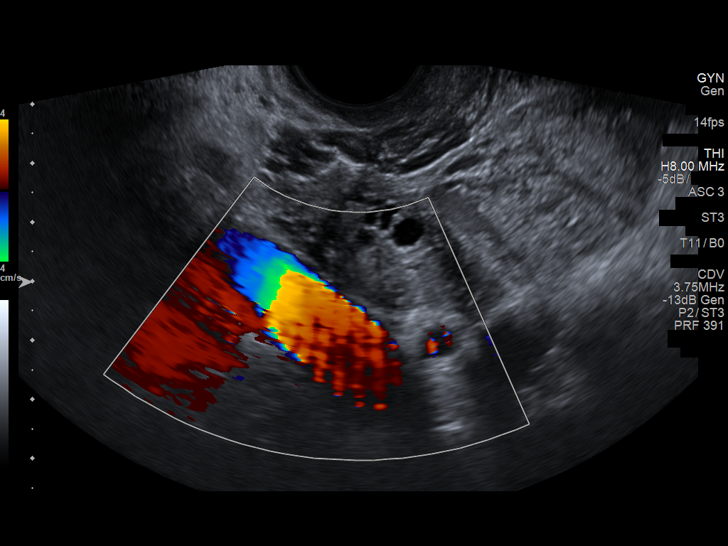

[13 of 25 positions shown; findings below may reference images not displayed]

FINDINGS: Uterus

Measurements: 8.1 x 6.3 x 5.1 cm. 7 mm cystic area is noted
posteriorly and to the right in the uterus which may represent
cystic fibroid.

Endometrium

Thickness: 9 mm which is within normal limits. 1.1 cm cystic area is
noted which may represent former ablation site.

Right ovary

Measurements: 4.3 x 2.9 x 2.9 cm. 3 cm complex cyst is noted
concerning for hemorrhagic cyst or endometrioma.

Left ovary

Measurements: 3.9 x 2.7 x 2.2 cm. Normal appearance/no adnexal mass.

Pulsed Doppler evaluation of both ovaries demonstrates normal
low-resistance arterial and venous waveforms.

Other findings

Small amount of free fluid is noted which most likely is
physiologic.
IMPRESSION: 3 cm complex cyst seen in right ovary most consistent with
hemorrhagic cyst or endometrioma. Short-interval follow up
ultrasound in 6-12 weeks is recommended, preferably during the week
following the patient's normal menses.

There is no Doppler evidence of ovarian torsion.

1.1 cm cystic area seen in endometrium most consistent with prior
ablation.

## 2019-10-15 ENCOUNTER — Other Ambulatory Visit: Payer: Self-pay

## 2019-10-15 ENCOUNTER — Telehealth (INDEPENDENT_AMBULATORY_CARE_PROVIDER_SITE_OTHER): Payer: Medicaid Other | Admitting: Psychiatry

## 2019-10-15 DIAGNOSIS — F411 Generalized anxiety disorder: Secondary | ICD-10-CM

## 2019-10-15 DIAGNOSIS — F410 Panic disorder [episodic paroxysmal anxiety] without agoraphobia: Secondary | ICD-10-CM

## 2019-10-15 DIAGNOSIS — F3281 Premenstrual dysphoric disorder: Secondary | ICD-10-CM

## 2019-10-15 DIAGNOSIS — F3342 Major depressive disorder, recurrent, in full remission: Secondary | ICD-10-CM | POA: Diagnosis not present
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
## 2019-12-18 ENCOUNTER — Other Ambulatory Visit (HOSPITAL_COMMUNITY): Payer: Self-pay | Admitting: Psychiatry

## 2020-01-18 ENCOUNTER — Other Ambulatory Visit: Payer: Self-pay

## 2020-01-18 ENCOUNTER — Telehealth (INDEPENDENT_AMBULATORY_CARE_PROVIDER_SITE_OTHER): Payer: Medicaid Other | Admitting: Psychiatry

## 2020-01-18 DIAGNOSIS — F3341 Major depressive disorder, recurrent, in partial remission: Secondary | ICD-10-CM

## 2020-01-18 DIAGNOSIS — F410 Panic disorder [episodic paroxysmal anxiety] without agoraphobia: Secondary | ICD-10-CM

## 2020-01-18 DIAGNOSIS — F411 Generalized anxiety disorder: Secondary | ICD-10-CM | POA: Diagnosis not present

## 2020-01-18 MED ORDER — ZOLPIDEM TARTRATE ER 12.5 MG PO TBCR: 12.5000 mg | EXTENDED_RELEASE_TABLET | Freq: Every evening | ORAL | 5 refills | Status: AC | PRN

## 2020-01-18 MED ORDER — DIAZEPAM 5 MG PO TABS: 5.0000 mg | ORAL_TABLET | Freq: Every day | ORAL | 3 refills | Status: AC | PRN
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
## 2020-03-06 ENCOUNTER — Telehealth (INDEPENDENT_AMBULATORY_CARE_PROVIDER_SITE_OTHER): Payer: Medicaid Other | Admitting: Psychiatry

## 2020-03-06 ENCOUNTER — Other Ambulatory Visit: Payer: Self-pay

## 2020-03-06 DIAGNOSIS — F3181 Bipolar II disorder: Secondary | ICD-10-CM | POA: Diagnosis not present

## 2020-03-06 DIAGNOSIS — F411 Generalized anxiety disorder: Secondary | ICD-10-CM

## 2020-03-06 DIAGNOSIS — F410 Panic disorder [episodic paroxysmal anxiety] without agoraphobia: Secondary | ICD-10-CM

## 2020-03-06 MED ORDER — ARIPIPRAZOLE 2 MG PO TABS: 2.0000 mg | ORAL_TABLET | Freq: Every day | ORAL | 1 refills | Status: DC
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
## 2020-03-28 ENCOUNTER — Other Ambulatory Visit (HOSPITAL_COMMUNITY): Payer: Self-pay | Admitting: Psychiatry

## 2020-04-18 ENCOUNTER — Telehealth (INDEPENDENT_AMBULATORY_CARE_PROVIDER_SITE_OTHER): Payer: Medicaid Other | Admitting: Psychiatry

## 2020-04-18 ENCOUNTER — Other Ambulatory Visit: Payer: Self-pay

## 2020-04-18 DIAGNOSIS — F3281 Premenstrual dysphoric disorder: Secondary | ICD-10-CM | POA: Diagnosis not present

## 2020-04-18 DIAGNOSIS — F3181 Bipolar II disorder: Secondary | ICD-10-CM | POA: Diagnosis not present

## 2020-04-18 DIAGNOSIS — F410 Panic disorder [episodic paroxysmal anxiety] without agoraphobia: Secondary | ICD-10-CM | POA: Diagnosis not present

## 2020-04-18 DIAGNOSIS — F411 Generalized anxiety disorder: Secondary | ICD-10-CM

## 2020-04-18 MED ORDER — BUPROPION HCL ER (XL) 300 MG PO TB24: ORAL_TABLET | ORAL | 1 refills | Status: AC

## 2020-04-18 MED ORDER — DULOXETINE HCL 60 MG PO CPEP: 120.0000 mg | ORAL_CAPSULE | Freq: Every day | ORAL | 1 refills | Status: AC
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
## 2020-07-04 ENCOUNTER — Telehealth (HOSPITAL_COMMUNITY): Payer: Self-pay | Admitting: *Deleted

## 2020-07-04 NOTE — Telephone Encounter (Signed)
Telephone call to patient to advise that this office will no longer be able to provide services.  Patient has enough medication.  No refill needed..  Patient is aware we will no longer provide medication or a psychiatrist appointment.

## 2020-07-10 ENCOUNTER — Telehealth (HOSPITAL_COMMUNITY): Payer: Medicaid Other | Admitting: Psychiatry

## 2020-12-27 ENCOUNTER — Emergency Department (HOSPITAL_BASED_OUTPATIENT_CLINIC_OR_DEPARTMENT_OTHER): Payer: 59

## 2020-12-27 ENCOUNTER — Other Ambulatory Visit: Payer: Self-pay

## 2020-12-27 ENCOUNTER — Encounter (HOSPITAL_BASED_OUTPATIENT_CLINIC_OR_DEPARTMENT_OTHER): Payer: Self-pay | Admitting: Emergency Medicine

## 2020-12-27 ENCOUNTER — Emergency Department (HOSPITAL_BASED_OUTPATIENT_CLINIC_OR_DEPARTMENT_OTHER)
Admission: EM | Admit: 2020-12-27 | Discharge: 2020-12-27 | Disposition: A | Discharge status: Home / Self Care | Payer: 59 | Attending: Emergency Medicine | Admitting: Emergency Medicine

## 2020-12-27 DIAGNOSIS — R42 Dizziness and giddiness: Secondary | ICD-10-CM | POA: Diagnosis not present

## 2020-12-27 DIAGNOSIS — Z79899 Other long term (current) drug therapy: Secondary | ICD-10-CM | POA: Diagnosis not present

## 2020-12-27 DIAGNOSIS — E119 Type 2 diabetes mellitus without complications: Secondary | ICD-10-CM | POA: Diagnosis not present

## 2020-12-27 DIAGNOSIS — I10 Essential (primary) hypertension: Secondary | ICD-10-CM | POA: Diagnosis not present

## 2020-12-27 DIAGNOSIS — R519 Headache, unspecified: Secondary | ICD-10-CM | POA: Diagnosis not present

## 2020-12-27 HISTORY — DX: Type 2 diabetes mellitus without complications: E11.9

## 2020-12-27 LAB — CBC WITH DIFFERENTIAL/PLATELET
Abs Immature Granulocytes: 0.02 10*3/uL (ref 0.00–0.07)
Basophils Absolute: 0 10*3/uL (ref 0.0–0.1)
Basophils Relative: 1 %
Eosinophils Absolute: 0.1 10*3/uL (ref 0.0–0.5)
Eosinophils Relative: 1 %
HCT: 41.9 % (ref 36.0–46.0)
Hemoglobin: 13.6 g/dL (ref 12.0–15.0)
Immature Granulocytes: 0 %
Lymphocytes Relative: 52 %
Lymphs Abs: 2.8 10*3/uL (ref 0.7–4.0)
MCH: 29.3 pg (ref 26.0–34.0)
MCHC: 32.5 g/dL (ref 30.0–36.0)
MCV: 90.3 fL (ref 80.0–100.0)
Monocytes Absolute: 0.2 10*3/uL (ref 0.1–1.0)
Monocytes Relative: 4 %
Neutro Abs: 2.3 10*3/uL (ref 1.7–7.7)
Neutrophils Relative %: 42 %
Platelets: 262 10*3/uL (ref 150–400)
RBC: 4.64 MIL/uL (ref 3.87–5.11)
RDW: 13.2 % (ref 11.5–15.5)
WBC: 5.4 10*3/uL (ref 4.0–10.5)
nRBC: 0 % (ref 0.0–0.2)

## 2020-12-27 LAB — COMPREHENSIVE METABOLIC PANEL
ALT: 33 U/L (ref 0–44)
AST: 29 U/L (ref 15–41)
Albumin: 4.1 g/dL (ref 3.5–5.0)
Alkaline Phosphatase: 70 U/L (ref 38–126)
Anion gap: 8 (ref 5–15)
BUN: 13 mg/dL (ref 6–20)
CO2: 23 mmol/L (ref 22–32)
Calcium: 9.6 mg/dL (ref 8.9–10.3)
Chloride: 101 mmol/L (ref 98–111)
Creatinine, Ser: 0.8 mg/dL (ref 0.44–1.00)
GFR, Estimated: >60 mL/min (ref >60)
Glucose, Bld: 151 mg/dL — ABNORMAL HIGH (ref 70–99)
Potassium: 3.4 mmol/L — ABNORMAL LOW (ref 3.5–5.1)
Sodium: 132 mmol/L — ABNORMAL LOW (ref 135–145)
Total Bilirubin: 0.5 mg/dL (ref 0.3–1.2)
Total Protein: 7.5 g/dL (ref 6.5–8.1)

## 2020-12-27 MED ORDER — KETOROLAC TROMETHAMINE 30 MG/ML IJ SOLN
30.0000 mg | Freq: Once | INTRAMUSCULAR | Status: AC
  Administered 2020-12-27: 30 mg via INTRAMUSCULAR
  Filled 2020-12-27: qty 1

## 2020-12-27 NOTE — Discharge Instructions (Signed)
Please read and follow all provided instructions.  Your diagnoses today include:  1. Acute nonintractable headache, unspecified headache type   2. Hypertension, unspecified type     Tests performed today include: CT of your head which was normal and did not show any serious cause of your headache Complete blood cell count:  Basic metabolic panel: Kidney function is normal Vital signs. See below for your results today.   Medications:  In the Emergency Department you received: Toradol - NSAID medication similar to ibuprofen  Take any prescribed medications only as directed.  Additional information:  Follow any educational materials contained in this packet.  If your blood pressure is higher than 140/90 in the mornings, you may consider taking 10 mg of amlodipine to help better control your pressure.  Please follow-up with your primary care doctor for additional advice on blood pressure control and final decision for dosage.  You are having a headache. No specific cause was found today for your headache. It may have been a migraine or other cause of headache. Stress, anxiety, fatigue, and depression are common triggers for headaches.   Your headache today does not appear to be life-threatening or require hospitalization, but often the exact cause of headaches is not determined in the emergency department. Therefore, follow-up with your doctor is very important to find out what may have caused your headache and whether or not you need any further diagnostic testing or treatment.   Sometimes headaches can appear benign (not harmful), but then more serious symptoms can develop which should prompt an immediate re-evaluation by your doctor or the emergency department.  BE VERY CAREFUL not to take multiple medicines containing Tylenol (also called acetaminophen). Doing so can lead to an overdose which can damage your liver and cause liver failure and possibly death.   Follow-up  instructions: Please follow-up with your primary care provider in the next 3 days for further evaluation of your symptoms.   Return instructions:  Please return to the Emergency Department if you experience worsening symptoms. Return if the medications do not resolve your headache, if it recurs, or if you have multiple episodes of vomiting or cannot keep down fluids. Return if you have a change from the usual headache. RETURN IMMEDIATELY IF you: Develop a sudden, severe headache Develop confusion or become poorly responsive or faint Develop a fever above 100.51F or problem breathing Have a change in speech, vision, swallowing, or understanding Develop new weakness, numbness, tingling, incoordination in your arms or legs Have a seizure Please return if you have any other emergent concerns.  Additional Information:  Your vital signs today were: BP 140/90 (BP Location: Right Arm)   Pulse (!) 110   Temp 98.2 F (36.8 C)   Resp 18   Ht 5\' 2"  (1.575 m)   Wt (!) 142 kg   SpO2 100%   BMI 57.26 kg/m  If your blood pressure (BP) was elevated above 135/85 this visit, please have this repeated by your doctor within one month. --------------

## 2020-12-27 NOTE — ED Notes (Signed)
Patient transported to CT 

## 2020-12-27 NOTE — ED Notes (Signed)
Patient returned from CT

## 2020-12-27 NOTE — ED Notes (Signed)
RN provided AVS using Teachback Method. Patient verbalizes understanding of Discharge Instructions. Opportunity for Questioning and Answers were provided by RN. Patient Discharged from ED ambulatory to Home via Self.  

## 2020-12-27 NOTE — ED Triage Notes (Signed)
Started on new bp med  on Friday and has had a h/a x 1 week and she feel dizzy ,  took a goodie powder for h/a

## 2020-12-27 NOTE — ED Provider Notes (Signed)
[facility] EMERGENCY DEPT Provider Note   CSN: [id number] Arrival date & time: 12/27/20  1602     History Chief Complaint  Patient presents with   Hypertension    Heather Vargas is a 40 y.o. female.  Patient with history of diabetes, migraine headaches, hypertension --presents to the emergency department for evaluation of headaches, dizziness, blurry vision.  Symptoms have been going on for about 2 weeks.  Patient went to her primary care doctor for refill of medications and she was found to have high blood pressure.  She was started on 5 mg of amlodipine.  She has been taking this for about 5 days.  She continues to feel poorly.  Symptoms were not better and blood pressures were still running high at home, sometimes up to [id number] systolic.  She decided to come in for evaluation.  She admits to feeling anxious.  No neck pain or neck stiffness.  No vision loss.  Headache is frontal and different than her typical migraines.  No history of kidney problems.  No chest pain or shortness of breath.  The onset of this condition was acute. The course is constant. Aggravating factors: none. Alleviating factors: none.         Past Medical History:  Diagnosis Date   Anemia    during 1st pregnancy;iron supp taken   Anxiety    Bacterial infection    Depression    Diabetes mellitus without complication ([place])    H/O migraine    H/O varicella    Headache(784.0)    Migraines;was on Topamax last year;no longer taking   History of depression 09/10/2011   Prozac in the past   History of PCOS 12/11/2010   History of polyhydramnios 09/10/2011   IOL for poly w/ G1 in 2002   Hypertension    on Labetolol 100mg  bid   Increased BMI 12/11/2010   Irregular menstrual cycle 2012   Morbidly obese ([place]) 09/10/2011   NSVD (normal spontaneous vaginal delivery) 03/24/2012   Ovarian cyst    PMS (premenstrual syndrome) 04/09/2011   was Rx'd Prozac   Pregnancy 09/25/2011   Pulmonary embolism  ([place]) 2013   on Lovenox 130mg  bid   Trichomonas    as a teen   Vitamin D deficiency 12/11/2010   Yeast infection     Patient Active Problem List   Diagnosis Date Noted   Panic disorder 05/11/2019   Adjustment disorder with mixed anxiety and depressed mood 09/16/2018   Bipolar 2 disorder ([place]) 07/20/2018   PMDD (premenstrual dysphoric disorder) 07/20/2018   GAD (generalized anxiety disorder) 03/13/2018   Personal history of venous thrombosis and embolism 04/15/2012   NSVD (normal spontaneous vaginal delivery) 03/24/2012   Nuchal cord, delivered, current hospitalization 03/24/2012   Anemia 01/02/2012   Desires tubal ligation 12/03/2011   Pregnancy 09/25/2011   Morbidly obese ([place]) 09/10/2011   History of polyhydramnios 09/10/2011   PE (pulmonary embolism) 08/02/2011   HTN (hypertension) 08/02/2011   Chest pain on breathing 07/31/2011   SOB (shortness of breath) 07/31/2011   PCOS (polycystic ovarian syndrome) 07/31/2011    Past Surgical History:  Procedure Laterality Date   DILITATION & CURRETTAGE/HYSTROSCOPY WITH THERMACHOICE ABLATION N/A 01/06/2014   Procedure: DILATATION & CURETTAGE/HYSTEROSCOPY WITH THERMACHOICE ABLATION;  Surgeon: Crawford Givens, MD;  Location: [facility];  Service: Gynecology;  Laterality: N/A;   HYSTEROSCOPY N/A 05/28/2012   Procedure: HYSTEROSCOPY;  Surgeon: Betsy Coder, MD;  Location: [facility];  Service: Gynecology;  Laterality: N/A;   TOOTH  EXTRACTION     TUBAL LIGATION Bilateral 05/28/2012   Procedure: ESSURE TUBAL STERILIZATION;  Surgeon: Michael LitterNaima A Dillard, MD;  Location: [facility];  Service: Gynecology;  Laterality: Bilateral;     OB History     Gravida  3   Para  3   Term  3   Preterm      AB      Living  3      SAB      IAB      Ectopic      Multiple      Live Births  3           Family History  Problem Relation Age of Onset   Hypertension Mother    Hypertension Father    Diabetes Maternal Grandmother    Stroke Maternal  Grandmother    Thyroid disease Maternal Grandmother    Cancer Paternal Grandmother        Colon   Thyroid disease Paternal Grandmother    Schizophrenia Paternal Grandmother    Alcohol abuse Paternal Grandmother    Depression Paternal Grandmother    Cancer Paternal Uncle        Prostate x 3   Seizures Son        Epilelpsy    Social History   Tobacco Use   Smoking status: Never   Smokeless tobacco: Never  Vaping Use   Vaping Use: Never used  Substance Use Topics   Alcohol use: Yes    Comment: socially   Drug use: No    Home Medications Prior to Admission medications   Medication Sig Start Date End Date Taking? Authorizing Provider  ARIPiprazole (ABILIFY) 2 MG tablet TAKE 1 TABLET BY MOUTH EVERY DAY 03/28/20   Pucilowski, Roosvelt Maserlgierd A, MD  aspirin 81 MG chewable tablet Chew 81 mg by mouth daily.    [provider]  buPROPion (WELLBUTRIN XL) 300 MG 24 hr tablet TAKE 1 TABLET BY MOUTH EVERY DAY IN THE MORNING 04/18/20   Pucilowski, Olgierd A, MD  cyproheptadine (PERIACTIN) 4 MG tablet TAKE 1 TABLET 1-2 HOURS BEFORE SEXUAL ACTIVITY DAILY AS NEEDED 03/22/19   Pucilowski, Olgierd A, MD  DULoxetine (CYMBALTA) 60 MG capsule Take 2 capsules (120 mg total) by mouth daily. 04/18/20   Pucilowski, Roosvelt Maserlgierd A, MD  EVENING PRIMROSE OIL PO Take 1,300 mg by mouth daily.    [provider]  zolpidem (AMBIEN CR) 12.5 MG CR tablet Take 1 tablet (12.5 mg total) by mouth at bedtime as needed for sleep. 01/18/20 07/16/20  Pucilowski, Roosvelt Maserlgierd A, MD    Allergies    Patient has no known allergies.  Review of Systems   Review of Systems  Constitutional:  Negative for fever.  HENT:  Negative for congestion, dental problem, rhinorrhea and sinus pressure.   Eyes:  Positive for visual disturbance. Negative for photophobia, discharge and redness.  Respiratory:  Negative for shortness of breath.   Cardiovascular:  Negative for chest pain.  Gastrointestinal:  Negative for nausea and vomiting.   Musculoskeletal:  Negative for gait problem, neck pain and neck stiffness.  Skin:  Negative for rash.  Neurological:  Positive for dizziness and headaches. Negative for syncope, speech difficulty, weakness, light-headedness and numbness.  Psychiatric/Behavioral:  Negative for confusion.    Physical Exam Updated Vital Signs BP (!) 144/99 (BP Location: Right Arm)   Pulse (!) 117   Temp 98.2 F (36.8 C)   Resp 18   Ht 5\' 2"  (1.575 m)  Wt (!) 142 kg   SpO2 97%   BMI 57.26 kg/m   Physical Exam Vitals and nursing note reviewed.  Constitutional:      Appearance: She is well-developed.  HENT:     Head: Normocephalic and atraumatic.     Right Ear: Tympanic membrane, ear canal and external ear normal.     Left Ear: Tympanic membrane, ear canal and external ear normal.     Nose: Nose normal.     Mouth/Throat:     Pharynx: Uvula midline.  Eyes:     General: Lids are normal.     Extraocular Movements:     Right eye: No nystagmus.     Left eye: No nystagmus.     Conjunctiva/sclera: Conjunctivae normal.     Pupils: Pupils are equal, round, and reactive to light.  Cardiovascular:     Rate and Rhythm: Normal rate and regular rhythm.  Pulmonary:     Effort: Pulmonary effort is normal.     Breath sounds: Normal breath sounds.  Abdominal:     Palpations: Abdomen is soft.     Tenderness: There is no abdominal tenderness.  Musculoskeletal:     Cervical back: Normal range of motion and neck supple. No tenderness or bony tenderness.  Skin:    General: Skin is warm and dry.  Neurological:     Mental Status: She is alert and oriented to person, place, and time.     GCS: GCS eye subscore is 4. GCS verbal subscore is 5. GCS motor subscore is 6.     Cranial Nerves: No cranial nerve deficit.     Sensory: No sensory deficit.     Motor: No weakness.     Coordination: Coordination normal.     Gait: Gait normal.     Comments: Upper extremity myotomes tested bilaterally:  C5 Shoulder  abduction 5/5 C6 Elbow flexion/wrist extension 5/5 C7 Elbow extension 5/5 C8 Finger flexion 5/5 T1 Finger abduction 5/5  Lower extremity myotomes tested bilaterally: L2 Hip flexion 5/5 L3 Knee extension 5/5 L4 Ankle dorsiflexion 5/5 S1 Ankle plantar flexion 5/5     ED Results / Procedures / Treatments   Labs (all labs ordered are listed, but only abnormal results are displayed) Labs Reviewed  CBC WITH DIFFERENTIAL/PLATELET  COMPREHENSIVE METABOLIC PANEL  PREGNANCY, URINE  URINALYSIS, ROUTINE W REFLEX MICROSCOPIC    EKG None  Radiology No results found.  Procedures Procedures   Medications Ordered in ED Medications - No data to display  ED Course  I have reviewed the triage vital signs and the nursing notes.  Pertinent labs & imaging results that were available during my care of the patient were reviewed by me and considered in my medical decision making (see chart for details).  Patient seen and examined.   Labs ordered in triage.  Given new type headache and vague neuro's findings including dizziness and blurry vision in setting of hypertension, discussed head imaging with patient.  She agrees to proceed.  If negative, will give dose of Toradol for headache.  She drove today.  Vital signs reviewed and are as follows: BP (!) 144/99 (BP Location: Right Arm)   Pulse (!) 117   Temp 98.2 F (36.8 C)   Resp 18   Ht 5\' 2"  (1.575 m)   Wt (!) 142 kg   SpO2 97%   BMI 57.26 kg/m   7:04 PM patient rechecked.  She states that she is feeling better.  Continues to have a mild headache  after Toradol.  No distress.  We discussed results.  Plan for discharge to home.  I think it would be reasonable for her to take 10 mg of amlodipine in the morning instead of 5 mg if her blood pressure is higher than 140/90.  I encouraged her to follow-up with her primary care doctor for recheck of blood pressure and for final decision on dosage.  Patient counseled to return if they  have weakness in their arms or legs, slurred speech, trouble walking or talking, confusion, trouble with their balance, or if they have any other concerns. Patient verbalizes understanding and agrees with plan.     MDM Rules/Calculators/A&P                           Patient with headache, dizziness, blurry vision.  Symptoms different than her previous migraines.  This is in setting of recent diagnosis of hypertension.  Negative head CT.    Patient without high-risk features of headache including: sudden onset/thunderclap HA, no similar headache in past, altered mental status, accompanying seizure, headache with exertion, age > 48, history of immunocompromise, neck or shoulder pain, fever, use of anticoagulation, family history of spontaneous SAH, concomitant drug use, toxic exposure.   Patient has a normal complete neurological exam, normal vital signs, normal level of consciousness, no signs of meningismus, is well-appearing/non-toxic appearing, no signs of trauma.   No dangerous or life-threatening conditions suspected or identified by history, physical exam, and by work-up. No indications for hospitalization identified.   HTN: Continue amlodipine, consider 10 mg with elevated blood pressure in the morning.  Final Clinical Impression(s) / ED Diagnoses Final diagnoses:  Acute nonintractable headache, unspecified headache type  Hypertension, unspecified type    Rx / DC Orders ED Discharge Orders     None        Renne Crigler, Cordelia Poche 12/27/20 1906    Cheryll Cockayne, MD 12/27/20 2310

## 2021-10-31 ENCOUNTER — Other Ambulatory Visit (HOSPITAL_BASED_OUTPATIENT_CLINIC_OR_DEPARTMENT_OTHER): Payer: Self-pay

## 2021-10-31 ENCOUNTER — Encounter (HOSPITAL_BASED_OUTPATIENT_CLINIC_OR_DEPARTMENT_OTHER): Payer: Self-pay | Admitting: Emergency Medicine

## 2021-10-31 ENCOUNTER — Emergency Department (HOSPITAL_BASED_OUTPATIENT_CLINIC_OR_DEPARTMENT_OTHER): Payer: Medicaid Other | Admitting: Radiology

## 2021-10-31 ENCOUNTER — Emergency Department (HOSPITAL_BASED_OUTPATIENT_CLINIC_OR_DEPARTMENT_OTHER)
Admission: EM | Admit: 2021-10-31 | Discharge: 2021-10-31 | Disposition: A | Discharge status: Home / Self Care | Payer: Medicaid Other | Attending: Emergency Medicine | Admitting: Emergency Medicine

## 2021-10-31 ENCOUNTER — Other Ambulatory Visit: Payer: Self-pay

## 2021-10-31 DIAGNOSIS — Z20822 Contact with and (suspected) exposure to covid-19: Secondary | ICD-10-CM | POA: Diagnosis not present

## 2021-10-31 DIAGNOSIS — Z7982 Long term (current) use of aspirin: Secondary | ICD-10-CM | POA: Diagnosis not present

## 2021-10-31 DIAGNOSIS — R059 Cough, unspecified: Secondary | ICD-10-CM | POA: Diagnosis present

## 2021-10-31 DIAGNOSIS — J181 Lobar pneumonia, unspecified organism: Secondary | ICD-10-CM | POA: Insufficient documentation

## 2021-10-31 DIAGNOSIS — J189 Pneumonia, unspecified organism: Secondary | ICD-10-CM

## 2021-10-31 LAB — SARS CORONAVIRUS 2 BY RT PCR: SARS Coronavirus 2 by RT PCR: NEGATIVE

## 2021-10-31 MED ORDER — FLUTICASONE PROPIONATE 50 MCG/ACT NA SUSP
2.0000 | Freq: Every day | NASAL | 0 refills | Status: AC
  Filled 2021-10-31: qty 16, 30d supply, fill #0

## 2021-10-31 MED ORDER — AZITHROMYCIN 250 MG PO TABS
250.0000 mg | ORAL_TABLET | Freq: Every day | ORAL | 0 refills | Status: AC
  Filled 2021-10-31: qty 6, 5d supply, fill #0

## 2021-10-31 MED ORDER — BENZONATATE 100 MG PO CAPS
100.0000 mg | ORAL_CAPSULE | Freq: Three times a day (TID) | ORAL | 0 refills | Status: AC
Start: 2021-10-31 — End: ?
  Filled 2021-10-31: qty 21, 7d supply, fill #0

## 2021-10-31 NOTE — ED Provider Notes (Signed)
[facility] EMERGENCY DEPT Provider Note   CSN: [id number] Arrival date & time: 10/31/21  1143     History  Chief Complaint  Patient presents with   Cough    Heather Vargas is a 41 y.o. female here for evaluation of feeling unwell.  Over a week patient has had congestion, rhinorrhea, fatigue, cough.  Feels like she is not able to "cough it up."  Subjective fever.  No chest pain, shortness of breath, abdominal pain, lower extremity pain, swelling, PND orthopnea.  No known sick contacts.  HPI     Home Medications Prior to Admission medications   Medication Sig Start Date End Date Taking? Authorizing Provider  azithromycin (ZITHROMAX) 250 MG tablet Take 1 tablet (250 mg total) by mouth daily. Take first 2 tablets together, then 1 every day until finished. 10/31/21  Yes Jolita Haefner A, PA-C  benzonatate (TESSALON) 100 MG capsule Take 1 capsule (100 mg total) by mouth every 8 (eight) hours. 10/31/21  Yes Neysa Arts A, PA-C  fluticasone (FLONASE) 50 MCG/ACT nasal spray Place 2 sprays into both nostrils daily. 10/31/21  Yes Zakkiyya Barno A, PA-C  ARIPiprazole (ABILIFY) 2 MG tablet TAKE 1 TABLET BY MOUTH EVERY DAY 03/28/20   Pucilowski, Roosvelt Maser, MD  aspirin 81 MG chewable tablet Chew 81 mg by mouth daily.    [provider]  buPROPion (WELLBUTRIN XL) 300 MG 24 hr tablet TAKE 1 TABLET BY MOUTH EVERY DAY IN THE MORNING 04/18/20   Pucilowski, Olgierd A, MD  busPIRone (BUSPAR) 10 MG tablet Take 10 mg by mouth 2 (two) times daily. 10/22/21   [provider]  cyproheptadine (PERIACTIN) 4 MG tablet TAKE 1 TABLET 1-2 HOURS BEFORE SEXUAL ACTIVITY DAILY AS NEEDED 03/22/19   Pucilowski, Olgierd A, MD  DULoxetine (CYMBALTA) 60 MG capsule Take 2 capsules (120 mg total) by mouth daily. 04/18/20   Pucilowski, Roosvelt Maser, MD  EVENING PRIMROSE OIL PO Take 1,300 mg by mouth daily.    [provider]  hydrOXYzine (ATARAX) 50 MG tablet Take 50 mg by mouth at  bedtime. 10/22/21   [provider]  prazosin (MINIPRESS) 2 MG capsule Take 2 mg by mouth at bedtime. 10/22/21   [provider]  VRAYLAR 3 MG capsule Take 3 mg by mouth daily. 10/22/21   [provider]  zolpidem (AMBIEN CR) 12.5 MG CR tablet Take 1 tablet (12.5 mg total) by mouth at bedtime as needed for sleep. 01/18/20 07/16/20  Pucilowski, Roosvelt Maser, MD      Allergies    Patient has no known allergies.    Review of Systems   Review of Systems  Constitutional:  Positive for fatigue and fever. Negative for activity change, appetite change, chills and diaphoresis.  HENT:  Positive for congestion, postnasal drip and rhinorrhea. Negative for ear discharge, ear pain, facial swelling and sore throat.   Respiratory:  Positive for cough. Negative for choking, chest tightness, shortness of breath, wheezing and stridor.   Cardiovascular: Negative.   Gastrointestinal: Negative.   Genitourinary: Negative.   Musculoskeletal: Negative.   Skin: Negative.   Neurological: Negative.   All other systems reviewed and are negative.   Physical Exam Updated Vital Signs BP (!) 168/119 (BP Location: Left Arm)   Pulse 97   Temp 98.9 F (37.2 C) (Oral)   Resp 20   Ht 5\' 2"  (1.575 m)   Wt 130.2 kg   SpO2 100%   BMI 52.49 kg/m  Physical Exam Vitals and nursing  note reviewed.  Constitutional:      General: She is not in acute distress.    Appearance: She is well-developed. She is not ill-appearing, toxic-appearing or diaphoretic.  HENT:     Head: Normocephalic and atraumatic.     Nose: Nose normal.     Mouth/Throat:     Mouth: Mucous membranes are moist.  Eyes:     Pupils: Pupils are equal, round, and reactive to light.  Cardiovascular:     Rate and Rhythm: Normal rate.     Pulses: Normal pulses.     Heart sounds: Normal heart sounds.  Pulmonary:     Effort: No respiratory distress.     Comments: Very mild rhonchi right lower lobe.  Speaks in full sentences without  difficulty Abdominal:     General: Bowel sounds are normal. There is no distension.     Palpations: Abdomen is soft.  Musculoskeletal:        General: Normal range of motion.     Cervical back: Normal range of motion.     Comments: Compartment soft, no bony tenderness, Homan negative bilaterally  Skin:    General: Skin is warm and dry.     Capillary Refill: Capillary refill takes less than 2 seconds.  Neurological:     General: No focal deficit present.     Mental Status: She is alert and oriented to person, place, and time.  Psychiatric:        Mood and Affect: Mood normal.     ED Results / Procedures / Treatments   Labs (all labs ordered are listed, but only abnormal results are displayed) Labs Reviewed  SARS CORONAVIRUS 2 BY RT PCR    EKG None  Radiology DG Chest 2 View  Result Date: 10/31/2021 CLINICAL DATA:  Cough and fever 1 week EXAM: CHEST - 2 VIEW COMPARISON:  Chest 02/16/2018 FINDINGS: The heart size and mediastinal contours are within normal limits. Both lungs are clear. The visualized skeletal structures are unremarkable. IMPRESSION: No active cardiopulmonary disease. Electronically Signed   By: Marlan Palau M.D.   On: 10/31/2021 16:07    Procedures Procedures    Medications Ordered in ED Medications - No data to display  ED Course/ Medical Decision Making/ A&P    41 year old here for evaluation of feeling unwell for greater than 1 week.  Patient with subjective fever, congestion, rhinorrhea, cough.  Has some mild rhonchi to right lower lobe on exam.  No respiratory distress.  P.o. clear.  No obvious evidence of VTE on exam.  She is Wells criteria low risk.  Was noted to be hypertensive here. Hx of similar not on meds.  Does have history of provoked PE in pregnancy.  Have low suspicion for this at this time.   Labs and imaging personally viewed and interpreted:  COVID-negative Chest x-ray without infiltrates, cardiomegaly, pulm edema,  pneumothorax  Given mild rhonchi on exam symptoms greater than 1 week we will treat with a round of azithromycin as well symptomatic management.  Encourage close follow-up with PCP for further evaluation as well as repeat check of blood pressure.  Low suspicion for hypertensive urgency or emergency at this time.  The patient has been appropriately medically screened and/or stabilized in the ED. I have low suspicion for any other emergent medical condition which would require further screening, evaluation or treatment in the ED or require inpatient management.  Patient is hemodynamically stable and in no acute distress.  Patient able to ambulate in department prior  to ED.  Evaluation does not show acute pathology that would require ongoing or additional emergent interventions while in the emergency department or further inpatient treatment.  I have discussed the diagnosis with the patient and answered all questions.  Pain is been managed while in the emergency department and patient has no further complaints prior to discharge.  Patient is comfortable with plan discussed in room and is stable for discharge at this time.  I have discussed strict return precautions for returning to the emergency department.  Patient was encouraged to follow-up with PCP/specialist refer to at discharge.                            Medical Decision Making Amount and/or Complexity of Data Reviewed External Data Reviewed: labs, radiology and notes. Labs: ordered. Decision-making details documented in ED Course. Radiology: ordered and independent interpretation performed. Decision-making details documented in ED Course.  Risk OTC drugs. Prescription drug management. Decision regarding hospitalization. Diagnosis or treatment significantly limited by social determinants of health.          Final Clinical Impression(s) / ED Diagnoses Final diagnoses:  Community acquired pneumonia of right lower lobe of lung     Rx / DC Orders ED Discharge Orders          Ordered    azithromycin (ZITHROMAX) 250 MG tablet  Daily        10/31/21 1618    benzonatate (TESSALON) 100 MG capsule  Every 8 hours        10/31/21 1619    fluticasone (FLONASE) 50 MCG/ACT nasal spray  Daily        10/31/21 1619              Arbie Reisz A, PA-C 10/31/21 1619    Tegeler, Canary Brim, MD 11/01/21 2136

## 2021-10-31 NOTE — ED Triage Notes (Signed)
Cough, states "I don't feel good" since Thursday.

## 2021-10-31 NOTE — Discharge Instructions (Signed)
Chest x-ray was clear, your COVID test was negative however given your lung sounds it is possible you do have an early pneumonia.  We are treating you with a round of antibiotics as well as other medications to help with your symptoms.  Make sure to follow-up with a primary care provider.  Your blood pressure was elevated as well you need recheck of this.  Return for new or worsening symptoms

## 2021-11-08 ENCOUNTER — Encounter (HOSPITAL_BASED_OUTPATIENT_CLINIC_OR_DEPARTMENT_OTHER): Payer: Self-pay

## 2021-11-08 ENCOUNTER — Emergency Department (HOSPITAL_BASED_OUTPATIENT_CLINIC_OR_DEPARTMENT_OTHER): Payer: Medicaid Other

## 2021-11-08 ENCOUNTER — Emergency Department (HOSPITAL_BASED_OUTPATIENT_CLINIC_OR_DEPARTMENT_OTHER)
Admission: EM | Admit: 2021-11-08 | Discharge: 2021-11-08 | Disposition: A | Discharge status: Home / Self Care | Payer: Medicaid Other | Attending: Emergency Medicine | Admitting: Emergency Medicine

## 2021-11-08 DIAGNOSIS — K5792 Diverticulitis of intestine, part unspecified, without perforation or abscess without bleeding: Secondary | ICD-10-CM | POA: Diagnosis not present

## 2021-11-08 DIAGNOSIS — R1084 Generalized abdominal pain: Secondary | ICD-10-CM | POA: Diagnosis present

## 2021-11-08 DIAGNOSIS — Z7982 Long term (current) use of aspirin: Secondary | ICD-10-CM | POA: Insufficient documentation

## 2021-11-08 LAB — COMPREHENSIVE METABOLIC PANEL
ALT: 25 U/L (ref 0–44)
AST: 22 U/L (ref 15–41)
Albumin: 4 g/dL (ref 3.5–5.0)
Alkaline Phosphatase: 70 U/L (ref 38–126)
Anion gap: 7 (ref 5–15)
BUN: 16 mg/dL (ref 6–20)
CO2: 26 mmol/L (ref 22–32)
Calcium: 9.5 mg/dL (ref 8.9–10.3)
Chloride: 107 mmol/L (ref 98–111)
Creatinine, Ser: 0.96 mg/dL (ref 0.44–1.00)
GFR, Estimated: >60 mL/min (ref >60)
Glucose, Bld: 101 mg/dL — ABNORMAL HIGH (ref 70–99)
Potassium: 4 mmol/L (ref 3.5–5.1)
Sodium: 140 mmol/L (ref 135–145)
Total Bilirubin: 0.4 mg/dL (ref 0.3–1.2)
Total Protein: 7.2 g/dL (ref 6.5–8.1)

## 2021-11-08 LAB — CBC
HCT: 39.2 % (ref 36.0–46.0)
Hemoglobin: 12.5 g/dL (ref 12.0–15.0)
MCH: 28.9 pg (ref 26.0–34.0)
MCHC: 31.9 g/dL (ref 30.0–36.0)
MCV: 90.7 fL (ref 80.0–100.0)
Platelets: 312 10*3/uL (ref 150–400)
RBC: 4.32 MIL/uL (ref 3.87–5.11)
RDW: 13.5 % (ref 11.5–15.5)
WBC: 5.2 10*3/uL (ref 4.0–10.5)
nRBC: 0 % (ref 0.0–0.2)

## 2021-11-08 LAB — LIPASE, BLOOD: Lipase: 57 U/L — ABNORMAL HIGH (ref 11–51)

## 2021-11-08 LAB — HCG, SERUM, QUALITATIVE: Preg, Serum: NEGATIVE

## 2021-11-08 MED ORDER — FLUCONAZOLE 200 MG PO TABS: ORAL_TABLET | ORAL | 0 refills | Status: AC

## 2021-11-08 MED ORDER — DICYCLOMINE HCL 20 MG PO TABS: 20.0000 mg | ORAL_TABLET | Freq: Two times a day (BID) | ORAL | 0 refills | Status: AC

## 2021-11-08 MED ORDER — METRONIDAZOLE 500 MG PO TABS: 500.0000 mg | ORAL_TABLET | Freq: Two times a day (BID) | ORAL | 0 refills | Status: AC

## 2021-11-08 MED ORDER — CIPROFLOXACIN HCL 500 MG PO TABS
500.0000 mg | ORAL_TABLET | Freq: Two times a day (BID) | ORAL | 0 refills | Status: AC
Start: 2021-11-08 — End: ?

## 2021-11-08 MED ORDER — METRONIDAZOLE 500 MG PO TABS
500.0000 mg | ORAL_TABLET | Freq: Once | ORAL | Status: AC
Start: 2021-11-08 — End: 2021-11-08
  Administered 2021-11-08: 500 mg via ORAL
  Filled 2021-11-08: qty 1

## 2021-11-08 MED ORDER — ONDANSETRON HCL 4 MG/2ML IJ SOLN
4.0000 mg | Freq: Once | INTRAMUSCULAR | Status: AC
  Administered 2021-11-08: 4 mg via INTRAVENOUS
  Filled 2021-11-08: qty 2

## 2021-11-08 MED ORDER — ONDANSETRON HCL 4 MG PO TABS: 4.0000 mg | ORAL_TABLET | Freq: Four times a day (QID) | ORAL | 0 refills | Status: AC

## 2021-11-08 MED ORDER — ONDANSETRON 4 MG PO TBDP
4.0000 mg | ORAL_TABLET | Freq: Once | ORAL | Status: AC
  Administered 2021-11-08: 4 mg via ORAL
  Filled 2021-11-08: qty 1

## 2021-11-08 MED ORDER — IOHEXOL 300 MG/ML  SOLN
100.0000 mL | Freq: Once | INTRAMUSCULAR | Status: AC | PRN
  Administered 2021-11-08: 100 mL via INTRAVENOUS

## 2021-11-08 MED ORDER — CIPROFLOXACIN HCL 500 MG PO TABS
500.0000 mg | ORAL_TABLET | Freq: Once | ORAL | Status: AC
Start: 2021-11-08 — End: 2021-11-08
  Administered 2021-11-08: 500 mg via ORAL
  Filled 2021-11-08: qty 1

## 2021-11-08 MED ORDER — MORPHINE SULFATE (PF) 4 MG/ML IV SOLN
4.0000 mg | Freq: Once | INTRAVENOUS | Status: AC
  Administered 2021-11-08: 4 mg via INTRAVENOUS
  Filled 2021-11-08: qty 1

## 2021-11-08 MED ORDER — DICYCLOMINE HCL 10 MG PO CAPS
10.0000 mg | ORAL_CAPSULE | Freq: Once | ORAL | Status: AC
  Administered 2021-11-08: 10 mg via ORAL
  Filled 2021-11-08: qty 1

## 2021-11-08 MED ORDER — OXYCODONE HCL 5 MG PO TABS: 5.0000 mg | ORAL_TABLET | ORAL | 0 refills | Status: AC | PRN

## 2021-11-08 NOTE — ED Notes (Signed)
Patient transported to CT 

## 2021-11-08 NOTE — ED Provider Notes (Signed)
[facility] EMER[facility]ENC[facility] DEPT Provider Note   CSN: [id number] Arrival date & time: 11/08/21  1836    History  Chief Complaint  Patient presents with   Abdominal Pain    Heather Vargas is a 41 y.o. female with recent diagnosis of pneumonia here for evaluation of abdominal pain.  Diagnosed with possible pneumonia approxi-1 week ago.  Complete azithromycin prescription.  Cough congestion and rhinorrhea resolved.  Subsequently states she has had some generalized sharp abdominal pain.  Does not radiate.  She has had some nausea without vomiting.  Few episodes of loose stool.  No melena bright blood per rectum.  No fever, headache.  No dysuria, hematuria.  Has not taking anything for her symptoms. No prior abd surgeries  HPI     Home Medications Prior to Admission medications   Medication Sig Start Date End Date Taking? Authorizing Provider  ciprofloxacin (CIPRO) 500 MG tablet Take 1 tablet (500 mg total) by mouth every 12 (twelve) hours. 11/08/21  Yes Caya Soberanis A, PA-C  dicyclomine (BENTYL) 20 MG tablet Take 1 tablet (20 mg total) by mouth 2 (two) times daily. 11/08/21  Yes Delsa Walder A, PA-C  fluconazole (DIFLUCAN) 200 MG tablet Take 1 tablet when filling the prescription.  If you develop persistent symptoms after 3 days take additional tablet 11/08/21  Yes Magdalena Skilton A, PA-C  metroNIDAZOLE (FLAGYL) 500 MG tablet Take 1 tablet (500 mg total) by mouth 2 (two) times daily. 11/08/21  Yes Maisley Hainsworth A, PA-C  ondansetron (ZOFRAN) 4 MG tablet Take 1 tablet (4 mg total) by mouth every 6 (six) hours. 11/08/21  Yes Kathren Scearce A, PA-C  oxyCODONE (ROXICODONE) 5 MG immediate release tablet Take 1 tablet (5 mg total) by mouth every 4 (four) hours as needed for severe pain. 11/08/21  Yes Konnor Jorden A, PA-C  ARIPiprazole (ABILIFY) 2 MG tablet TAKE 1 TABLET BY MOUTH EVERY DAY 03/28/20   Pucilowski, Marchia Bond, MD  aspirin 81 MG chewable tablet Chew 81 mg by mouth  daily.    [provider]  azithromycin (ZITHROMAX) 250 MG tablet Take 1 tablet (250 mg total) by mouth daily. Take first 2 tablets together, then 1 every day until finished. 10/31/21   Ryann Leavitt A, PA-C  benzonatate (TESSALON) 100 MG capsule Take 1 capsule (100 mg total) by mouth every 8 (eight) hours. 10/31/21   Bram Hottel A, PA-C  buPROPion (WELLBUTRIN XL) 300 MG 24 hr tablet TAKE 1 TABLET BY MOUTH EVERY DAY IN THE MORNING 04/18/20   Pucilowski, Olgierd A, MD  busPIRone (BUSPAR) 10 MG tablet Take 10 mg by mouth 2 (two) times daily. 10/22/21   [provider]  cyproheptadine (PERIACTIN) 4 MG tablet TAKE 1 TABLET 1-2 HOURS BEFORE SEXUAL ACTIVITY DAILY AS NEEDED 03/22/19   Pucilowski, Olgierd A, MD  DULoxetine (CYMBALTA) 60 MG capsule Take 2 capsules (120 mg total) by mouth daily. 04/18/20   Pucilowski, Marchia Bond, MD  EVENING PRIMROSE OIL PO Take 1,300 mg by mouth daily.    [provider]  fluticasone (FLONASE) 50 MCG/ACT nasal spray Place 2 sprays into both nostrils daily. 10/31/21   Zaliyah Meikle A, PA-C  hydrOXYzine (ATARAX) 50 MG tablet Take 50 mg by mouth at bedtime. 10/22/21   [provider]  prazosin (MINIPRESS) 2 MG capsule Take 2 mg by mouth at bedtime. 10/22/21   [provider]  VRAYLAR 3 MG capsule Take 3 mg by mouth daily. 10/22/21   [provider]  zolpidem (  AMBIEN CR) 12.5 MG CR tablet Take 1 tablet (12.5 mg total) by mouth at bedtime as needed for sleep. 01/18/20 07/16/20  Pucilowski, Roosvelt Maser, MD      Allergies    Patient has no known allergies.    Review of Systems   Review of Systems  Constitutional: Negative.   HENT: Negative.    Respiratory: Negative.    Cardiovascular: Negative.   Gastrointestinal:  Positive for abdominal pain, diarrhea, nausea and vomiting. Negative for abdominal distention, anal bleeding, blood in stool, constipation and rectal pain.  Genitourinary: Negative.   Musculoskeletal: Negative.    Skin: Negative.   Neurological: Negative.   All other systems reviewed and are negative.   Physical Exam Updated Vital Signs BP (!) 156/79   Pulse 86   Temp 98.3 F (36.8 C) (Oral)   Resp 17   Ht 5\' 2"  (1.575 m)   Wt 130.2 kg   SpO2 100%   BMI 52.49 kg/m  Physical Exam Vitals and nursing note reviewed.  Constitutional:      General: She is not in acute distress.    Appearance: She is well-developed. She is obese. She is not ill-appearing, toxic-appearing or diaphoretic.  HENT:     Head: Normocephalic and atraumatic.  Eyes:     Pupils: Pupils are equal, round, and reactive to light.  Cardiovascular:     Rate and Rhythm: Normal rate.     Heart sounds: Normal heart sounds.  Pulmonary:     Effort: Pulmonary effort is normal. No respiratory distress.     Breath sounds: Normal breath sounds.  Abdominal:     General: Bowel sounds are normal. There is no distension.     Palpations: Abdomen is soft.     Tenderness: There is generalized abdominal tenderness. There is no right CVA tenderness, left CVA tenderness, guarding or rebound. Negative signs include Murphy's sign and McBurney's sign.     Hernia: No hernia is present.  Musculoskeletal:        General: Normal range of motion.     Cervical back: Normal range of motion.  Skin:    General: Skin is warm and dry.  Neurological:     General: No focal deficit present.     Mental Status: She is alert.  Psychiatric:        Mood and Affect: Mood normal.     ED Results / Procedures / Treatments   Labs (all labs ordered are listed, but only abnormal results are displayed) Labs Reviewed  LIPASE, BLOOD - Abnormal; Notable for the following components:      Result Value   Lipase 57 (*)    All other components within normal limits  COMPREHENSIVE METABOLIC PANEL - Abnormal; Notable for the following components:   Glucose, Bld 101 (*)    All other components within normal limits  CBC  HCG, SERUM, QUALITATIVE  URINALYSIS,  ROUTINE W REFLEX MICROSCOPIC    EKG None  Radiology CT Abdomen Pelvis W Contrast  Result Date: 11/08/2021 CLINICAL DATA:  Abdominal pain, acute, nonlocalized EXAM: CT ABDOMEN AND PELVIS WITH CONTRAST TECHNIQUE: Multidetector CT imaging of the abdomen and pelvis was performed using the standard protocol following bolus administration of intravenous contrast. RADIATION DOSE REDUCTION: This exam was performed according to the departmental dose-optimization program which includes automated exposure control, adjustment of the mA and/or kV according to patient size and/or use of iterative reconstruction technique. CONTRAST:  01/08/2022 OMNIPAQUE IOHEXOL 300 MG/ML  SOLN COMPARISON:  CTA chest 02/17/2018 FINDINGS:  Lower chest: No pleural or pericardial effusion. Visualized lung bases clear. Hepatobiliary: No focal liver abnormality is seen. No gallstones, gallbladder wall thickening, or biliary dilatation. Pancreas: Unremarkable. No pancreatic ductal dilatation or surrounding inflammatory changes. Spleen: Normal in size without focal abnormality. Adrenals/Urinary Tract: Adrenal glands are unremarkable. Kidneys are normal, without renal calculi, focal lesion, or hydronephrosis. Bladder is unremarkable. Stomach/Bowel: Stomach is nondistended. Small bowel decompressed. Normal appendix. The colon is incompletely distended. There are multiple sigmoid diverticula, with mild adjacent edematous changes around the mid sigmoid colon. No discrete abscess. Vascular/Lymphatic: No significant vascular findings are present. No enlarged abdominal or pelvic lymph nodes. Reproductive: Bilateral Essure microinserts. uterus and bilateral adnexa otherwise unremarkable. Other: Bilateral pelvic phleboliths.  No ascites.  No free air. Musculoskeletal: No acute or significant osseous findings. IMPRESSION: Probable sigmoid diverticulitis, without abscess. Electronically Signed   By: Corlis Leak M.D.   On: 11/08/2021 20:28     Procedures Procedures    Medications Ordered in ED Medications  ondansetron (ZOFRAN) injection 4 mg (4 mg Intravenous Given 11/08/21 1936)  morphine (PF) 4 MG/ML injection 4 mg (4 mg Intravenous Given 11/08/21 1937)  iohexol (OMNIPAQUE) 300 MG/ML solution 100 mL (100 mLs Intravenous Contrast Given 11/08/21 2006)  ciprofloxacin (CIPRO) tablet 500 mg (500 mg Oral Given 11/08/21 2115)  metroNIDAZOLE (FLAGYL) tablet 500 mg (500 mg Oral Given 11/08/21 2115)  dicyclomine (BENTYL) capsule 10 mg (10 mg Oral Given 11/08/21 2121)  ondansetron (ZOFRAN-ODT) disintegrating tablet 4 mg (4 mg Oral Given 11/08/21 2121)    ED Course/ Medical Decision Making/ A&P    40 year old here for evaluation of 1 week of generalized abdominal pain and nausea.  No emesis.  Has had some intermittent loose stools, melena blood per rectum.  Was given azithromycin last week for pneumonia, COVID test was negative.  No urinary symptoms.  Denies chance of pregnancy.  Cough resolved with azithromycin.   Labs and imaging personally viewed and interpreted:  CBC without leukocytosis Metabolic panel without significant Pregnancy test negative lipase 57 CT abdomen pelvis possible diverticulitis.  Patient reassessed.  Still has some cramping abdominal pain.  No vomiting here in the emergency department.  She was given antibiotics for possible diverticulitis.  Will DC home with symptomatic management.  No abscess, perforation on her imaging.  Encouraged close follow-up with PCP as well as possible GI consult as she is awfully young to have diverticulitis at 4.  She has no history of IBD personal or family wise.  Return for new or worsening symptoms.  The patient has been appropriately medically screened and/or stabilized in the ED. I have low suspicion for any other emergent medical condition which would require further screening, evaluation or treatment in the ED or require inpatient management.  Patient is hemodynamically stable  and in no acute distress.  Patient able to ambulate in department prior to ED.  Evaluation does not show acute pathology that would require ongoing or additional emergent interventions while in the emergency department or further inpatient treatment.  I have discussed the diagnosis with the patient and answered all questions.  Pain is been managed while in the emergency department and patient has no further complaints prior to discharge.  Patient is comfortable with plan discussed in room and is stable for discharge at this time.  I have discussed strict return precautions for returning to the emergency department.  Patient was encouraged to follow-up with PCP/specialist refer to at discharge.  Medical Decision Making Amount and/or Complexity of Data Reviewed External Data Reviewed: labs, radiology and notes. Labs: ordered. Decision-making details documented in ED Course. Radiology: ordered and independent interpretation performed. Decision-making details documented in ED Course.  Risk OTC drugs. Prescription drug management. Parenteral controlled substances. Decision regarding hospitalization. Diagnosis or treatment significantly limited by social determinants of health.         Final Clinical Impression(s) / ED Diagnoses Final diagnoses:  Diverticulitis    Rx / DC Orders ED Discharge Orders          Ordered    ciprofloxacin (CIPRO) 500 MG tablet  Every 12 hours        11/08/21 2111    metroNIDAZOLE (FLAGYL) 500 MG tablet  2 times daily        11/08/21 2111    dicyclomine (BENTYL) 20 MG tablet  2 times daily        11/08/21 2111    ondansetron (ZOFRAN) 4 MG tablet  Every 6 hours        11/08/21 2111    oxyCODONE (ROXICODONE) 5 MG immediate release tablet  Every 4 hours PRN        11/08/21 2111    fluconazole (DIFLUCAN) 200 MG tablet        11/08/21 2118              Anjani Feuerborn A, PA-C 11/08/21 2133    Lonell Grandchild,  MD 11/08/21 2252

## 2021-11-08 NOTE — ED Notes (Signed)
Pt tolerated 2 ginger ales without difficulty, passed fluid challenge.

## 2021-11-08 NOTE — ED Triage Notes (Signed)
Pt states since starting azithromycin last week, she has had generalized abdominal pain and nausea. Small amt of diarrhea. Denies emesis.

## 2021-11-08 NOTE — ED Notes (Signed)
Patient attempted to void without success.

## 2021-11-08 NOTE — Discharge Instructions (Addendum)
Your CT scan showed possible diverticulitis.  Is inflammation of the colon.  I written you for a few medications to help with your symptoms.  One of them is a narcotic prescription.  Only take as needed for pain.  The Bentyl is an antispasm medication that should help with your pain as well.  Zofran as needed for nausea and vomiting.  Cipro and Flagyl on antibiotics, Diflucan as needed for yeast  Make sure to follow-up with your primary care provider.  May possibly need referral to GI if still having symptoms.  Return for new or worsening symptoms

## 2021-11-18 ENCOUNTER — Emergency Department (HOSPITAL_COMMUNITY)
Admission: EM | Admit: 2021-11-18 | Discharge: 2021-11-19 | Discharge status: Against Medical Advice | Payer: Medicaid Other | Attending: Emergency Medicine | Admitting: Emergency Medicine

## 2021-11-18 ENCOUNTER — Encounter (HOSPITAL_COMMUNITY): Payer: Self-pay | Admitting: Emergency Medicine

## 2021-11-18 DIAGNOSIS — Z5321 Procedure and treatment not carried out due to patient leaving prior to being seen by health care provider: Secondary | ICD-10-CM | POA: Diagnosis not present

## 2021-11-18 DIAGNOSIS — R110 Nausea: Secondary | ICD-10-CM | POA: Insufficient documentation

## 2021-11-18 DIAGNOSIS — R1032 Left lower quadrant pain: Secondary | ICD-10-CM | POA: Diagnosis present

## 2021-11-18 LAB — CBC WITH DIFFERENTIAL/PLATELET
Abs Immature Granulocytes: 0.01 10*3/uL (ref 0.00–0.07)
Basophils Absolute: 0 10*3/uL (ref 0.0–0.1)
Basophils Relative: 1 %
Eosinophils Absolute: 0.1 10*3/uL (ref 0.0–0.5)
Eosinophils Relative: 1 %
HCT: 41.2 % (ref 36.0–46.0)
Hemoglobin: 13 g/dL (ref 12.0–15.0)
Immature Granulocytes: 0 %
Lymphocytes Relative: 41 %
Lymphs Abs: 2.6 10*3/uL (ref 0.7–4.0)
MCH: 29.3 pg (ref 26.0–34.0)
MCHC: 31.6 g/dL (ref 30.0–36.0)
MCV: 92.8 fL (ref 80.0–100.0)
Monocytes Absolute: 0.3 10*3/uL (ref 0.1–1.0)
Monocytes Relative: 4 %
Neutro Abs: 3.4 10*3/uL (ref 1.7–7.7)
Neutrophils Relative %: 53 %
Platelets: 315 10*3/uL (ref 150–400)
RBC: 4.44 MIL/uL (ref 3.87–5.11)
RDW: 13.4 % (ref 11.5–15.5)
WBC: 6.4 10*3/uL (ref 4.0–10.5)
nRBC: 0 % (ref 0.0–0.2)

## 2021-11-18 LAB — URINALYSIS, ROUTINE W REFLEX MICROSCOPIC
Bilirubin Urine: NEGATIVE
Glucose, UA: NEGATIVE mg/dL
Hgb urine dipstick: NEGATIVE
Ketones, ur: NEGATIVE mg/dL
Nitrite: NEGATIVE
Protein, ur: NEGATIVE mg/dL
Specific Gravity, Urine: 1.016 (ref 1.005–1.030)
pH: 6 (ref 5.0–8.0)

## 2021-11-18 LAB — COMPREHENSIVE METABOLIC PANEL
ALT: 25 U/L (ref 0–44)
AST: 21 U/L (ref 15–41)
Albumin: 3.6 g/dL (ref 3.5–5.0)
Alkaline Phosphatase: 63 U/L (ref 38–126)
Anion gap: 8 (ref 5–15)
BUN: 12 mg/dL (ref 6–20)
CO2: 24 mmol/L (ref 22–32)
Calcium: 8.8 mg/dL — ABNORMAL LOW (ref 8.9–10.3)
Chloride: 104 mmol/L (ref 98–111)
Creatinine, Ser: 0.88 mg/dL (ref 0.44–1.00)
GFR, Estimated: >60 mL/min (ref >60)
Glucose, Bld: 103 mg/dL — ABNORMAL HIGH (ref 70–99)
Potassium: 3.8 mmol/L (ref 3.5–5.1)
Sodium: 136 mmol/L (ref 135–145)
Total Bilirubin: 0.5 mg/dL (ref 0.3–1.2)
Total Protein: 7.1 g/dL (ref 6.5–8.1)

## 2021-11-18 LAB — LIPASE, BLOOD: Lipase: 29 U/L (ref 11–51)

## 2021-11-18 NOTE — ED Provider Triage Note (Signed)
Emergency Medicine Provider Triage Evaluation Note  Heather Vargas , a 41 y.o. female  was evaluated in triage.  Pt complains of abd pain. LLQ pain over 1 week.  Was diagnosed with diverticulitis and currently on cipro/flagyl and not getting better.  Endorse nausea, no fever, dysuria or blood in stool  Review of Systems  Positive: As above Negative: As above  Physical Exam  BP (!) 181/110 (BP Location: Right Arm)   Pulse 98   Temp 98.9 F (37.2 C) (Oral)   Resp 20   LMP  (LMP Unknown)   SpO2 100%  Gen:   Awake, no distress   Resp:  Normal effort  MSK:   Moves extremities without difficulty  Other:    Medical Decision Making  Medically screening exam initiated at 6:39 PM.  Appropriate orders placed.  Heather C Mairena was informed that the remainder of the evaluation will be completed by another provider, this initial triage assessment does not replace that evaluation, and the importance of remaining in the ED until their evaluation is complete.     Domenic Moras, PA-C 11/18/21 1841

## 2021-11-18 NOTE — ED Triage Notes (Signed)
Patient here with complaint of sharp left abdominal pain that started a few days ago, history of diverticulitis. Patient seen at urgent care for same last week and was prescribed a laxative but reports no improvement in pain.

## 2021-11-18 NOTE — ED Notes (Signed)
Pt left AMA °

## 2022-09-27 IMAGING — CT CT HEAD W/O CM
4 series · 17 of 47 positions shown, 19 images · non-contrast
Comparison: CT head 02/26/2009

CLINICAL DATA: Dizziness, non-specific Headache, new or worsening,
neuro deficit (Age 19-49y) new HA, HTN, dizziness x 2 weeks

EXAM:
CT HEAD WITHOUT CONTRAST
TECHNIQUE: Contiguous axial images were obtained from the base of the skull
through the vertex without intravenous contrast.

[Series 2: head wo · axial · 0.47mm/px · z∈[+1161,+1281]mm · 7 of 34 slices shown, 9 images]
[im 5/34  brain]
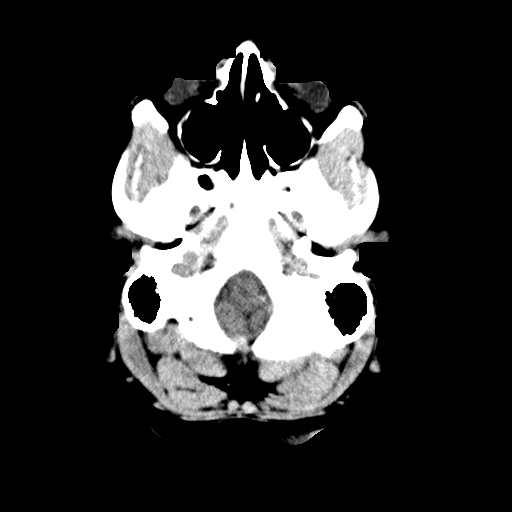
[im 5/34  bone]
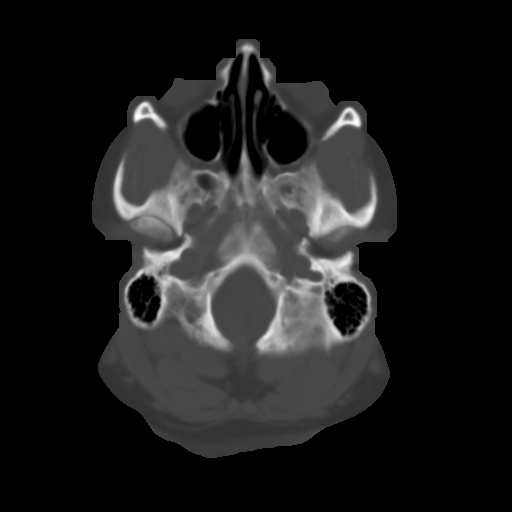
[im 9/34  brain]
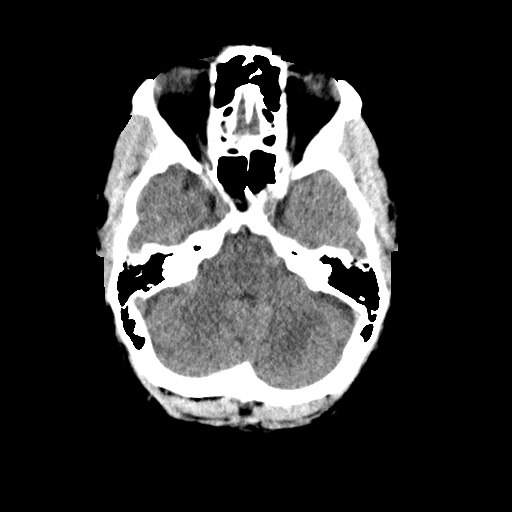
[im 13/34  brain]
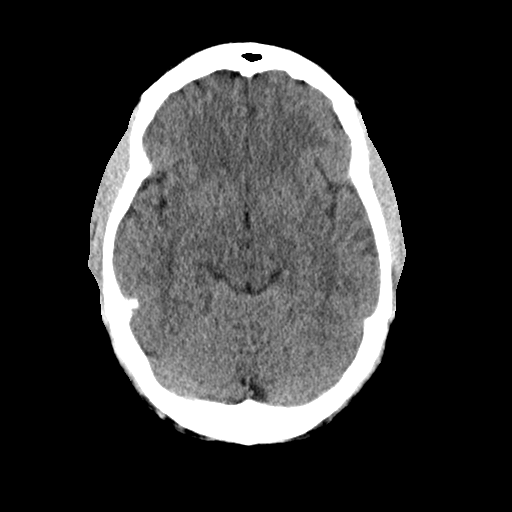
[im 17/34  brain]
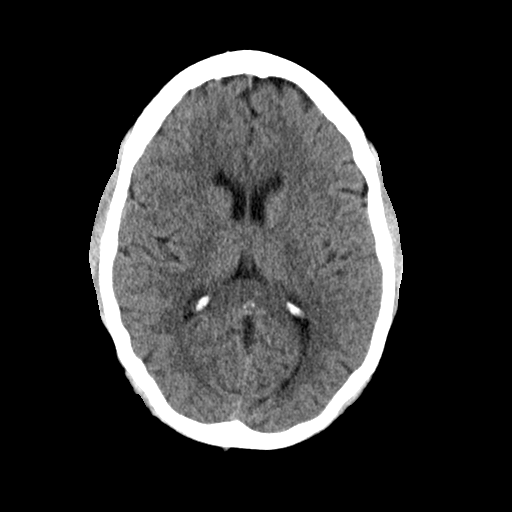
[im 21/34  brain]
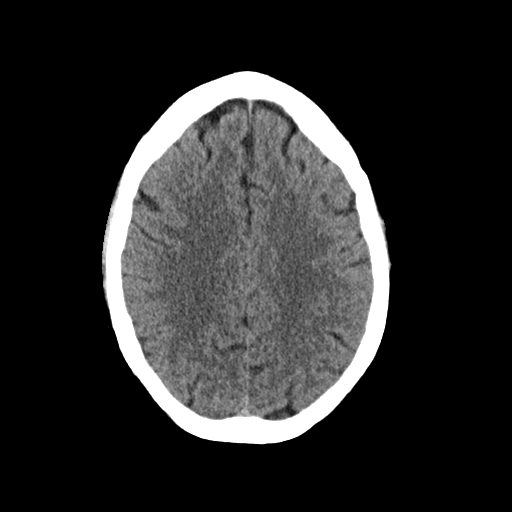
[im 21/34  bone]
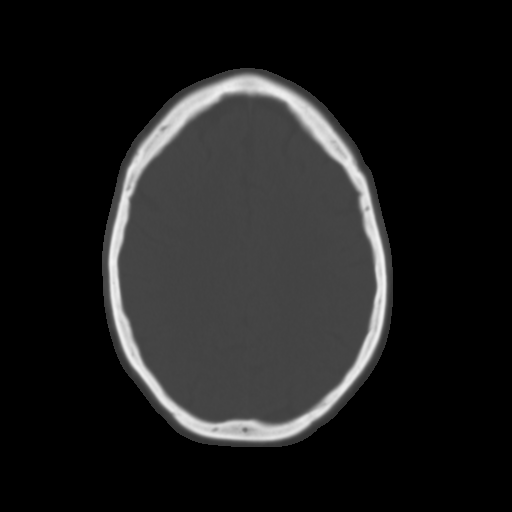
[im 25/34  brain]
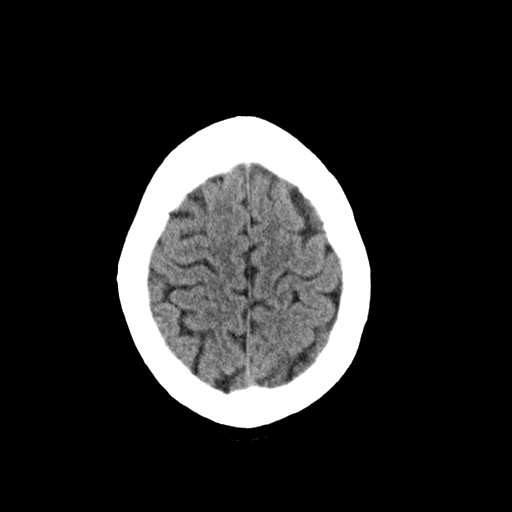
[im 29/34  brain]
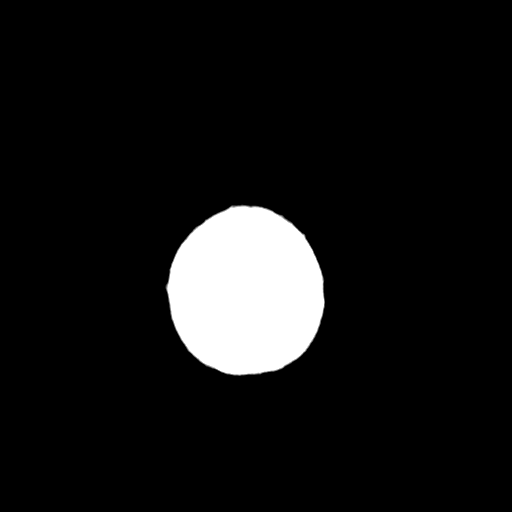

[Series 3: head bone · axial · 0.47mm/px · z∈[+1157,+1215]mm · 4 of 85 slices shown]
[im 9/85  bone]
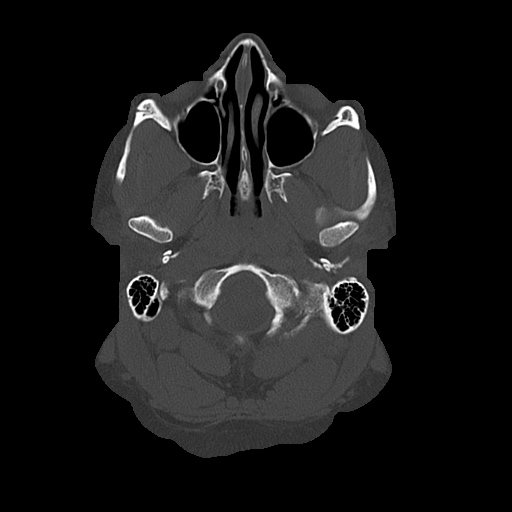
[im 17/85  bone]
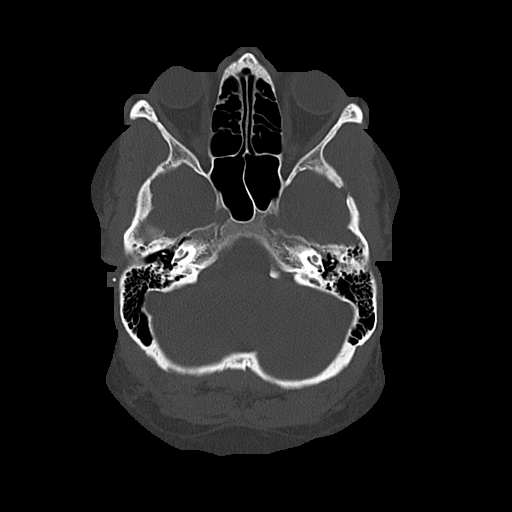
[im 26/85  bone]
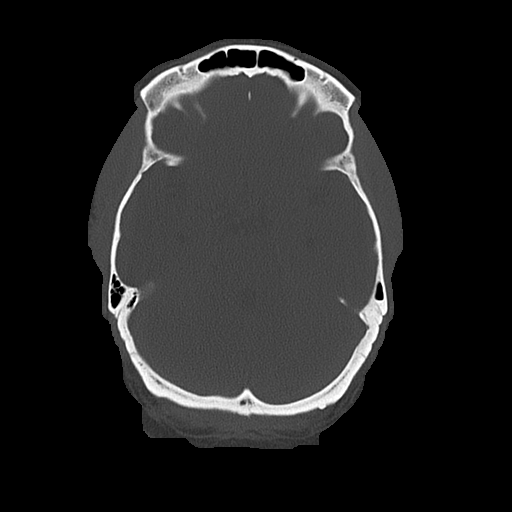
[im 38/85  bone]
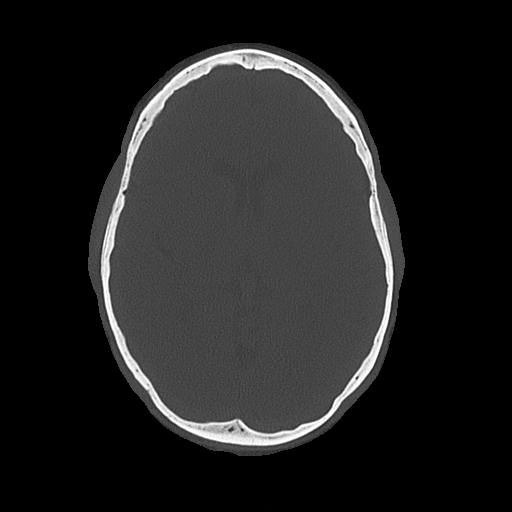

[Series 4: coronal soft · coronal · 0.33mm/px · 3 of 72 slices shown]
[im 24/72  brain]
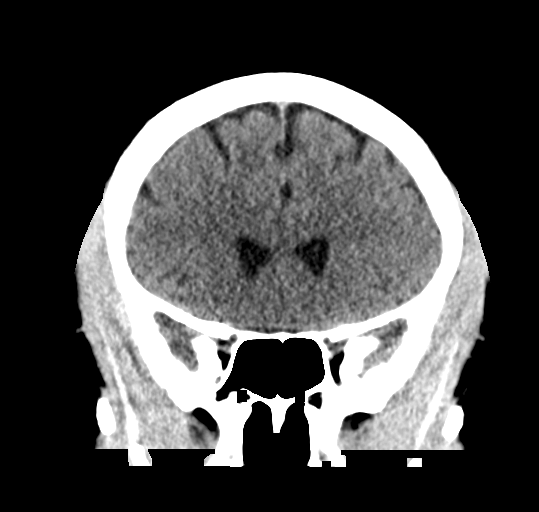
[im 32/72  brain]
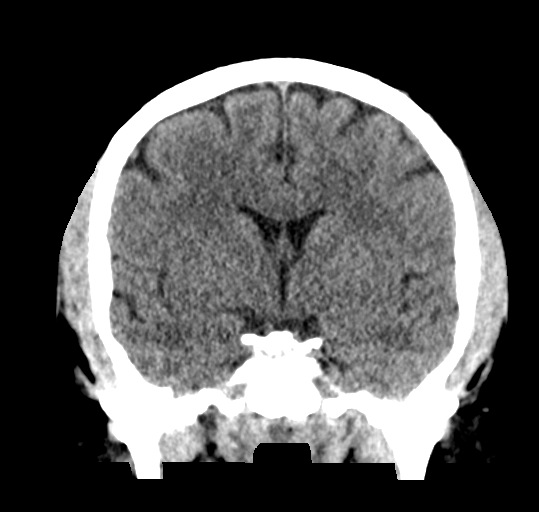
[im 40/72  brain]
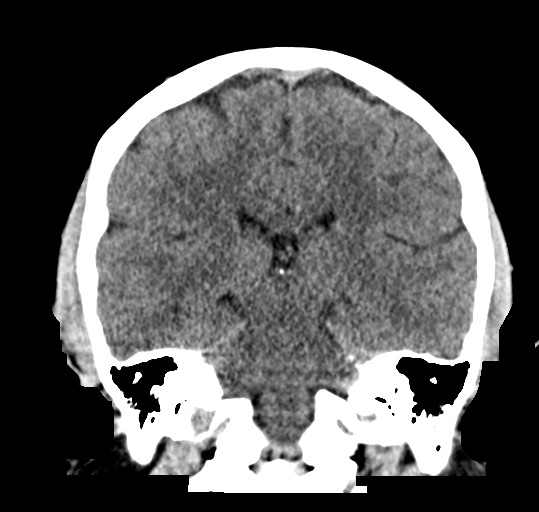

[Series 5: sagittal soft · sagittal · 0.33mm/px · 3 of 60 slices shown]
[im 20/60  brain]
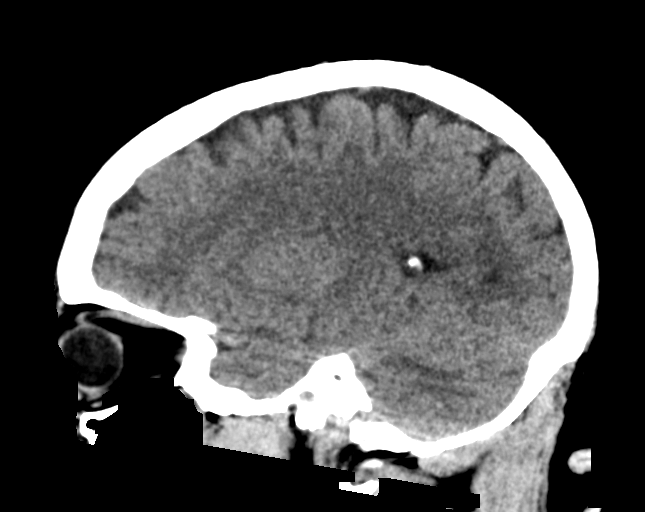
[im 30/60  brain]
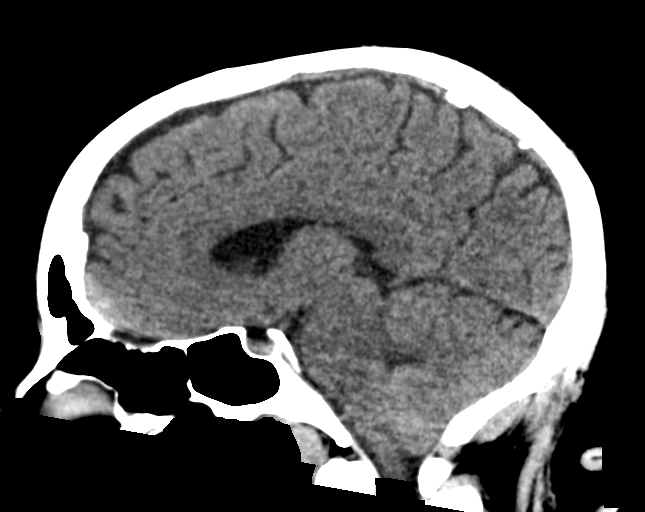
[im 40/60  brain]
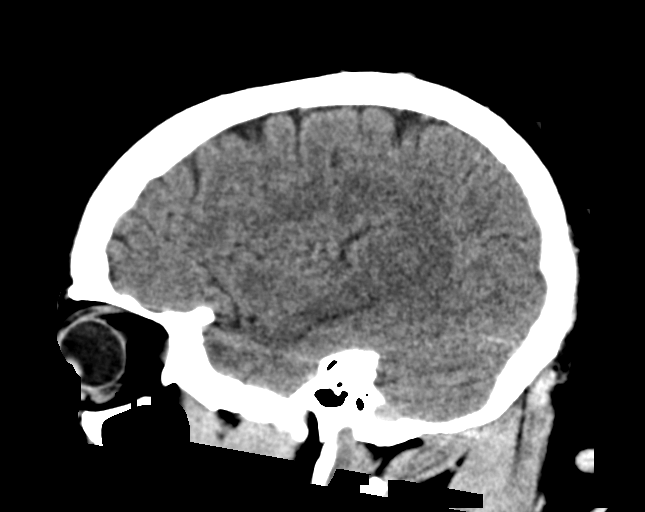

[17 of 47 positions shown; findings below may reference images not displayed]

FINDINGS: Brain:

No evidence of large-territorial acute infarction. No parenchymal
hemorrhage. No mass lesion. No extra-axial collection.

No mass effect or midline shift. No hydrocephalus. Basilar cisterns
are patent.

Vascular: No hyperdense vessel.

Skull: No acute fracture or focal lesion.

Sinuses/Orbits: Paranasal sinuses and mastoid air cells are clear.
The orbits are unremarkable.

Other: None.
IMPRESSION: No acute intracranial abnormality.

## 2023-09-02 ENCOUNTER — Other Ambulatory Visit: Payer: Self-pay

## 2023-09-02 ENCOUNTER — Encounter (HOSPITAL_BASED_OUTPATIENT_CLINIC_OR_DEPARTMENT_OTHER): Payer: Self-pay | Admitting: Emergency Medicine

## 2023-09-02 ENCOUNTER — Emergency Department (HOSPITAL_BASED_OUTPATIENT_CLINIC_OR_DEPARTMENT_OTHER): Payer: Self-pay

## 2023-09-02 ENCOUNTER — Emergency Department (HOSPITAL_BASED_OUTPATIENT_CLINIC_OR_DEPARTMENT_OTHER)
Admission: EM | Admit: 2023-09-02 | Discharge: 2023-09-02 | Disposition: A | Discharge status: Home / Self Care | Payer: Self-pay | Attending: Emergency Medicine | Admitting: Emergency Medicine

## 2023-09-02 DIAGNOSIS — Z7982 Long term (current) use of aspirin: Secondary | ICD-10-CM | POA: Insufficient documentation

## 2023-09-02 DIAGNOSIS — K5792 Diverticulitis of intestine, part unspecified, without perforation or abscess without bleeding: Secondary | ICD-10-CM

## 2023-09-02 DIAGNOSIS — K5732 Diverticulitis of large intestine without perforation or abscess without bleeding: Secondary | ICD-10-CM | POA: Insufficient documentation

## 2023-09-02 LAB — COMPREHENSIVE METABOLIC PANEL WITH GFR
ALT: 33 U/L (ref 0–44)
AST: 25 U/L (ref 15–41)
Albumin: 4.2 g/dL (ref 3.5–5.0)
Alkaline Phosphatase: 91 U/L (ref 38–126)
Anion gap: 11 (ref 5–15)
BUN: 10 mg/dL (ref 6–20)
CO2: 26 mmol/L (ref 22–32)
Calcium: 9.9 mg/dL (ref 8.9–10.3)
Chloride: 102 mmol/L (ref 98–111)
Creatinine, Ser: 1 mg/dL (ref 0.44–1.00)
GFR, Estimated: >60 mL/min (ref >60)
Glucose, Bld: 136 mg/dL — ABNORMAL HIGH (ref 70–99)
Potassium: 3.7 mmol/L (ref 3.5–5.1)
Sodium: 138 mmol/L (ref 135–145)
Total Bilirubin: 0.6 mg/dL (ref 0.0–1.2)
Total Protein: 8.2 g/dL — ABNORMAL HIGH (ref 6.5–8.1)

## 2023-09-02 LAB — CBC
HCT: 40.2 % (ref 36.0–46.0)
Hemoglobin: 12.9 g/dL (ref 12.0–15.0)
MCH: 29.2 pg (ref 26.0–34.0)
MCHC: 32.1 g/dL (ref 30.0–36.0)
MCV: 91 fL (ref 80.0–100.0)
Platelets: 300 K/uL (ref 150–400)
RBC: 4.42 MIL/uL (ref 3.87–5.11)
RDW: 12.6 % (ref 11.5–15.5)
WBC: 6.9 K/uL (ref 4.0–10.5)
nRBC: 0 % (ref 0.0–0.2)

## 2023-09-02 LAB — LIPASE, BLOOD: Lipase: 22 U/L (ref 11–51)

## 2023-09-02 MED ORDER — CIPROFLOXACIN HCL 500 MG PO TABS
500.0000 mg | ORAL_TABLET | Freq: Once | ORAL | Status: AC
  Administered 2023-09-02: 500 mg via ORAL
  Filled 2023-09-02: qty 1

## 2023-09-02 MED ORDER — METRONIDAZOLE 500 MG PO TABS: 500.0000 mg | ORAL_TABLET | Freq: Three times a day (TID) | ORAL | 0 refills | Status: AC

## 2023-09-02 MED ORDER — FLUCONAZOLE 200 MG PO TABS: 200.0000 mg | ORAL_TABLET | Freq: Every day | ORAL | 0 refills | Status: AC | PRN

## 2023-09-02 MED ORDER — CIPROFLOXACIN HCL 500 MG PO TABS: 500.0000 mg | ORAL_TABLET | Freq: Two times a day (BID) | ORAL | 0 refills | Status: AC

## 2023-09-02 MED ORDER — METRONIDAZOLE 500 MG PO TABS
500.0000 mg | ORAL_TABLET | Freq: Once | ORAL | Status: AC
  Administered 2023-09-02: 500 mg via ORAL
  Filled 2023-09-02: qty 1

## 2023-09-02 NOTE — ED Provider Notes (Signed)
 [place] EMERGENCY DEPARTMENT AT [facility] Provider Note   CSN: [id number] Arrival date & time: 09/02/23  1921     Patient presents with: Abdominal Pain   Heather Vargas is a 43 y.o. female w/ hx of obesity, diverticulitis, here with left lower abdominal pain for several days, worse today, with blood in her stool.  Recently diagnosed with BV but hasn't started treatment.  No nausea, vomiting.    HPI     Prior to Admission medications   Medication Sig Start Date End Date Taking? Authorizing Provider  ciprofloxacin  (CIPRO ) 500 MG tablet Take 1 tablet (500 mg total) by mouth every 12 (twelve) hours for 7 days. 09/03/23 09/10/23 Yes Ennis Delpozo, Donnice PARAS, MD  fluconazole  (DIFLUCAN ) 200 MG tablet Take 1 tablet (200 mg total) by mouth daily as needed for up to 2 doses. 09/02/23  Yes Akiera Allbaugh, Donnice PARAS, MD  metroNIDAZOLE  (FLAGYL ) 500 MG tablet Take 1 tablet (500 mg total) by mouth 3 (three) times daily for 7 days. 09/03/23 09/10/23 Yes Loreta Blouch, Donnice PARAS, MD  ARIPiprazole  (ABILIFY ) 2 MG tablet TAKE 1 TABLET BY MOUTH EVERY DAY 03/28/20   Pucilowski, Olgierd A, MD  aspirin 81 MG chewable tablet Chew 81 mg by mouth daily.    [provider]  azithromycin  (ZITHROMAX ) 250 MG tablet Take 1 tablet (250 mg total) by mouth daily. Take first 2 tablets together, then 1 every day until finished. 10/31/21   Henderly, Britni A, PA-C  benzonatate  (TESSALON ) 100 MG capsule Take 1 capsule (100 mg total) by mouth every 8 (eight) hours. 10/31/21   Henderly, Britni A, PA-C  buPROPion  (WELLBUTRIN  XL) 300 MG 24 hr tablet TAKE 1 TABLET BY MOUTH EVERY DAY IN THE MORNING 04/18/20   Pucilowski, Olgierd A, MD  busPIRone (BUSPAR) 10 MG tablet Take 10 mg by mouth 2 (two) times daily. 10/22/21   [provider]  ciprofloxacin  (CIPRO ) 500 MG tablet Take 1 tablet (500 mg total) by mouth every 12 (twelve) hours. 11/08/21   Henderly, Britni A, PA-C  cyproheptadine  (PERIACTIN ) 4 MG tablet TAKE 1 TABLET 1-2 HOURS  BEFORE SEXUAL ACTIVITY DAILY AS NEEDED 03/22/19   Pucilowski, Olgierd A, MD  dicyclomine  (BENTYL ) 20 MG tablet Take 1 tablet (20 mg total) by mouth 2 (two) times daily. 11/08/21   Henderly, Britni A, PA-C  DULoxetine  (CYMBALTA ) 60 MG capsule Take 2 capsules (120 mg total) by mouth daily. 04/18/20   Pucilowski, Olgierd A, MD  EVENING PRIMROSE OIL PO Take 1,300 mg by mouth daily.    [provider]  fluconazole  (DIFLUCAN ) 200 MG tablet Take 1 tablet when filling the prescription.  If you develop persistent symptoms after 3 days take additional tablet 11/08/21   Henderly, Britni A, PA-C  fluticasone  (FLONASE ) 50 MCG/ACT nasal spray Place 2 sprays into both nostrils daily. 10/31/21   Henderly, Britni A, PA-C  hydrOXYzine  (ATARAX ) 50 MG tablet Take 50 mg by mouth at bedtime. 10/22/21   [provider]  metroNIDAZOLE  (FLAGYL ) 500 MG tablet Take 1 tablet (500 mg total) by mouth 2 (two) times daily. 11/08/21   Henderly, Britni A, PA-C  ondansetron  (ZOFRAN ) 4 MG tablet Take 1 tablet (4 mg total) by mouth every 6 (six) hours. 11/08/21   Henderly, Britni A, PA-C  oxyCODONE  (ROXICODONE ) 5 MG immediate release tablet Take 1 tablet (5 mg total) by mouth every 4 (four) hours as needed for severe pain. 11/08/21   Henderly, Britni A, PA-C  prazosin (MINIPRESS) 2 MG capsule Take 2  mg by mouth at bedtime. 10/22/21   [provider]  VRAYLAR 3 MG capsule Take 3 mg by mouth daily. 10/22/21   [provider]  zolpidem  (AMBIEN  CR) 12.5 MG CR tablet Take 1 tablet (12.5 mg total) by mouth at bedtime as needed for sleep. 01/18/20 07/16/20  Pucilowski, Robynn LABOR, MD    Allergies: Metformin and related    Review of Systems  Updated Vital Signs BP (!) 160/99   Pulse 100   Temp 98.3 F (36.8 C)   Resp 18   SpO2 100%   Physical Exam Constitutional:      General: She is not in acute distress.    Appearance: She is obese.  HENT:     Head: Normocephalic and atraumatic.  Eyes:      Conjunctiva/sclera: Conjunctivae normal.     Pupils: Pupils are equal, round, and reactive to light.  Cardiovascular:     Rate and Rhythm: Normal rate and regular rhythm.  Pulmonary:     Effort: Pulmonary effort is normal. No respiratory distress.  Abdominal:     General: There is no distension.     Tenderness: There is abdominal tenderness in the left lower quadrant.  Skin:    General: Skin is warm and dry.  Neurological:     General: No focal deficit present.     Mental Status: She is alert. Mental status is at baseline.  Psychiatric:        Mood and Affect: Mood normal.        Behavior: Behavior normal.     (all labs ordered are listed, but only abnormal results are displayed) Labs Reviewed  COMPREHENSIVE METABOLIC PANEL WITH GFR - Abnormal; Notable for the following components:      Result Value   Glucose, Bld 136 (*)    Total Protein 8.2 (*)    All other components within normal limits  LIPASE, BLOOD  CBC    EKG: None  Radiology: CT ABDOMEN PELVIS WO CONTRAST Result Date: 09/02/2023 CLINICAL DATA:  Left lower quadrant pain for 1 week, initial encounter EXAM: CT ABDOMEN AND PELVIS WITHOUT CONTRAST TECHNIQUE: Multidetector CT imaging of the abdomen and pelvis was performed following the standard protocol without IV contrast. RADIATION DOSE REDUCTION: This exam was performed according to the departmental dose-optimization program which includes automated exposure control, adjustment of the mA and/or kV according to patient size and/or use of iterative reconstruction technique. COMPARISON:  11/08/2021 FINDINGS: Lower chest: No acute abnormality. Hepatobiliary: Fatty infiltration of the liver is noted. The gallbladder is within normal limits. Pancreas: Unremarkable. No pancreatic ductal dilatation or surrounding inflammatory changes. Spleen: Normal in size without focal abnormality. Adrenals/Urinary Tract: Adrenal glands are within normal limits. Kidneys demonstrate a normal  appearance bilaterally. No renal calculi or obstructive changes seen. The bladder is partially distended. Stomach/Bowel: There are diverticular changes in the sigmoid colon identified with some pericolonic inflammatory change which extends inferiorly along the iliopsoas muscle consistent with diverticulitis. No evidence of perforation is noted. The more proximal colon appears within normal limits. The appendix is unremarkable. Small bowel and stomach are within normal limits. Vascular/Lymphatic: No significant vascular findings are present. No enlarged abdominal or pelvic lymph nodes. Reproductive: Essure coils are noted bilaterally. The uterus appears within normal limits. No adnexal mass is seen. Other: No abdominal wall hernia or abnormality. No abdominopelvic ascites. Musculoskeletal: No acute or significant osseous findings. IMPRESSION: Changes consistent with sigmoid diverticulitis without evidence of perforation. No other acute abnormality is noted. Electronically Signed  By: Oneil Devonshire M.D.   On: 09/02/2023 21:03     Procedures   Medications Ordered in the ED  ciprofloxacin  (CIPRO ) tablet 500 mg (500 mg Oral Given 09/02/23 2125)  metroNIDAZOLE  (FLAGYL ) tablet 500 mg (500 mg Oral Given 09/02/23 2125)                                    Medical Decision Making Amount and/or Complexity of Data Reviewed Labs: ordered. Radiology: ordered.  Risk Prescription drug management.   This patient presents to the ED with concern for lower abdominal pain, blood in stool. This involves an extensive number of treatment options, and is a complaint that carries with it a high risk of complications and morbidity.  The differential diagnosis includes diverticulitis vs ureteral colic vs constipation vs other  Co-morbidities that complicate the patient evaluation: hx of diverticulosis  External records from outside source obtained and reviewed including labs from 7/26 noting BV positive, gc chlamydia  negative, UA with neg nitrites and leuks   I ordered and personally interpreted labs.  The pertinent results include:  no emergent findings  I ordered imaging studies including ct abdomen pelvis I independently visualized and interpreted imaging which showed acute diverticulitis I agree with the radiologist interpretation  I ordered medication including cipro  and flagyl  for diverticulitis  I have reviewed the patients home medicines and have made adjustments as needed  Test Considered: doubt PID, ovarian torsion clinically  After the interventions noted above, I reevaluated the patient and found that they have: stayed the same   Dispostion:  After consideration of the diagnostic results and the patients response to treatment, I feel that the patent would benefit from outpatient follow up      Final diagnoses:  Diverticulitis    ED Discharge Orders          Ordered    ciprofloxacin  (CIPRO ) 500 MG tablet  Every 12 hours        09/02/23 2115    metroNIDAZOLE  (FLAGYL ) 500 MG tablet  3 times daily        09/02/23 2115    fluconazole  (DIFLUCAN ) 200 MG tablet  Daily PRN        09/02/23 2115               Cottie Donnice PARAS, MD 09/02/23 2250

## 2023-09-02 NOTE — ED Triage Notes (Signed)
 LLQ sharp pains x 1 week Noted blood when wiping today after BM

## 2023-09-02 NOTE — ED Notes (Signed)
 Pt states she hasn't been able to urinate for hours.

## 2023-09-02 NOTE — ED Notes (Signed)
 Patient notified of need for urine sample to complete eval/assessment. Specimen cup provided and instructions for clean catch given. Patient will notify staff when able to provide
